# Patient Record
Sex: Female | Born: 1975 | Race: Black or African American | Hispanic: No | Marital: Single | Smoking: Never smoker
Health system: Southern US, Community
[De-identification: ages and names within clinical notes are randomized; demographics above are authoritative.]

## PROBLEM LIST (undated history)

## (undated) DIAGNOSIS — B009 Herpesviral infection, unspecified: Secondary | ICD-10-CM

## (undated) DIAGNOSIS — E039 Hypothyroidism, unspecified: Secondary | ICD-10-CM

## (undated) DIAGNOSIS — R202 Paresthesia of skin: Secondary | ICD-10-CM

## (undated) DIAGNOSIS — R001 Bradycardia, unspecified: Secondary | ICD-10-CM

## (undated) DIAGNOSIS — R519 Headache, unspecified: Secondary | ICD-10-CM

## (undated) DIAGNOSIS — J45909 Unspecified asthma, uncomplicated: Secondary | ICD-10-CM

## (undated) DIAGNOSIS — D649 Anemia, unspecified: Secondary | ICD-10-CM

## (undated) DIAGNOSIS — E611 Iron deficiency: Secondary | ICD-10-CM

## (undated) DIAGNOSIS — G43909 Migraine, unspecified, not intractable, without status migrainosus: Secondary | ICD-10-CM

## (undated) DIAGNOSIS — I4891 Unspecified atrial fibrillation: Secondary | ICD-10-CM

## (undated) DIAGNOSIS — R87629 Unspecified abnormal cytological findings in specimens from vagina: Secondary | ICD-10-CM

## (undated) DIAGNOSIS — R51 Headache: Secondary | ICD-10-CM

## (undated) DIAGNOSIS — R000 Tachycardia, unspecified: Secondary | ICD-10-CM

## (undated) DIAGNOSIS — E559 Vitamin D deficiency, unspecified: Secondary | ICD-10-CM

## (undated) HISTORY — DX: Iron deficiency: E61.1

## (undated) HISTORY — DX: Migraine, unspecified, not intractable, without status migrainosus: G43.909

## (undated) HISTORY — DX: Headache: R51

## (undated) HISTORY — DX: Headache, unspecified: R51.9

## (undated) HISTORY — DX: Anemia, unspecified: D64.9

## (undated) HISTORY — PX: OTHER SURGICAL HISTORY: SHX169

## (undated) HISTORY — DX: Hypothyroidism, unspecified: E03.9

## (undated) HISTORY — DX: Vitamin D deficiency, unspecified: E55.9

## (undated) HISTORY — DX: Paresthesia of skin: R20.2

## (undated) HISTORY — DX: Unspecified atrial fibrillation: I48.91

## (undated) HISTORY — DX: Bradycardia, unspecified: R00.1

---

## 2013-05-22 ENCOUNTER — Other Ambulatory Visit: Payer: Self-pay | Admitting: Family Medicine

## 2013-05-22 DIAGNOSIS — N6323 Unspecified lump in the left breast, lower outer quadrant: Secondary | ICD-10-CM

## 2013-05-25 ENCOUNTER — Encounter: Payer: Self-pay | Admitting: Gynecology

## 2013-05-29 ENCOUNTER — Ambulatory Visit
Admission: RE | Admit: 2013-05-29 | Discharge: 2013-05-29 | Disposition: A | Discharge status: Home / Self Care | Payer: PRIVATE HEALTH INSURANCE | Source: Ambulatory Visit | Attending: Family Medicine | Admitting: Family Medicine

## 2013-05-29 DIAGNOSIS — N6323 Unspecified lump in the left breast, lower outer quadrant: Secondary | ICD-10-CM

## 2013-06-05 ENCOUNTER — Other Ambulatory Visit: Payer: Self-pay

## 2013-08-01 ENCOUNTER — Emergency Department (HOSPITAL_BASED_OUTPATIENT_CLINIC_OR_DEPARTMENT_OTHER)
Admission: EM | Admit: 2013-08-01 | Discharge: 2013-08-01 | Disposition: A | Discharge status: Home / Self Care | Payer: PRIVATE HEALTH INSURANCE | Attending: Emergency Medicine | Admitting: Emergency Medicine

## 2013-08-01 ENCOUNTER — Encounter (HOSPITAL_BASED_OUTPATIENT_CLINIC_OR_DEPARTMENT_OTHER): Payer: Self-pay

## 2013-08-01 ENCOUNTER — Emergency Department (HOSPITAL_BASED_OUTPATIENT_CLINIC_OR_DEPARTMENT_OTHER): Payer: PRIVATE HEALTH INSURANCE

## 2013-08-01 DIAGNOSIS — Z8639 Personal history of other endocrine, nutritional and metabolic disease: Secondary | ICD-10-CM | POA: Insufficient documentation

## 2013-08-01 DIAGNOSIS — R002 Palpitations: Secondary | ICD-10-CM

## 2013-08-01 DIAGNOSIS — J45909 Unspecified asthma, uncomplicated: Secondary | ICD-10-CM | POA: Insufficient documentation

## 2013-08-01 DIAGNOSIS — R42 Dizziness and giddiness: Secondary | ICD-10-CM | POA: Insufficient documentation

## 2013-08-01 DIAGNOSIS — Z862 Personal history of diseases of the blood and blood-forming organs and certain disorders involving the immune mechanism: Secondary | ICD-10-CM | POA: Insufficient documentation

## 2013-08-01 HISTORY — DX: Unspecified asthma, uncomplicated: J45.909

## 2013-08-01 LAB — CBC WITH DIFFERENTIAL/PLATELET
Basophils Absolute: 0 10*3/uL (ref 0.0–0.1)
Basophils Relative: 1 % (ref 0–1)
Eosinophils Absolute: 0.1 10*3/uL (ref 0.0–0.7)
Eosinophils Relative: 3 % (ref 0–5)
HCT: 37.7 % (ref 36.0–46.0)
Hemoglobin: 12.7 g/dL (ref 12.0–15.0)
Lymphocytes Relative: 19 % (ref 12–46)
Lymphs Abs: 0.7 10*3/uL (ref 0.7–4.0)
MCH: 30.6 pg (ref 26.0–34.0)
MCHC: 33.7 g/dL (ref 30.0–36.0)
MCV: 90.8 fL (ref 78.0–100.0)
Monocytes Absolute: 0.5 10*3/uL (ref 0.1–1.0)
Monocytes Relative: 14 % — ABNORMAL HIGH (ref 3–12)
Neutro Abs: 2.1 10*3/uL (ref 1.7–7.7)
Neutrophils Relative %: 63 % (ref 43–77)
Platelets: 271 10*3/uL (ref 150–400)
RBC: 4.15 MIL/uL (ref 3.87–5.11)
RDW: 12.7 % (ref 11.5–15.5)
WBC: 3.4 10*3/uL — ABNORMAL LOW (ref 4.0–10.5)

## 2013-08-01 LAB — BASIC METABOLIC PANEL
BUN: 9 mg/dL (ref 6–23)
CO2: 27 mEq/L (ref 19–32)
Calcium: 9.5 mg/dL (ref 8.4–10.5)
Chloride: 101 mEq/L (ref 96–112)
Creatinine, Ser: 0.8 mg/dL (ref 0.50–1.10)
GFR calc Af Amer: >90 mL/min (ref >90)
GFR calc non Af Amer: >90 mL/min (ref >90)
Glucose, Bld: 83 mg/dL (ref 70–99)
Potassium: 3.8 mEq/L (ref 3.5–5.1)
Sodium: 136 mEq/L (ref 135–145)

## 2013-08-01 LAB — URINALYSIS, ROUTINE W REFLEX MICROSCOPIC
Bilirubin Urine: NEGATIVE
Glucose, UA: NEGATIVE mg/dL
Hgb urine dipstick: NEGATIVE
Ketones, ur: NEGATIVE mg/dL
Leukocytes, UA: NEGATIVE
Nitrite: NEGATIVE
Protein, ur: NEGATIVE mg/dL
Specific Gravity, Urine: 1.006 (ref 1.005–1.030)
Urobilinogen, UA: 0.2 mg/dL (ref 0.0–1.0)
pH: 7 (ref 5.0–8.0)

## 2013-08-01 NOTE — ED Notes (Signed)
MD at bedside. 

## 2013-08-01 NOTE — ED Notes (Signed)
Karen Sofia, PA-C at bedside 

## 2013-08-01 NOTE — ED Provider Notes (Signed)
CSN: [id number]     Arrival date & time 08/01/13  1135 History   First MD Initiated Contact with Patient 08/01/13 1154     Chief Complaint  Patient presents with  . Irregular Heart Beat  . Palpitations   (Consider location/radiation/quality/duration/timing/severity/associated sxs/prior Treatment) HPI Comments: 37 year old female who presents with a complaint of palpitations. She states that she has felt lightheaded for a few days, chief expresses about 3 weeks of dizziness which she describes as feeling like she is in a fog like she is going to lose her balance. She denies vertigo or room spinning, denies syncope and denies visual changes. She has no numbness or weakness of her arms or legs and has no changes in her mental status. Today when she was at work she stood up and felt acute onset of palpitations, this lasted for several minutes and was very uncomfortable stating that she did feel like she had an irregular heartbeat in her neck as well. She was sent here for further evaluation by the nurse at her work. She does report about one hour of intermittent palpitations 2 days ago which resolved spontaneously. She has been worked up in the past by her family doctor with a stress test, a nuclear stress test, Holter monitor testing for 24 hours none of which reveal any significant abnormalities. The patient states that she takes occasional over-the-counter medications and no stimulants, no drugs, no alcohol. She does feel that she has been drinking less fluids recently and may be somewhat dehydrated.  Patient is a 37 y.o. female presenting with palpitations. The history is provided by the patient.  Palpitations   Past Medical History  Diagnosis Date  . Thyroid disease   . Asthma    History reviewed. No pertinent past surgical history. No family history on file. History  Substance Use Topics  . Smoking status: Never Smoker   . Smokeless tobacco: Not on file  . Alcohol Use: Yes     Comment:  occasional   OB History   Grav Para Term Preterm Abortions TAB SAB Ect Mult Living                 Review of Systems  Cardiovascular: Positive for palpitations.  All other systems reviewed and are negative.    Allergies  Review of patient's allergies indicates no known allergies.  Home Medications   Current Outpatient Rx  Name  Route  Sig  Dispense  Refill  . Ascorbic Acid (VITAMIN C PO)   Oral   Take by mouth.         . Multiple Vitamins-Minerals (MULTIVITAMIN PO)   Oral   Take by mouth.          BP 116/63  Pulse 58  Temp(Src) 97.9 F (36.6 C) (Oral)  Resp 18  SpO2 100%  LMP 07/15/2013 Physical Exam  Nursing note and vitals reviewed. Constitutional: She appears well-developed and well-nourished. No distress.  HENT:  Head: Normocephalic and atraumatic.  Mouth/Throat: Oropharynx is clear and moist. No oropharyngeal exudate.  Eyes: Conjunctivae and EOM are normal. Pupils are equal, round, and reactive to light. Right eye exhibits no discharge. Left eye exhibits no discharge. No scleral icterus.  Neck: Normal range of motion. Neck supple. No JVD present. No thyromegaly present.  Cardiovascular: Normal rate, regular rhythm, normal heart sounds and intact distal pulses.  Exam reveals no gallop and no friction rub.   No murmur heard. Pulmonary/Chest: Effort normal and breath sounds normal. No respiratory distress. She has  no wheezes. She has no rales.  Abdominal: Soft. Bowel sounds are normal. She exhibits no distension and no mass. There is no tenderness.  Musculoskeletal: Normal range of motion. She exhibits no edema and no tenderness.  Lymphadenopathy:    She has no cervical adenopathy.  Neurological: She is alert. Coordination normal.  Normal speech, normal strength, normal movements, normal coordination, no nystagmus, normal cranial nerves III through XII  Skin: Skin is warm and dry. No rash noted. No erythema.  Psychiatric: She has a normal mood and affect.  Her behavior is normal.    ED Course  Procedures (including critical care time) Labs Review Labs Reviewed  CBC WITH DIFFERENTIAL - Abnormal; Notable for the following:    WBC 3.4 (*)    Monocytes Relative 14 (*)    All other components within normal limits  BASIC METABOLIC PANEL  URINALYSIS, ROUTINE W REFLEX MICROSCOPIC   Imaging Review Dg Chest 2 View  08/01/2013   *RADIOLOGY REPORT*  Clinical Data: Chest pain and shortness of breath.  History of asthma.  CHEST - 2 VIEW  Comparison: None.  Findings: The heart, mediastinal, and hilar contours are normal. The pulmonary vascularity is normal.  The trachea is midline. There is no pleural effusion or pneumothorax. There is an approximately 20 degrees convex left curvature of the lower thoracic and upper lumbar spine.  No acute osseous abnormality is identified.  IMPRESSION: 1.  No acute cardiopulmonary disease. 2.  20 degrees convex left scoliotic curvature of the lower thoracic/upper lumbar spine.   Original Report Authenticated By: Britta Mccreedy, M.D.    MDM   1. Palpitations    Overall the patient is very well-appearing from her EKG shows normal sinus rhythm without any acute findings, she has a history of no abnormalities with obstructive disease to her heart, the etiology of her palpitations is unclear at this time though I would consider elective abnormalities, dehydration which would also account for sense of lightheadedness when she stands.  ED ECG REPORT  I personally interpreted this EKG   Date: 08/01/2013   Rate: 57  Rhythm: sinus bradycardia  QRS Axis: normal  Intervals: normal  ST/T Wave abnormalities: normal  Conduction Disutrbances:none  Narrative Interpretation:   Old EKG Reviewed: none available  No more palpitations since arrival, lab work and chest x-ray reviewed without any acute findings to suggest a source. Patient stable at this time, will be discharged safely to follow up for Holter monitor  testing.     Vida Roller, MD 08/01/13 [phone]

## 2013-08-01 NOTE — ED Notes (Signed)
Pt reports feeling "light headed" for a few days, stood up at work and developed palpitations and an irregular heart beat.  Seen by nurse at work and sent to ED for evaluation.

## 2013-08-01 NOTE — ED Notes (Signed)
Family at bedside. 

## 2013-09-21 ENCOUNTER — Other Ambulatory Visit (HOSPITAL_COMMUNITY): Payer: Self-pay | Admitting: Cardiology

## 2013-09-21 DIAGNOSIS — R079 Chest pain, unspecified: Secondary | ICD-10-CM

## 2013-09-25 ENCOUNTER — Ambulatory Visit (HOSPITAL_COMMUNITY): Payer: PRIVATE HEALTH INSURANCE | Attending: Cardiology

## 2013-09-25 DIAGNOSIS — R072 Precordial pain: Secondary | ICD-10-CM | POA: Insufficient documentation

## 2013-09-25 DIAGNOSIS — I079 Rheumatic tricuspid valve disease, unspecified: Secondary | ICD-10-CM | POA: Insufficient documentation

## 2013-09-25 DIAGNOSIS — R0989 Other specified symptoms and signs involving the circulatory and respiratory systems: Secondary | ICD-10-CM | POA: Insufficient documentation

## 2013-09-25 DIAGNOSIS — I379 Nonrheumatic pulmonary valve disorder, unspecified: Secondary | ICD-10-CM | POA: Insufficient documentation

## 2013-09-25 DIAGNOSIS — R079 Chest pain, unspecified: Secondary | ICD-10-CM

## 2013-09-25 DIAGNOSIS — I059 Rheumatic mitral valve disease, unspecified: Secondary | ICD-10-CM | POA: Insufficient documentation

## 2013-09-25 DIAGNOSIS — R0609 Other forms of dyspnea: Secondary | ICD-10-CM | POA: Insufficient documentation

## 2013-09-25 DIAGNOSIS — R000 Tachycardia, unspecified: Secondary | ICD-10-CM | POA: Insufficient documentation

## 2013-09-25 NOTE — Progress Notes (Signed)
Echocardiogram performed.  

## 2013-09-26 ENCOUNTER — Encounter: Payer: Self-pay | Admitting: *Deleted

## 2013-09-26 ENCOUNTER — Encounter: Payer: Self-pay | Admitting: Cardiology

## 2013-09-26 DIAGNOSIS — E611 Iron deficiency: Secondary | ICD-10-CM | POA: Insufficient documentation

## 2013-09-26 DIAGNOSIS — E039 Hypothyroidism, unspecified: Secondary | ICD-10-CM | POA: Insufficient documentation

## 2013-09-26 DIAGNOSIS — D649 Anemia, unspecified: Secondary | ICD-10-CM | POA: Insufficient documentation

## 2013-09-27 ENCOUNTER — Encounter: Payer: Self-pay | Admitting: Cardiology

## 2013-09-28 ENCOUNTER — Ambulatory Visit (INDEPENDENT_AMBULATORY_CARE_PROVIDER_SITE_OTHER): Payer: PRIVATE HEALTH INSURANCE | Admitting: Cardiology

## 2013-09-28 ENCOUNTER — Encounter: Payer: Self-pay | Admitting: Cardiology

## 2013-09-28 VITALS — BP 108/64 | HR 60 | Ht 67.0 in | Wt 160.8 lb

## 2013-09-28 DIAGNOSIS — I4949 Other premature depolarization: Secondary | ICD-10-CM

## 2013-09-28 DIAGNOSIS — I493 Ventricular premature depolarization: Secondary | ICD-10-CM | POA: Insufficient documentation

## 2013-09-28 NOTE — Patient Instructions (Signed)
Your physician recommends that you continue on your current medications as directed. Please refer to the Current Medication list given to you today.  Your physician wants you to follow-up in: 6 months with Dr Turner You will receive a reminder letter in the mail two months in advance. If you don't receive a letter, please call our office to schedule the follow-up appointment.  

## 2013-09-28 NOTE — Progress Notes (Signed)
  [address] [place], [place]  [address] Phone: [phone] Fax:  [phone]  Date:  09/28/2013   ID:  Greenlee Ancheta, DOB 02/12/1976, MRN [id number]  PCP:  No primary provider on file.  Cardiologist:  Armanda Magic, MD     History of Present Illness: Janice Flores is a 37 y.o. female with a recent history of palpitations and SOB as well as chest tightness during the palpitations.  She underwent ETT which showed no ischemia at 12 mets with normal BP.  Her 24 hour Holter showed PVC's and she was started on Toprol which she has only been taking on a PRN basis and it has helped the 2 times she took.  The lifewatch monitor after starting Toprol has shown NSR with sinus arrhythmia.  She presents back today for followup.    Wt Readings from Last 3 Encounters:  09/28/13 160 lb 12.8 oz (72.938 kg)     Past Medical History  Diagnosis Date  . Hypothyroidism   . Asthma   . Iron deficiency   . Vitamin D deficiency   . Anemia     Current Outpatient Prescriptions  Medication Sig Dispense Refill  . albuterol (PROVENTIL HFA;VENTOLIN HFA) 108 (90 BASE) MCG/ACT inhaler Inhale 2 puffs into the lungs every 6 (six) hours as needed for wheezing.      Marland Kitchen levothyroxine (SYNTHROID, LEVOTHROID) 50 MCG tablet Take 50 mcg by mouth daily before breakfast.      . meclizine (ANTIVERT) 25 MG tablet Take 25 mg by mouth 3 (three) times daily as needed.      . metoprolol succinate (TOPROL-XL) 25 MG 24 hr tablet 1/2 tab po only as needed      . Multiple Vitamins-Minerals (MULTIVITAMIN PO) Take by mouth.      . Vitamin D, Ergocalciferol, (DRISDOL) 50000 UNITS CAPS capsule Take 50,000 Units by mouth. Twice a week       No current facility-administered medications for this visit.    Allergies:   No Known Allergies  Social History:  The patient  reports that she has never smoked. She does not have any smokeless tobacco history on file. She reports that she drinks alcohol. She reports that she does not  use illicit drugs.   Family History:  The patient's family history includes Asthma in her brother; Hypercholesterolemia in her mother; Hypertension in her father; Sarcoidosis in her mother.   ROS:  Please see the history of present illness.      All other systems reviewed and negative.    ASSESSMENT AND PLAN:  1. Palpitations with heart monitor showing NSR with sinus arrhythmia and 24 hour holter with PVC's  - continue Toprol on as as needed basis 2. 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 PVC's - continue Toprol PRN - 2D echo with normal LVF.  We discussed triggers of PVC's including fatigue, stress, caffeine and ETOH.  Followup with me in 6 months  Signed, Armanda Magic, MD 09/28/2013 4:38 PM

## 2013-10-05 DIAGNOSIS — R002 Palpitations: Secondary | ICD-10-CM

## 2013-11-07 ENCOUNTER — Other Ambulatory Visit: Payer: Self-pay | Admitting: Family Medicine

## 2013-11-07 DIAGNOSIS — N6320 Unspecified lump in the left breast, unspecified quadrant: Secondary | ICD-10-CM

## 2013-12-04 ENCOUNTER — Ambulatory Visit
Admission: RE | Admit: 2013-12-04 | Discharge: 2013-12-04 | Disposition: A | Discharge status: Home / Self Care | Payer: PRIVATE HEALTH INSURANCE | Source: Ambulatory Visit | Attending: Family Medicine | Admitting: Family Medicine

## 2013-12-04 DIAGNOSIS — N6320 Unspecified lump in the left breast, unspecified quadrant: Secondary | ICD-10-CM

## 2014-03-21 ENCOUNTER — Encounter: Payer: Self-pay | Admitting: Cardiology

## 2014-03-22 ENCOUNTER — Encounter: Payer: Self-pay | Admitting: Cardiology

## 2014-03-22 ENCOUNTER — Encounter: Payer: Self-pay | Admitting: Physician Assistant

## 2014-04-26 ENCOUNTER — Other Ambulatory Visit: Payer: Self-pay | Admitting: Family Medicine

## 2014-04-26 DIAGNOSIS — N6320 Unspecified lump in the left breast, unspecified quadrant: Secondary | ICD-10-CM

## 2014-04-26 DIAGNOSIS — D249 Benign neoplasm of unspecified breast: Secondary | ICD-10-CM

## 2014-05-30 ENCOUNTER — Ambulatory Visit
Admission: RE | Admit: 2014-05-30 | Discharge: 2014-05-30 | Disposition: A | Discharge status: Home / Self Care | Payer: PRIVATE HEALTH INSURANCE | Source: Ambulatory Visit | Attending: Family Medicine | Admitting: Family Medicine

## 2014-05-30 DIAGNOSIS — D249 Benign neoplasm of unspecified breast: Secondary | ICD-10-CM

## 2014-05-30 DIAGNOSIS — N6320 Unspecified lump in the left breast, unspecified quadrant: Secondary | ICD-10-CM

## 2014-09-06 LAB — OB RESULTS CONSOLE RPR: RPR: NONREACTIVE

## 2014-09-06 LAB — OB RESULTS CONSOLE GC/CHLAMYDIA
Chlamydia: NEGATIVE
Gonorrhea: NEGATIVE

## 2014-09-06 LAB — OB RESULTS CONSOLE ANTIBODY SCREEN: Antibody Screen: NEGATIVE

## 2014-09-06 LAB — OB RESULTS CONSOLE HEPATITIS B SURFACE ANTIGEN: Hepatitis B Surface Ag: NEGATIVE

## 2014-09-06 LAB — OB RESULTS CONSOLE ABO/RH: RH Type: POSITIVE

## 2014-09-06 LAB — OB RESULTS CONSOLE RUBELLA ANTIBODY, IGM: Rubella: IMMUNE

## 2014-09-06 LAB — OB RESULTS CONSOLE HIV ANTIBODY (ROUTINE TESTING): HIV: NONREACTIVE

## 2014-09-26 ENCOUNTER — Other Ambulatory Visit: Payer: Self-pay | Admitting: Obstetrics and Gynecology

## 2014-09-26 ENCOUNTER — Other Ambulatory Visit (HOSPITAL_COMMUNITY)
Admission: RE | Admit: 2014-09-26 | Discharge: 2014-09-26 | Disposition: A | Discharge status: Home / Self Care | Payer: Commercial Managed Care - PPO | Source: Ambulatory Visit | Attending: Obstetrics and Gynecology | Admitting: Obstetrics and Gynecology

## 2014-09-26 DIAGNOSIS — Z113 Encounter for screening for infections with a predominantly sexual mode of transmission: Secondary | ICD-10-CM | POA: Insufficient documentation

## 2014-09-26 DIAGNOSIS — Z1151 Encounter for screening for human papillomavirus (HPV): Secondary | ICD-10-CM | POA: Insufficient documentation

## 2014-09-26 DIAGNOSIS — Z01411 Encounter for gynecological examination (general) (routine) with abnormal findings: Secondary | ICD-10-CM | POA: Diagnosis present

## 2014-09-28 LAB — CYTOLOGY - PAP

## 2015-01-16 ENCOUNTER — Encounter: Payer: Self-pay | Admitting: Physician Assistant

## 2015-01-16 ENCOUNTER — Ambulatory Visit (INDEPENDENT_AMBULATORY_CARE_PROVIDER_SITE_OTHER): Payer: Commercial Managed Care - PPO | Admitting: Physician Assistant

## 2015-01-16 VITALS — BP 115/68 | HR 92 | Wt 191.6 lb

## 2015-01-16 DIAGNOSIS — I493 Ventricular premature depolarization: Secondary | ICD-10-CM

## 2015-01-16 DIAGNOSIS — R0602 Shortness of breath: Secondary | ICD-10-CM | POA: Diagnosis not present

## 2015-01-16 NOTE — Patient Instructions (Signed)
Your physician recommends that you continue on your current medications as directed. Please refer to the Current Medication list given to you today.  Your physician has requested that you have an echocardiogram. Echocardiography is a painless test that uses sound waves to create images of your heart. It provides your doctor with information about the size and shape of your heart and how well your heart's chambers and valves are working. This procedure takes approximately one hour. There are no restrictions for this procedure.  Your physician recommends that you schedule a follow-up appointment in: 4 to 6 weeks with Dr. Radford Pax.

## 2015-01-16 NOTE — Progress Notes (Signed)
Cardiology Office Note   Date:  01/16/2015   ID:  Janice Flores, Janice Flores 09/23/1976, MRN [id number]  PCP:  Lynne Logan, MD  Cardiologist:  Dr. Radford Pax    SOB     History of Present Illness: Janice Flores is a 39 y.o. female with a history of asthma, hypothyroidism, anemia and palpations who was added on to my office schedule today for evaluation of SOB.   She was last seen in the office by Dr. Radford Pax in 09/2013 for evaluation of palpitations and SOB as well as chest tightness during the palpitations. She underwent ETT which showed no ischemia at 12 mets with normal BP. Her 24 hour Holter showed PVC's and she was started on Toprol which she has only been taking on a PRN basis and was helpful. The lifewatch monitor after starting Toprol has shown NSR with sinus arrhythmia.   She had not needed to take Toprol since last summer. However, she did have one episode of palpations in late September but decided not to take it as she was pregnant. The patient is now [redacted] weeks pregnant and was sent over from her OB/GYN for evaluation of SOB. She notices it with very light exertion and when laying on her left side to sleep. She is also having palpitations over the past 2 weeks about 5-6 times. No Le edema, PND or orthopnea. No dizziness or lightheadedness or dizziness. No blood in her stool or urine. No recent fevers, chills, night sweats, or illnesses. Still with some morning sickness that has gotten a little better. She takes Diclegis ( combo of unisom and vit B6)  She does not drink any alcohol or caffeine.    Past Medical History  Diagnosis Date  . Hypothyroidism   . Asthma   . Iron deficiency   . Vitamin D deficiency   . Anemia     Past Surgical History  Procedure Laterality Date  . Removal of polyp from uterus       Current Outpatient Prescriptions  Medication Sig Dispense Refill  . albuterol (PROVENTIL HFA;VENTOLIN HFA) 108 (90 BASE) MCG/ACT inhaler Inhale 2 puffs into the lungs every  6 (six) hours as needed for wheezing.    Marland Kitchen levothyroxine (SYNTHROID, LEVOTHROID) 50 MCG tablet Take 50 mcg by mouth daily before breakfast.    . Multiple Vitamins-Minerals (MULTIVITAMIN PO) Take 1 tablet by mouth daily.      No current facility-administered medications for this visit.    Allergies:   Review of patient's allergies indicates no known allergies.    Social History:  The patient  reports that she has never smoked. She does not have any smokeless tobacco history on file. She reports that she drinks alcohol. She reports that she does not use illicit drugs.   Family History:  The patient's family history includes Asthma in her brother; Hypercholesterolemia in her mother; Hypertension in her father; Sarcoidosis in her mother.    ROS:  Please see the history of present illness.  All other systems are reviewed and negative.    PHYSICAL EXAM: VS:  BP 115/68 mmHg  Pulse 92  Wt 191 lb 9.6 oz (86.909 kg)  SpO2 99% , BMI Body mass index is 30 kg/(m^2). GEN: Well nourished, well developed, in no acute distress HEENT: normal Neck: no JVD, carotid bruits, or masses Cardiac: RRR; no murmurs, rubs, or gallops,no edema  Respiratory:  clear to auscultation bilaterally, normal work of breathing GI: soft, nontender, nondistended, + BS MS: no deformity or atrophy Skin:  warm and dry, no rash Neuro:  Strength and sensation are intact Psych: euthymic mood, full affect   EKG:  EKG is ordered today. The ekg ordered today demonstrates NSR HR 92. No PVCS   Recent Labs: No results found for requested labs within last 365 days.    Lipid Panel No results found for: CHOL, TRIG, HDL, CHOLHDL, VLDL, LDLCALC, LDLDIRECT    Wt Readings from Last 3 Encounters:  01/16/15 191 lb 9.6 oz (86.909 kg)  09/28/13 160 lb 12.8 oz (72.938 kg)      Other studies Reviewed: Additional studies/ records that were reviewed today include: 2D ECHO, 24 hours holter Review of the above records demonstrates:  normal LV function and PVCs.   ASSESSMENT AND PLAN:  Janice Flores is a 39 y.o. female with a history of asthma, hypothyroidism, anemia and palpations who was added on to my office schedule today for evaluation of SOB.  SOB- related to exertion and palpitations.  -- 2D echo with normal LVF in 2014. Will get repeat ECHO with return of SOB and current pregnancy to rule out new cardiomyopathy. No s/s CHF.  PVC's - was previously on Toprol PRN but this has been discontinued due to current pregnancy -- She has had return of palpations and SOB but chooses not to take metoprolol XL as she is pregnant and the symptoms are not that bothersome. -- We discussed triggers of PVC's including fatigue, stress, caffeine and ETOH.  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   Current medicines are reviewed at length with the patient today.  The patient does not have concerns regarding medicines.  The following changes have been made:  no change  Labs/ tests ordered today include: 2D ECHO   No orders of the defined types were placed in this encounter.     Disposition:   FU with Dr. Radford Pax in 4-6 weeks.   Renea Ee  01/16/2015 3:18 PM    [facility] [place], [place], [place]  [address] Phone: [phone]; Fax: [phone]

## 2015-01-21 ENCOUNTER — Ambulatory Visit (HOSPITAL_COMMUNITY): Payer: Commercial Managed Care - PPO | Attending: Cardiovascular Disease | Admitting: Cardiology

## 2015-01-21 DIAGNOSIS — R0602 Shortness of breath: Secondary | ICD-10-CM | POA: Diagnosis present

## 2015-01-21 NOTE — Progress Notes (Signed)
Echo performed. 

## 2015-02-21 NOTE — Progress Notes (Signed)
Cardiology Office Note   Date:  02/22/2015   ID:  Janice Flores, DOB [birth year], MRN [id number]  PCP:  Janice Logan, MD  Cardiologist:   Janice Margarita, MD   Chief Complaint  Patient presents with  . Palpitations      History of Present Illness: Janice Flores is a 39 y.o. female with a  history of palpitations and SOB as well as chest tightness during the palpitations. She underwent ETT which showed no ischemia at 12 mets with normal BP. Her 24 hour Holter showed PVC's and she was started on Toprol which she only  takes on a PRN basis. The lifewatch monitor after starting Toprol showed NSR with sinus arrhythmia. She presents back today for followup.  She saw my PA a few weeks ago due to SOB.  She is now [redacted] weeks pregnant.  The SOB was noticeable with light exertion and when laying on her left side to sleep.  She has also been having some palpitations.  She continues to have the palpitations.  2D echo was completely normal.  She anemic and is taking iron suppl.  She is not drinking any caffeine.  She says that the SOB is about the same with walking and gets up from sitting.  She denies any LE edema.     Past Medical History  Diagnosis Date  . Hypothyroidism   . Asthma   . Iron deficiency   . Vitamin D deficiency   . Anemia     Past Surgical History  Procedure Laterality Date  . Removal of polyp from uterus       Current Outpatient Prescriptions  Medication Sig Dispense Refill  . albuterol (PROVENTIL HFA;VENTOLIN HFA) 108 (90 BASE) MCG/ACT inhaler Inhale 2 puffs into the lungs every 6 (six) hours as needed for wheezing.    Marland Kitchen levothyroxine (SYNTHROID, LEVOTHROID) 75 MCG tablet Take 75 mcg by mouth every morning.  3  . Multiple Vitamins-Minerals (MULTIVITAMIN PO) Take 1 tablet by mouth daily.      No current facility-administered medications for this visit.    Allergies:   Review of patient's allergies indicates no known allergies.    Social History:  The patient   reports that she has never smoked. She does not have any smokeless tobacco history on file. She reports that she drinks alcohol. She reports that she does not use illicit drugs.   Family History:  The patient's family history includes Asthma in her brother; Hypercholesterolemia in her mother; Hypertension in her father; Sarcoidosis in her mother.    ROS:  Please see the history of present illness.   Otherwise, review of systems are positive for none.   All other systems are reviewed and negative.    PHYSICAL EXAM: VS:  BP 118/60 mmHg  Pulse 95  Ht 5\' 7"  (1.702 m)  Wt 196 lb (88.905 kg)  BMI 30.69 kg/m2 , BMI Body mass index is 30.69 kg/(m^2). GEN: Well nourished, well developed, in no acute distress HEENT: normal Neck: no JVD, carotid bruits, or masses Cardiac: RRR; no murmurs, rubs, or gallops,no edema  Respiratory:  clear to auscultation bilaterally, normal work of breathing GI: soft, nontender, nondistended, + BS MS: no deformity or atrophy Skin: warm and dry, no rash Neuro:  Strength and sensation are intact Psych: euthymic mood, full affect   EKG:  EKG is not ordered today.    Recent Labs: No results found for requested labs within last 365 days.    Lipid Panel No results  found for: CHOL, TRIG, HDL, CHOLHDL, VLDL, LDLCALC, LDLDIRECT    Wt Readings from Last 3 Encounters:  02/22/15 196 lb (88.905 kg)  01/16/15 191 lb 9.6 oz (86.909 kg)  09/28/13 160 lb 12.8 oz (72.938 kg)     ASSESSMENT AND PLAN:    1.       Palpitations - I will get a heart monitor to assess since these feel different from her typical PVC's - had been using Toprol PRN but not now since she is pregnant 2. SOB with normal 2D echo.  I suspect this is a combination of pregnancy and anemia.  No further cardiac workup at this time   3.       PVC's  - 2D echo with normal LVF.  Current medicines are reviewed at length with the patient today.  The patient does not have concerns regarding  medicines.  The following changes have been made:  no change  Labs/ tests ordered today include: None   Orders Placed This Encounter  Procedures  . Cardiac event monitor     Disposition:   FU with me in 3 months   Signed, Janice Margarita, MD  02/22/2015 3:31 PM    [facility] [place], [place], [place]  [address] Phone: [phone]; Fax: [phone]

## 2015-02-22 ENCOUNTER — Encounter: Payer: Self-pay | Admitting: Cardiology

## 2015-02-22 ENCOUNTER — Ambulatory Visit (INDEPENDENT_AMBULATORY_CARE_PROVIDER_SITE_OTHER): Payer: Commercial Managed Care - PPO | Admitting: Cardiology

## 2015-02-22 VITALS — BP 118/60 | HR 95 | Ht 67.0 in | Wt 196.0 lb

## 2015-02-22 DIAGNOSIS — R0602 Shortness of breath: Secondary | ICD-10-CM

## 2015-02-22 DIAGNOSIS — R002 Palpitations: Secondary | ICD-10-CM | POA: Diagnosis not present

## 2015-02-22 DIAGNOSIS — I493 Ventricular premature depolarization: Secondary | ICD-10-CM

## 2015-02-22 NOTE — Patient Instructions (Addendum)
Your physician has recommended that you wear an event monitor. Event monitors are medical devices that record the heart's electrical activity. Doctors most often [place] these monitors to diagnose arrhythmias. Arrhythmias are problems with the speed or rhythm of the heartbeat. The monitor is a small, portable device. You can wear one while you do your normal daily activities. This is usually used to diagnose what is causing palpitations/syncope (passing out).  Your physician recommends that you schedule a follow-up appointment in: 3 months with Dr. Radford Pax.

## 2015-02-26 ENCOUNTER — Encounter: Payer: Self-pay | Admitting: Radiology

## 2015-02-26 ENCOUNTER — Encounter (INDEPENDENT_AMBULATORY_CARE_PROVIDER_SITE_OTHER): Payer: Commercial Managed Care - PPO

## 2015-02-26 DIAGNOSIS — R002 Palpitations: Secondary | ICD-10-CM

## 2015-02-26 DIAGNOSIS — I493 Ventricular premature depolarization: Secondary | ICD-10-CM

## 2015-02-26 NOTE — Progress Notes (Signed)
Patient ID: Janice Flores, female   DOB: 28-Mar-1976, 39 y.o.   MRN: [id number] Lifewatch 30 day monitor applied. EOS 03-28-15

## 2015-03-19 LAB — OB RESULTS CONSOLE GBS: GBS: POSITIVE

## 2015-04-02 ENCOUNTER — Telehealth: Payer: Self-pay | Admitting: Cardiology

## 2015-04-02 NOTE — Telephone Encounter (Signed)
Please let patient know that heart monitor showed NSR and sinus tachycardia up to 108 bpm with PVC's which are benign.

## 2015-04-04 NOTE — Telephone Encounter (Signed)
Instructed patient to HOLD medication if she is planning to breastfeed. Patient grateful for callback and agrees with treatment plan.

## 2015-04-04 NOTE — Telephone Encounter (Signed)
Would hold off if she is planning to breast feed

## 2015-04-04 NOTE — Telephone Encounter (Signed)
Informed patient of results and verbal understanding expressed.   Patient st she still has seldom palpitations. She has not taken her metoprolol since last summer. She wants to know if it is OK to resume PRN metoprolol for symptoms once the baby arrives.   To Dr. Radford Pax.

## 2015-04-17 ENCOUNTER — Telehealth (HOSPITAL_COMMUNITY): Payer: Self-pay | Admitting: *Deleted

## 2015-04-17 ENCOUNTER — Encounter (HOSPITAL_COMMUNITY): Payer: Self-pay | Admitting: *Deleted

## 2015-04-17 NOTE — Telephone Encounter (Signed)
Preadmission screen  

## 2015-04-21 ENCOUNTER — Encounter (HOSPITAL_COMMUNITY): Payer: Self-pay | Admitting: Obstetrics and Gynecology

## 2015-04-21 ENCOUNTER — Inpatient Hospital Stay (HOSPITAL_COMMUNITY)
Admission: AD | Admit: 2015-04-21 | Discharge: 2015-04-22 | DRG: 765 | Disposition: A | Discharge status: Home / Self Care | Payer: Commercial Managed Care - PPO | Source: Ambulatory Visit | Attending: Obstetrics and Gynecology | Admitting: Obstetrics and Gynecology

## 2015-04-21 ENCOUNTER — Inpatient Hospital Stay (HOSPITAL_COMMUNITY): Payer: Commercial Managed Care - PPO | Admitting: Anesthesiology

## 2015-04-21 ENCOUNTER — Inpatient Hospital Stay (HOSPITAL_COMMUNITY): Admission: RE | Admit: 2015-04-21 | Payer: Commercial Managed Care - PPO | Source: Ambulatory Visit

## 2015-04-21 ENCOUNTER — Encounter (HOSPITAL_COMMUNITY)
Admission: AD | Disposition: A | Discharge status: Home / Self Care | Payer: Self-pay | Source: Ambulatory Visit | Attending: Obstetrics and Gynecology

## 2015-04-21 DIAGNOSIS — R0602 Shortness of breath: Secondary | ICD-10-CM

## 2015-04-21 DIAGNOSIS — D509 Iron deficiency anemia, unspecified: Secondary | ICD-10-CM | POA: Diagnosis present

## 2015-04-21 DIAGNOSIS — E039 Hypothyroidism, unspecified: Secondary | ICD-10-CM | POA: Diagnosis present

## 2015-04-21 DIAGNOSIS — O99284 Endocrine, nutritional and metabolic diseases complicating childbirth: Secondary | ICD-10-CM | POA: Diagnosis present

## 2015-04-21 DIAGNOSIS — O9942 Diseases of the circulatory system complicating childbirth: Secondary | ICD-10-CM | POA: Diagnosis present

## 2015-04-21 DIAGNOSIS — O09513 Supervision of elderly primigravida, third trimester: Secondary | ICD-10-CM | POA: Diagnosis not present

## 2015-04-21 DIAGNOSIS — O9832 Other infections with a predominantly sexual mode of transmission complicating childbirth: Secondary | ICD-10-CM | POA: Diagnosis present

## 2015-04-21 DIAGNOSIS — O9902 Anemia complicating childbirth: Secondary | ICD-10-CM | POA: Diagnosis present

## 2015-04-21 DIAGNOSIS — O9989 Other specified diseases and conditions complicating pregnancy, childbirth and the puerperium: Secondary | ICD-10-CM | POA: Diagnosis present

## 2015-04-21 DIAGNOSIS — B951 Streptococcus, group B, as the cause of diseases classified elsewhere: Secondary | ICD-10-CM | POA: Insufficient documentation

## 2015-04-21 DIAGNOSIS — O09519 Supervision of elderly primigravida, unspecified trimester: Secondary | ICD-10-CM | POA: Insufficient documentation

## 2015-04-21 DIAGNOSIS — Z3A40 40 weeks gestation of pregnancy: Secondary | ICD-10-CM | POA: Diagnosis present

## 2015-04-21 DIAGNOSIS — O99824 Streptococcus B carrier state complicating childbirth: Secondary | ICD-10-CM | POA: Diagnosis present

## 2015-04-21 DIAGNOSIS — R002 Palpitations: Secondary | ICD-10-CM

## 2015-04-21 DIAGNOSIS — E559 Vitamin D deficiency, unspecified: Secondary | ICD-10-CM | POA: Diagnosis present

## 2015-04-21 DIAGNOSIS — A6000 Herpesviral infection of urogenital system, unspecified: Secondary | ICD-10-CM | POA: Insufficient documentation

## 2015-04-21 DIAGNOSIS — I493 Ventricular premature depolarization: Secondary | ICD-10-CM | POA: Diagnosis present

## 2015-04-21 DIAGNOSIS — R000 Tachycardia, unspecified: Secondary | ICD-10-CM | POA: Diagnosis present

## 2015-04-21 DIAGNOSIS — O480 Post-term pregnancy: Secondary | ICD-10-CM | POA: Diagnosis present

## 2015-04-21 DIAGNOSIS — Z98891 History of uterine scar from previous surgery: Secondary | ICD-10-CM

## 2015-04-21 DIAGNOSIS — E611 Iron deficiency: Secondary | ICD-10-CM

## 2015-04-21 HISTORY — DX: Herpesviral infection, unspecified: B00.9

## 2015-04-21 HISTORY — DX: Tachycardia, unspecified: R00.0

## 2015-04-21 HISTORY — DX: Unspecified abnormal cytological findings in specimens from vagina: R87.629

## 2015-04-21 LAB — CBC
HCT: 34.3 % — ABNORMAL LOW (ref 36.0–46.0)
HCT: 30.3 % — ABNORMAL LOW (ref 36.0–46.0)
Hemoglobin: 12.3 g/dL (ref 12.0–15.0)
Hemoglobin: 10.5 g/dL — ABNORMAL LOW (ref 12.0–15.0)
MCH: 32.5 pg (ref 26.0–34.0)
MCH: 31.5 pg (ref 26.0–34.0)
MCHC: 35.9 g/dL (ref 30.0–36.0)
MCHC: 34.7 g/dL (ref 30.0–36.0)
MCV: 90.5 fL (ref 78.0–100.0)
MCV: 91 fL (ref 78.0–100.0)
Platelets: 310 10*3/uL (ref 150–400)
Platelets: 251 10*3/uL (ref 150–400)
RBC: 3.79 MIL/uL — ABNORMAL LOW (ref 3.87–5.11)
RBC: 3.33 MIL/uL — ABNORMAL LOW (ref 3.87–5.11)
RDW: 15.8 % — ABNORMAL HIGH (ref 11.5–15.5)
RDW: 15.8 % — ABNORMAL HIGH (ref 11.5–15.5)
WBC: 8.6 10*3/uL (ref 4.0–10.5)
WBC: 12.4 10*3/uL — ABNORMAL HIGH (ref 4.0–10.5)

## 2015-04-21 LAB — COMPREHENSIVE METABOLIC PANEL
ALT: 13 U/L — ABNORMAL LOW (ref 14–54)
AST: 21 U/L (ref 15–41)
Albumin: 2.7 g/dL — ABNORMAL LOW (ref 3.5–5.0)
Alkaline Phosphatase: 209 U/L — ABNORMAL HIGH (ref 38–126)
Anion gap: 10 (ref 5–15)
BUN: 6 mg/dL (ref 6–20)
CO2: 23 mmol/L (ref 22–32)
Calcium: 8.2 mg/dL — ABNORMAL LOW (ref 8.9–10.3)
Chloride: 102 mmol/L (ref 101–111)
Creatinine, Ser: 0.76 mg/dL (ref 0.44–1.00)
GFR calc Af Amer: >60 mL/min (ref >60)
GFR calc non Af Amer: >60 mL/min (ref >60)
Glucose, Bld: 90 mg/dL (ref 65–99)
Potassium: 3.5 mmol/L (ref 3.5–5.1)
Sodium: 135 mmol/L (ref 135–145)
Total Bilirubin: 0.5 mg/dL (ref 0.3–1.2)
Total Protein: 6 g/dL — ABNORMAL LOW (ref 6.5–8.1)

## 2015-04-21 LAB — TYPE AND SCREEN
ABO/RH(D): B POS
Antibody Screen: NEGATIVE

## 2015-04-21 LAB — LACTATE DEHYDROGENASE: LDH: 119 U/L (ref 98–192)

## 2015-04-21 LAB — URIC ACID: Uric Acid, Serum: 4.6 mg/dL (ref 2.3–6.6)

## 2015-04-21 SURGERY — Surgical Case
Anesthesia: Epidural | Site: Abdomen

## 2015-04-21 MED ORDER — LACTATED RINGERS IV SOLN
500.0000 mL | INTRAVENOUS | Status: DC | PRN
  Administered 2015-04-21: 1000 mL via INTRAVENOUS
  Administered 2015-04-21: 500 mL via INTRAVENOUS

## 2015-04-21 MED ORDER — KETOROLAC TROMETHAMINE 30 MG/ML IJ SOLN: 30.0000 mg | Freq: Four times a day (QID) | INTRAMUSCULAR | Status: DC | PRN

## 2015-04-21 MED ORDER — LIDOCAINE HCL (PF) 1 % IJ SOLN
INTRAMUSCULAR | Status: DC | PRN
  Administered 2015-04-21: 3 mL
  Administered 2015-04-21 (×2): 5 mL

## 2015-04-21 MED ORDER — LACTATED RINGERS IV SOLN
INTRAVENOUS | Status: DC
  Administered 2015-04-21: 20:00:00 via INTRAUTERINE

## 2015-04-21 MED ORDER — FENTANYL 2.5 MCG/ML BUPIVACAINE 1/10 % EPIDURAL INFUSION (WH - ANES)
INTRAMUSCULAR | Status: AC
  Filled 2015-04-21: qty 125

## 2015-04-21 MED ORDER — FENTANYL CITRATE (PF) 100 MCG/2ML IJ SOLN: 25.0000 ug | INTRAMUSCULAR | Status: DC | PRN

## 2015-04-21 MED ORDER — KETOROLAC TROMETHAMINE 30 MG/ML IJ SOLN
30.0000 mg | Freq: Four times a day (QID) | INTRAMUSCULAR | Status: DC | PRN
  Administered 2015-04-21: 30 mg via INTRAVENOUS

## 2015-04-21 MED ORDER — OXYTOCIN 40 UNITS IN LACTATED RINGERS INFUSION - SIMPLE MED
1.0000 m[IU]/min | INTRAVENOUS | Status: DC
  Administered 2015-04-21: 2 m[IU]/min via INTRAVENOUS
  Filled 2015-04-21: qty 1000

## 2015-04-21 MED ORDER — TERBUTALINE SULFATE 1 MG/ML IJ SOLN
0.2500 mg | Freq: Once | INTRAMUSCULAR | Status: AC | PRN
Start: 2015-04-21 — End: 2015-04-21
  Administered 2015-04-21: 0.25 mg via SUBCUTANEOUS
  Filled 2015-04-21: qty 1

## 2015-04-21 MED ORDER — SODIUM BICARBONATE 8.4 % IV SOLN
INTRAVENOUS | Status: DC | PRN
  Administered 2015-04-21: 5 mL via EPIDURAL

## 2015-04-21 MED ORDER — PENICILLIN G POTASSIUM 5000000 UNITS IJ SOLR
2.5000 10*6.[IU] | INTRAVENOUS | Status: DC
  Administered 2015-04-21: 2.5 10*6.[IU] via INTRAVENOUS
  Filled 2015-04-21 (×6): qty 2.5

## 2015-04-21 MED ORDER — FLEET ENEMA 7-19 GM/118ML RE ENEM: 1.0000 | ENEMA | RECTAL | Status: DC | PRN

## 2015-04-21 MED ORDER — LACTATED RINGERS IV SOLN
INTRAVENOUS | Status: DC
  Administered 2015-04-21: 15:00:00 via INTRAVENOUS

## 2015-04-21 MED ORDER — FENTANYL 2.5 MCG/ML BUPIVACAINE 1/10 % EPIDURAL INFUSION (WH - ANES)
14.0000 mL/h | INTRAMUSCULAR | Status: DC | PRN
  Administered 2015-04-21 (×2): 14 mL/h via EPIDURAL
  Filled 2015-04-21: qty 125

## 2015-04-21 MED ORDER — ACETAMINOPHEN 325 MG PO TABS: 650.0000 mg | ORAL_TABLET | ORAL | Status: DC | PRN

## 2015-04-21 MED ORDER — ONDANSETRON HCL 4 MG/2ML IJ SOLN: 4.0000 mg | Freq: Four times a day (QID) | INTRAMUSCULAR | Status: DC | PRN

## 2015-04-21 MED ORDER — OXYCODONE-ACETAMINOPHEN 5-325 MG PO TABS: 2.0000 | ORAL_TABLET | ORAL | Status: DC | PRN

## 2015-04-21 MED ORDER — OXYTOCIN 10 UNIT/ML IJ SOLN
40.0000 [IU] | INTRAVENOUS | Status: DC | PRN
  Administered 2015-04-21: 40 [IU] via INTRAVENOUS

## 2015-04-21 MED ORDER — PENICILLIN G POTASSIUM 5000000 UNITS IJ SOLR
5.0000 10*6.[IU] | Freq: Once | INTRAVENOUS | Status: AC
  Administered 2015-04-21: 5 10*6.[IU] via INTRAVENOUS
  Filled 2015-04-21: qty 5

## 2015-04-21 MED ORDER — ONDANSETRON HCL 4 MG/2ML IJ SOLN
INTRAMUSCULAR | Status: DC | PRN
  Administered 2015-04-21: 4 mg via INTRAVENOUS

## 2015-04-21 MED ORDER — LACTATED RINGERS IV SOLN
INTRAVENOUS | Status: DC | PRN
  Administered 2015-04-21 (×2): via INTRAVENOUS

## 2015-04-21 MED ORDER — OXYCODONE-ACETAMINOPHEN 5-325 MG PO TABS: 1.0000 | ORAL_TABLET | ORAL | Status: DC | PRN

## 2015-04-21 MED ORDER — DIPHENHYDRAMINE HCL 50 MG/ML IJ SOLN: 12.5000 mg | INTRAMUSCULAR | Status: DC | PRN

## 2015-04-21 MED ORDER — CEFAZOLIN SODIUM-DEXTROSE 2-3 GM-% IV SOLR
INTRAVENOUS | Status: DC | PRN
  Administered 2015-04-21: 2 g via INTRAVENOUS

## 2015-04-21 MED ORDER — LACTATED RINGERS IV SOLN: INTRAVENOUS | Status: DC

## 2015-04-21 MED ORDER — FENTANYL CITRATE (PF) 100 MCG/2ML IJ SOLN
INTRAMUSCULAR | Status: AC
  Filled 2015-04-21: qty 2

## 2015-04-21 MED ORDER — PHENYLEPHRINE 40 MCG/ML (10ML) SYRINGE FOR IV PUSH (FOR BLOOD PRESSURE SUPPORT)
PREFILLED_SYRINGE | INTRAVENOUS | Status: AC
  Filled 2015-04-21: qty 20

## 2015-04-21 MED ORDER — BUTORPHANOL TARTRATE 1 MG/ML IJ SOLN: 1.0000 mg | INTRAMUSCULAR | Status: DC | PRN

## 2015-04-21 MED ORDER — MEPERIDINE HCL 25 MG/ML IJ SOLN
INTRAMUSCULAR | Status: AC
  Filled 2015-04-21: qty 1

## 2015-04-21 MED ORDER — CITRIC ACID-SODIUM CITRATE 334-500 MG/5ML PO SOLN
30.0000 mL | ORAL | Status: DC | PRN
  Administered 2015-04-21: 30 mL via ORAL
  Filled 2015-04-21: qty 15

## 2015-04-21 MED ORDER — MEPERIDINE HCL 25 MG/ML IJ SOLN: 6.2500 mg | INTRAMUSCULAR | Status: DC | PRN

## 2015-04-21 MED ORDER — ZOLPIDEM TARTRATE 5 MG PO TABS: 5.0000 mg | ORAL_TABLET | Freq: Every evening | ORAL | Status: DC | PRN

## 2015-04-21 MED ORDER — LIDOCAINE HCL (PF) 1 % IJ SOLN: 30.0000 mL | INTRAMUSCULAR | Status: DC | PRN

## 2015-04-21 MED ORDER — OXYTOCIN 40 UNITS IN LACTATED RINGERS INFUSION - SIMPLE MED: 62.5000 mL/h | INTRAVENOUS | Status: DC

## 2015-04-21 MED ORDER — KETOROLAC TROMETHAMINE 30 MG/ML IJ SOLN
INTRAMUSCULAR | Status: AC
  Filled 2015-04-21: qty 1

## 2015-04-21 MED ORDER — MORPHINE SULFATE (PF) 0.5 MG/ML IJ SOLN
INTRAMUSCULAR | Status: DC | PRN
  Administered 2015-04-21: .5 mg via INTRAVENOUS
  Administered 2015-04-21: 4 mg via EPIDURAL
  Administered 2015-04-21: .5 mg via INTRAVENOUS

## 2015-04-21 MED ORDER — MEPERIDINE HCL 25 MG/ML IJ SOLN
INTRAMUSCULAR | Status: DC | PRN
  Administered 2015-04-21 (×2): 12.5 mg via INTRAVENOUS

## 2015-04-21 MED ORDER — LABETALOL HCL 5 MG/ML IV SOLN
INTRAVENOUS | Status: DC | PRN
  Administered 2015-04-21: 10 mg via INTRAVENOUS

## 2015-04-21 MED ORDER — OXYTOCIN BOLUS FROM INFUSION: 500.0000 mL | INTRAVENOUS | Status: DC

## 2015-04-21 MED ORDER — PHENYLEPHRINE 40 MCG/ML (10ML) SYRINGE FOR IV PUSH (FOR BLOOD PRESSURE SUPPORT): 80.0000 ug | PREFILLED_SYRINGE | INTRAVENOUS | Status: DC | PRN

## 2015-04-21 MED ORDER — EPHEDRINE 5 MG/ML INJ: 10.0000 mg | INTRAVENOUS | Status: DC | PRN

## 2015-04-21 MED ORDER — MORPHINE SULFATE 0.5 MG/ML IJ SOLN
INTRAMUSCULAR | Status: AC
  Filled 2015-04-21: qty 10

## 2015-04-21 MED ORDER — LABETALOL HCL 5 MG/ML IV SOLN
INTRAVENOUS | Status: AC
  Filled 2015-04-21: qty 4

## 2015-04-21 SURGICAL SUPPLY — 31 items
BENZOIN TINCTURE PRP APPL 2/3 (GAUZE/BANDAGES/DRESSINGS) ×2 IMPLANT
CLAMP CORD UMBIL (MISCELLANEOUS) IMPLANT
CLOTH BEACON ORANGE TIMEOUT ST (SAFETY) ×2 IMPLANT
CONTAINER PREFILL 10% NBF 15ML (MISCELLANEOUS) IMPLANT
DRAPE SHEET LG 3/4 BI-LAMINATE (DRAPES) IMPLANT
DRSG OPSITE POSTOP 4X10 (GAUZE/BANDAGES/DRESSINGS) ×2 IMPLANT
DRSG TELFA 3X8 NADH (GAUZE/BANDAGES/DRESSINGS) ×2 IMPLANT
DURAPREP 26ML APPLICATOR (WOUND CARE) ×2 IMPLANT
ELECT REM PT RETURN 9FT ADLT (ELECTROSURGICAL) ×2
ELECTRODE REM PT RTRN 9FT ADLT (ELECTROSURGICAL) ×1 IMPLANT
EXTRACTOR VACUUM M CUP 4 TUBE (SUCTIONS) IMPLANT
GLOVE BIO SURGEON STRL SZ7.5 (GLOVE) ×6 IMPLANT
GLOVE BIOGEL PI IND STRL 7.5 (GLOVE) ×3 IMPLANT
GLOVE BIOGEL PI INDICATOR 7.5 (GLOVE) ×3
GOWN STRL REUS W/TWL LRG LVL3 (GOWN DISPOSABLE) ×4 IMPLANT
KIT ABG SYR 3ML LUER SLIP (SYRINGE) IMPLANT
NEEDLE HYPO 25X5/8 SAFETYGLIDE (NEEDLE) IMPLANT
NS IRRIG 1000ML POUR BTL (IV SOLUTION) ×2 IMPLANT
PACK C SECTION WH (CUSTOM PROCEDURE TRAY) ×2 IMPLANT
PAD OB MATERNITY 4.3X12.25 (PERSONAL CARE ITEMS) ×2 IMPLANT
RTRCTR C-SECT PINK 25CM LRG (MISCELLANEOUS) ×2 IMPLANT
STRIP CLOSURE SKIN 1/2X4 (GAUZE/BANDAGES/DRESSINGS) ×2 IMPLANT
SUT CHROMIC 2 0 CT 1 (SUTURE) ×2 IMPLANT
SUT MNCRL AB 3-0 PS2 27 (SUTURE) ×2 IMPLANT
SUT PLAIN 0 NONE (SUTURE) ×2 IMPLANT
SUT PLAIN 2 0 XLH (SUTURE) ×2 IMPLANT
SUT VIC AB 0 CT1 36 (SUTURE) ×2 IMPLANT
SUT VIC AB 0 CTX 36 (SUTURE) ×3
SUT VIC AB 0 CTX36XBRD ANBCTRL (SUTURE) ×3 IMPLANT
TOWEL OR 17X24 6PK STRL BLUE (TOWEL DISPOSABLE) ×2 IMPLANT
TRAY FOLEY CATH SILVER 14FR (SET/KITS/TRAYS/PACK) ×2 IMPLANT

## 2015-04-21 NOTE — Anesthesia Preprocedure Evaluation (Signed)
Anesthesia Evaluation  Patient identified by MRN, date of birth, ID band Patient awake    Reviewed: Allergy & Precautions, H&P , NPO status , Patient's Chart, lab work & pertinent test results  History of Anesthesia Complications Negative for: history of anesthetic complications  Airway Mallampati: II  TM Distance: >3 FB Neck ROM: full    Dental no notable dental hx. (+) Teeth Intact   Pulmonary neg pulmonary ROS, asthma ,  breath sounds clear to auscultation  Pulmonary exam normal       Cardiovascular negative cardio ROS Normal cardiovascular exam+ dysrhythmias Rhythm:regular Rate:Normal     Neuro/Psych negative neurological ROS  negative psych ROS   GI/Hepatic negative GI ROS, Neg liver ROS,   Endo/Other  negative endocrine ROS  Renal/GU negative Renal ROS  negative genitourinary   Musculoskeletal negative musculoskeletal ROS (+)   Abdominal   Peds negative pediatric ROS (+)  Hematology negative hematology ROS (+)   Anesthesia Other Findings   Reproductive/Obstetrics negative OB ROS (+) Pregnancy                             Anesthesia Physical Anesthesia Plan  ASA: II  Anesthesia Plan: Epidural   Post-op Pain Management:    Induction:   Airway Management Planned:   Additional Equipment:   Intra-op Plan:   Post-operative Plan:   Informed Consent: I have reviewed the patients History and Physical, chart, labs and discussed the procedure including the risks, benefits and alternatives for the proposed anesthesia with the patient or authorized representative who has indicated his/her understanding and acceptance.     Plan Discussed with:   Anesthesia Plan Comments:         Anesthesia Quick Evaluation

## 2015-04-21 NOTE — Progress Notes (Addendum)
  Subjective: Assuming care of this 39 yo G1P0 @ 40.4 wks, pt of Dr. Landry Mellow, admitted in latent labor - was a scheduled induction for tonight. Denies bleeding. +FM. FOB and pt's parents at bedside. Anesthesia in room to re-dose epidural.  Denies h/a, visual disturbances, RUQ pain, N/V, weakness, CP or SOB.  Objective: BP 153/74 mmHg  Pulse 89  Temp(Src) 97.9 F (36.6 C) (Oral)  Resp 20  Ht 5\' 8"  (1.727 m)  Wt 93.895 kg (207 lb)  BMI 31.48 kg/m2  SpO2 100%  LMP 07/17/2014      Today's Vitals   04/21/15 1933 04/21/15 1935 04/21/15 1937 04/21/15 1938  BP:  161/83 142/56   Pulse: 89 83 87 87  Temp:      TempSrc:      Resp:  20 18   Height:      Weight:      SpO2: 100%   100%  PainSc:       BP range 110-161/60s-80s FHT: BL 140 w/ moderate variability, occ accel, earlys, non-repetitive variables and occ late UC:   irregular, every 3-4 minutes SVE:   Dilation: 5 Effacement (%): 80 Station: -2 Exam by:: Donnel Saxon CNM Pitocin off since 18:31 PM - max 3 mU/min  Assessment:  IUP at 40.4 wks Full term pregnancy  GBS positive Cat 2 FHRT - improving w/ intrauterine resuscitative measures Elevated BPs, likely pain related AMA Genital HSV Hypothyroidism H/O tachycardia  Plan: Amnioinfusion Monitor tracing and BPs closely - consider preeclampsia labs Restart Pit when pt comfortable and Cat 1 FHRT Consult prn  Farrel Gordon CNM 04/21/2015, 7:36 PM

## 2015-04-21 NOTE — Anesthesia Postprocedure Evaluation (Signed)
  Anesthesia Post-op Note  Patient: Janice Flores  Procedure(s) Performed: Procedure(s) (LRB): CESAREAN SECTION (N/A)  Patient Location: PACU  Anesthesia Type: Epidural  Level of Consciousness: awake and alert   Airway and Oxygen Therapy: Patient Spontanous Breathing  Post-op Pain: mild  Post-op Assessment: Post-op Vital signs reviewed, Patient's Cardiovascular Status Stable, Respiratory Function Stable, Patent Airway and No signs of Nausea or vomiting  Last Vitals:  Filed Vitals:   04/21/15 2330  BP:   Pulse: 81  Temp:   Resp: 16    Post-op Vital Signs: stable   Complications: No apparent anesthesia complications

## 2015-04-21 NOTE — MAU Note (Signed)
Pt presents to MAU with complaints of contractions that started yesterday but have gotten more regular this morning. Denies any vaginal bleeding of LOF

## 2015-04-21 NOTE — Progress Notes (Signed)
Prolonged decel with long contraction--resolved after position change, IV bolus, O2, pit off. Good scalp stim response. Pitocin on 1 mu/min at time of decel. MVUs 60-100 at that time.  Cervix unchanged--5, 80%, vtx, -2, cervix posterior.  Will continue to observe at present. Re-evaluate for restart of pitocin after rest period.Donnel Saxon, CNM 04/21/15 7p

## 2015-04-21 NOTE — Anesthesia Procedure Notes (Signed)
Epidural Patient location during procedure: OB  Staffing Anesthesiologist: Montez Hageman Performed by: anesthesiologist   Preanesthetic Checklist Completed: patient identified, site marked, surgical consent, pre-op evaluation, timeout performed, IV checked, risks and benefits discussed and monitors and equipment checked  Epidural Patient position: sitting Prep: DuraPrep Patient monitoring: heart rate, continuous pulse ox and blood pressure Approach: midline Location: L3-L4 Injection technique: LOR saline  Needle:  Needle type: Tuohy  Needle gauge: 17 G Needle length: 9 cm and 9 Needle insertion depth: 6 cm Catheter type: closed end flexible Catheter size: 20 Guage Catheter at skin depth: 11 cm Test dose: negative  Assessment Events: blood not aspirated, injection not painful, no injection resistance, negative IV test and no paresthesia  Additional Notes   Patient tolerated the insertion well without complications.

## 2015-04-21 NOTE — Progress Notes (Addendum)
Subjective: Comfortable with epidural.  Objective: BP 119/51 mmHg  Pulse 79  Temp(Src) 98.2 F (36.8 C) (Oral)  Resp 18  Ht 5\' 8"  (1.727 m)  Wt 93.895 kg (207 lb)  BMI 31.48 kg/m2  SpO2 100%  LMP 07/17/2014      FHT: Category 1 UC:   irregular, every 4-7 minutes SVE:   Dilation: 5.5 Effacement (%): 80 Station: -2 Exam by:: Donnel Saxon CNM  Prolonged decel associated with coupling UC, recovered with position change, moderate variability FSE applied. MVUs 60-64  Assessment:  Advanced cervical dilation Early labor pattern of UCs GBS positive  Plan: Will start pitocin augmentation. Close observation of FHR status.  Donnel Saxon CNM, [place] 04/21/2015, 5:55 PM

## 2015-04-21 NOTE — Progress Notes (Addendum)
Called to room to evaluate tracing. Prolonged late decel lasting greater than 2 minutes despite intrauterine resuscitative measures noted with slow recovery. Cvx essentially unchanged. Dr. Mancel Bale contacted/ tracing reviewed. Recommended c-section to pt with R/B/A associated with primary c-section reviewed, to include but not limited to bleeding, infection and damage to surrounding organs. Pt verbalized understanding and wishes to proceed. Terbutaline sub q x 1 now.   Dr. Mancel Bale en-route.   Farrel Gordon, CNM 04/21/15, 8:24 PM  I reviewed recs with the patient secondary to recurrent late decels and discussed risks benefits alternatives of a c-section including but not limited to bleeding infection and injury, questions answered and consent signed and witnessed.

## 2015-04-21 NOTE — Op Note (Addendum)
Cesarean Section Procedure Note  Indications: 40 4/7 wks with fetal intolerance of labor  Pre-operative Diagnosis: 1.40 4/7wks 2.Fetal intolorence to labor   Post-operative Diagnosis: 1.40 4/7wks 2.Fetal intolorence to labor  Procedure: CESAREAN SECTION  Surgeon: Everett Graff, MD    Assistants: Farrel Gordon, CNM  Anesthesia: Regional  Anesthesiologist: Montez Hageman, MD   Procedure Details  The patient was taken to the operating room secondary to fetal intolerance of labor after the risks, benefits, complications, treatment options, and expected outcomes were discussed with the patient.  The patient concurred with the proposed plan, giving informed consent which was signed and witnessed. The patient was taken to Operating Room C-Section Suite, identified as Janice Flores and the procedure verified as C-Section Delivery. A Time Out was held and the above information confirmed.  After induction of anesthesia by obtaining a spinal, the patient was prepped and draped in the usual sterile manner. A Pfannenstiel skin incision was made and carried down through the subcutaneous tissue to the underlying layer of fascia.  The fascia was incised bilaterally and extended transversely bilaterally with the Mayo scissors. Kocher clamps were placed on the inferior aspect of the fascial incision and the underlying rectus muscle was separated from the fascia. The same was done on the superior aspect of the fascial incision.  The peritoneum was identified, entered bluntly and extended manually.  An Alexis self-retaining retractor was placed.  The utero-vesical peritoneal reflection was incised transversely and the bladder flap was bluntly freed from the lower uterine segment. A low transverse uterine incision was made with the scalpel and extended bilaterally with the bandage scissors.  The infant was delivered in vertex presentation without difficulty in left occiput posterior position.  After the  umbilical cord was clamped and cut, the infant was handed to the awaiting pediatricians.  Cord blood was obtained for evaluation.  The placenta was removed intact and appeared to be within normal limits. The uterus was cleared of all clots and debris. The uterine incision was closed with running interlocking sutures of 0 Vicryl and a second imbricating layer was performed as well.  Bilateral tubes and ovaries appeared to be within normal limits.  Good hemostasis was noted.  Copious irrigation was performed until clear.  The peritoneum was repaired with 2-0 chromic via a running suture.  The fascia was reapproximated with a running suture of 0 Vicryl. The subcutaneous tissue was reapproximated with 3 interrupted sutures of 2-0 plain.  The skin was reapproximated with a subcuticular suture of 3-0 monocryl.  Steristrips were applied.  Instrument, sponge, and needle counts were correct prior to abdominal closure and at the conclusion of the case.  The patient was awaiting transfer to the recovery room in good condition.  Findings: Live female infant with Apgars 8 at one minute and 9 at five minutes.  Normal appearing bilateral ovaries and fallopian tubes were noted.  Elevated intraop BPs, will check GHTN labs in recovery room.  Estimated Blood Loss:  534ml         Drains: foley to gravity 200cc clear urine         Total IV Fluids: 1983ml         Specimens to Pathology: Placenta         Complications:  None; patient tolerated the procedure well.         Disposition: PACU - hemodynamically stable.         Condition: stable  Attending Attestation: I performed the procedure.

## 2015-04-21 NOTE — Transfer of Care (Signed)
Immediate Anesthesia Transfer of Care Note  Patient: Janice Flores  Procedure(s) Performed: Procedure(s): CESAREAN SECTION (N/A)  Patient Location: PACU  Anesthesia Type:Epidural  Level of Consciousness: awake, alert  and oriented  Airway & Oxygen Therapy: Patient Spontanous Breathing  Post-op Assessment: Report given to RN and Post -op Vital signs reviewed and stable  Post vital signs: Reviewed and stable  Last Vitals:  Filed Vitals:   04/21/15 2221  BP: 142/64  Pulse: 91  Temp: 36.9 C  Resp: 20    Complications: No apparent anesthesia complications

## 2015-04-21 NOTE — H&P (Signed)
Janice Flores is a 39 y.o. female, G1P0 at 89 4/7 weeks, patient of Dr. Landry Mellow, presenting for admission in latent labor, scheduled for induction tonight for post-dates.  Has been contracting since last night, stronger today.  Denies leaking or bleeding, reports +FM.  Denies HSV lesions or prodrome.   Patient Active Problem List   Diagnosis Date Noted  . 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 Tachycardia 04/21/2015  . Advanced maternal age, 1st pregnancy 04/21/2015  . Positive GBS test 04/21/2015  . Normal labor 04/21/2015  . SOB (shortness of breath) 02/22/2015  . Heart palpitations 02/22/2015  . PVC's (premature ventricular contractions) 09/28/2013  . Hypothyroidism   . Iron deficiency   . Anemia   Hx episodes of tachycardia in past, with past use of Toprol in past.  Se[facility] by [facility] Fransico Him at [facility] 2014 and 02/21/2015    History of present pregnancy: Patient entered care at 11 weeks.   EDC of 04/17/15 was established by [place] at 9 weeks.   Anatomy scan:  19 weeks, with normal findings.   Additional [place] evaluations:  None.   Significant prenatal events:  On Valtrex for suppressive tx for hx HSV.  Seen by cardiologist 02/2015 for episodes of tachycardia--Holter monitor showed NSR and sinus tachy up to 108 bpm with benign PVCs. Last evaluation:  Last week  OB History    Gravida Para Term Preterm AB TAB SAB Ectopic Multiple Living   1              Past Medical History  Diagnosis Date  . Hypothyroidism   . Iron deficiency   . Vitamin D deficiency   . Anemia   . Tachycardia     intermittent tachycardia   Past Surgical History  Procedure Laterality Date  . Removal of polyp from uterus     Family History: family history includes Asthma in her brother; Hypercholesterolemia in her mother; Hypertension in her father; Sarcoidosis in her mother; Sickle cell trait in her brother.   Social History:  reports that she has never smoked. She has never used smokeless tobacco. She reports that she  drinks alcohol. She reports that she does not use illicit drugs.  Patient is African American, FOB Barbaraann Rondo) is involved and supportive.     Prenatal Transfer Tool  Maternal Diabetes: No Genetic Screening: Normal Panorama and AFP Maternal Ultrasounds/Referrals: Normal Fetal Ultrasounds or other Referrals:  None Maternal Substance Abuse:  No Significant Maternal Medications:  Meds include: Syntroid Significant Maternal Lab Results: Lab values include: Group B Strep positive  TDAP NA Flu NA  ROS:  Contractions, +FM  No Known Allergies   Dilation: 1 Effacement (%): 100 Station: -3 Exam by:: A. Gagliardo, RN Blood pressure 153/70, pulse 76, temperature 98.1 F (36.7 C), resp. rate 18, height 5\' 8"  (1.727 m), weight 93.895 kg (207 lb), last menstrual period 07/17/2014.  Chest clear Heart RRR without murmur Abd gravid, NT, FH 39 Pelvic: Cervix 1 cm, 100%, vtx, -2, copious d/c with exam with apparent vernix mixed in.  No HSV lesions or prodrome. Ext:  WNL  FHR: Category 1 UCs:  q 3-4 min  Prenatal labs: ABO, Rh: B/Positive/-- (10/01 0000) Antibody: Negative (10/01 0000) Rubella:   Immune RPR: Nonreactive (10/01 0000)  HBsAg: Negative (10/01 0000)  HIV: Non-reactive (10/01 0000)  GBS: Positive (04/12 0000) Sickle cell/Hgb electrophoresis:  WNL Pap:  NA GC:  Negative 08/08/14 Chlamydia:  Negative 08/08/14 Genetic screenings:  Normal Panorama and AFP Glucola:  WNL  Other:      Assessment/Plan: IUP at 40 4/7 weeks Latent labor--scheduled for induction tonight GBS positive Hx HSV--no current lesions or prodrome ? SROM with exam. Desires epidural.   Plan: Admit to [facility] per consult with Dr. Charlesetta Garibaldi Routine [facility] orders Pain med/epidural prn PCN G for GBS prophylaxis  Pitocin if no change in cervix.   [place], [place], [place] 04/21/2015, 2:05 PM

## 2015-04-22 ENCOUNTER — Encounter (HOSPITAL_COMMUNITY): Payer: Self-pay

## 2015-04-22 LAB — CBC
HCT: 31 % — ABNORMAL LOW (ref 36.0–46.0)
Hemoglobin: 10.8 g/dL — ABNORMAL LOW (ref 12.0–15.0)
MCH: 31.9 pg (ref 26.0–34.0)
MCHC: 34.8 g/dL (ref 30.0–36.0)
MCV: 91.4 fL (ref 78.0–100.0)
Platelets: 273 10*3/uL (ref 150–400)
RBC: 3.39 MIL/uL — ABNORMAL LOW (ref 3.87–5.11)
RDW: 16 % — ABNORMAL HIGH (ref 11.5–15.5)
WBC: 10.4 10*3/uL (ref 4.0–10.5)

## 2015-04-22 LAB — ABO/RH: ABO/RH(D): B POS

## 2015-04-22 LAB — RPR: RPR Ser Ql: NONREACTIVE

## 2015-04-22 LAB — HIV ANTIBODY (ROUTINE TESTING W REFLEX): HIV Screen 4th Generation wRfx: NONREACTIVE

## 2015-04-22 LAB — PROTEIN / CREATININE RATIO, URINE
Creatinine, Urine: 23 mg/dL
Total Protein, Urine: <6 mg/dL

## 2015-04-22 MED ORDER — OXYCODONE-ACETAMINOPHEN 5-325 MG PO TABS
1.0000 | ORAL_TABLET | ORAL | Status: DC | PRN
Start: 2015-04-22 — End: 2015-04-24
  Administered 2015-04-23 – 2015-04-24 (×2): 1 via ORAL
  Filled 2015-04-22 (×2): qty 1

## 2015-04-22 MED ORDER — LANOLIN HYDROUS EX OINT: 1.0000 "application " | TOPICAL_OINTMENT | CUTANEOUS | Status: DC | PRN

## 2015-04-22 MED ORDER — SODIUM CHLORIDE 0.9 % IJ SOLN: 3.0000 mL | INTRAMUSCULAR | Status: DC | PRN

## 2015-04-22 MED ORDER — SCOPOLAMINE 1 MG/3DAYS TD PT72
1.0000 | MEDICATED_PATCH | Freq: Once | TRANSDERMAL | Status: DC
  Filled 2015-04-22: qty 1

## 2015-04-22 MED ORDER — DIPHENHYDRAMINE HCL 25 MG PO CAPS
25.0000 mg | ORAL_CAPSULE | ORAL | Status: DC | PRN
  Administered 2015-04-22 (×2): 25 mg via ORAL

## 2015-04-22 MED ORDER — OXYCODONE-ACETAMINOPHEN 5-325 MG PO TABS
2.0000 | ORAL_TABLET | ORAL | Status: DC | PRN
Start: 2015-04-22 — End: 2015-04-24

## 2015-04-22 MED ORDER — NALBUPHINE HCL 10 MG/ML IJ SOLN: 5.0000 mg | Freq: Once | INTRAMUSCULAR | Status: AC | PRN

## 2015-04-22 MED ORDER — ACETAMINOPHEN 325 MG PO TABS
650.0000 mg | ORAL_TABLET | ORAL | Status: DC | PRN
  Administered 2015-04-23: 650 mg via ORAL
  Filled 2015-04-22: qty 2

## 2015-04-22 MED ORDER — DIBUCAINE 1 % RE OINT: 1.0000 "application " | TOPICAL_OINTMENT | RECTAL | Status: DC | PRN

## 2015-04-22 MED ORDER — SIMETHICONE 80 MG PO CHEW
80.0000 mg | CHEWABLE_TABLET | Freq: Three times a day (TID) | ORAL | Status: DC
  Administered 2015-04-22 – 2015-04-24 (×5): 80 mg via ORAL
  Filled 2015-04-22 (×6): qty 1

## 2015-04-22 MED ORDER — LACTATED RINGERS IV SOLN
INTRAVENOUS | Status: DC
  Administered 2015-04-22: 07:00:00 via INTRAVENOUS

## 2015-04-22 MED ORDER — DEXTROSE 5 % IV SOLN
1.0000 ug/kg/h | INTRAVENOUS | Status: DC | PRN
  Filled 2015-04-22: qty 2

## 2015-04-22 MED ORDER — LEVOTHYROXINE SODIUM 75 MCG PO TABS
75.0000 ug | ORAL_TABLET | Freq: Every day | ORAL | Status: DC
  Administered 2015-04-22 – 2015-04-23 (×2): 75 ug via ORAL
  Filled 2015-04-22 (×4): qty 1

## 2015-04-22 MED ORDER — NALBUPHINE HCL 10 MG/ML IJ SOLN: 5.0000 mg | INTRAMUSCULAR | Status: DC | PRN

## 2015-04-22 MED ORDER — NALOXONE HCL 0.4 MG/ML IJ SOLN: 0.4000 mg | INTRAMUSCULAR | Status: DC | PRN

## 2015-04-22 MED ORDER — WITCH HAZEL-GLYCERIN EX PADS: 1.0000 "application " | MEDICATED_PAD | CUTANEOUS | Status: DC | PRN

## 2015-04-22 MED ORDER — ALBUTEROL SULFATE (2.5 MG/3ML) 0.083% IN NEBU: 2.5000 mg | INHALATION_SOLUTION | Freq: Four times a day (QID) | RESPIRATORY_TRACT | Status: DC | PRN

## 2015-04-22 MED ORDER — ONDANSETRON HCL 4 MG/2ML IJ SOLN: 4.0000 mg | Freq: Three times a day (TID) | INTRAMUSCULAR | Status: DC | PRN

## 2015-04-22 MED ORDER — SIMETHICONE 80 MG PO CHEW: 80.0000 mg | CHEWABLE_TABLET | ORAL | Status: DC | PRN

## 2015-04-22 MED ORDER — MENTHOL 3 MG MT LOZG: 1.0000 | LOZENGE | OROMUCOSAL | Status: DC | PRN

## 2015-04-22 MED ORDER — DIPHENHYDRAMINE HCL 50 MG/ML IJ SOLN: 12.5000 mg | INTRAMUSCULAR | Status: DC | PRN

## 2015-04-22 MED ORDER — OXYTOCIN 40 UNITS IN LACTATED RINGERS INFUSION - SIMPLE MED: 62.5000 mL/h | INTRAVENOUS | Status: AC

## 2015-04-22 MED ORDER — DIPHENHYDRAMINE HCL 25 MG PO CAPS
25.0000 mg | ORAL_CAPSULE | Freq: Four times a day (QID) | ORAL | Status: DC | PRN
  Filled 2015-04-22 (×2): qty 1

## 2015-04-22 MED ORDER — TETANUS-DIPHTH-ACELL PERTUSSIS 5-2.5-18.5 LF-MCG/0.5 IM SUSP: 0.5000 mL | Freq: Once | INTRAMUSCULAR | Status: DC

## 2015-04-22 MED ORDER — IBUPROFEN 600 MG PO TABS
600.0000 mg | ORAL_TABLET | Freq: Four times a day (QID) | ORAL | Status: DC
  Administered 2015-04-22 – 2015-04-24 (×10): 600 mg via ORAL
  Filled 2015-04-22 (×10): qty 1

## 2015-04-22 MED ORDER — ZOLPIDEM TARTRATE 5 MG PO TABS: 5.0000 mg | ORAL_TABLET | Freq: Every evening | ORAL | Status: DC | PRN

## 2015-04-22 MED ORDER — SENNOSIDES-DOCUSATE SODIUM 8.6-50 MG PO TABS
2.0000 | ORAL_TABLET | ORAL | Status: DC
  Administered 2015-04-22 – 2015-04-23 (×2): 2 via ORAL
  Filled 2015-04-22 (×2): qty 2

## 2015-04-22 MED ORDER — SIMETHICONE 80 MG PO CHEW
80.0000 mg | CHEWABLE_TABLET | ORAL | Status: DC
  Administered 2015-04-22 – 2015-04-23 (×3): 80 mg via ORAL
  Filled 2015-04-22 (×3): qty 1

## 2015-04-22 MED ORDER — PRENATAL MULTIVITAMIN CH
1.0000 | ORAL_TABLET | Freq: Every day | ORAL | Status: DC
  Administered 2015-04-22 – 2015-04-24 (×3): 1 via ORAL
  Filled 2015-04-22 (×3): qty 1

## 2015-04-22 NOTE — Anesthesia Postprocedure Evaluation (Signed)
  Anesthesia Post-op Note  Patient: Janice Flores  Procedure(s) Performed: Procedure(s): CESAREAN SECTION (N/A)  Patient Location: Mother/Baby  Anesthesia Type:Epidural  Level of Consciousness: awake, alert , oriented and patient cooperative  Airway and Oxygen Therapy: Patient Spontanous Breathing  Post-op Pain: none  Post-op Assessment: Post-op Vital signs reviewed, Patient's Cardiovascular Status Stable, Respiratory Function Stable, Patent Airway, No headache, No backache, No residual numbness and No residual motor weakness  Post-op Vital Signs: Reviewed and stable  Last Vitals:  Filed Vitals:   04/22/15 0300  BP: 124/61  Pulse: 68  Temp: 37 C  Resp: 19    Complications: No apparent anesthesia complications

## 2015-04-22 NOTE — Lactation Note (Signed)
This note was copied from the chart of [place]. Lactation Consultation Note New mom, baby sleepy at 81 hrs old. Mom had c-section, generalized edema. Breast heavy, hasn't been leaking. Hand expression taught w/no colostrum expressed. Explained about edema and fluid retention in breast. Reverse pressure to areolas. Has everted nipple. Denies any leaking, has had changes in breast. Mom has personal Medela DEBP, asked if someone could show her how to work it later in the afternoon. She didn't want to do it now d/t being tired.  Mom encouraged to feed baby 8-12 times/24 hours and with feeding cues. Mom encouraged to waken baby for feeds.  Educated about newborn behavior. Mom encouraged to do skin-to-skin.Referred to Baby and Me Book in Breastfeeding section Pg. 22-23 for position options and Proper latch demonstration. [place] brochure given w/resources, support groups and [place] services. Patient Name: Janice Flores [id number] Date: 04/22/2015 Reason for consult: Initial assessment   Maternal Data Has patient been taught Hand Expression?: Yes Does the patient have breastfeeding experience prior to this delivery?: No  Feeding    LATCH Score/Interventions       Type of Nipple: Everted at rest and after stimulation  Comfort (Breast/Nipple): Soft / non-tender     Intervention(s): Breastfeeding basics reviewed;Support Pillows;Position options;Skin to skin     Lactation Tools Discussed/Used     Consult Status Consult Status: Follow-up Date: 04/22/15 (in pm) Follow-up type: In-patient    Norissa Bartee, [place] 04/22/2015, 4:11 AM

## 2015-04-22 NOTE — Addendum Note (Signed)
Addendum  created 04/22/15 0729 by Raenette Rover, CRNA   Modules edited: Notes Section   Notes Section:  File: [id number]

## 2015-04-23 ENCOUNTER — Encounter (HOSPITAL_COMMUNITY): Payer: Self-pay | Admitting: Obstetrics and Gynecology

## 2015-04-23 NOTE — Progress Notes (Signed)
Patient had Honeycomb dressing OVER pressure dressing.  After shower, replaced with honeycomb dressing, 30 hours post-section.  Incision clean, dry, intact; no drainage.

## 2015-04-23 NOTE — Progress Notes (Signed)
Subjective: Postpartum Day 2: Cesarean Delivery Patient reports tolerating PO and no problems voiding.    Objective: Vital signs in last 24 hours: Temp:  [98.3 F (36.8 C)-98.8 F (37.1 C)] 98.8 F (37.1 C) (05/17 0536) Pulse Rate:  [75-78] 76 (05/17 0536) Resp:  [18] 18 (05/17 0536) BP: (118-127)/(52-58) 126/54 mmHg (05/17 0536) SpO2:  [99 %] 99 % (05/17 0536)  Physical Exam:  General: alert and cooperative Lochia: appropriate Uterine Fundus: firm Incision: healing well DVT Evaluation: No evidence of DVT seen on physical exam.   Recent Labs  04/21/15 2305 04/22/15 0550  HGB 10.5* 10.8*  HCT 30.3* 31.0*    Assessment/Plan: Status post Cesarean section. Doing well postoperatively.  Continue current care.  Lenny Fiumara J. 04/23/2015, 4:43 PM

## 2015-04-24 MED ORDER — OXYCODONE-ACETAMINOPHEN 5-325 MG PO TABS: 1.0000 | ORAL_TABLET | ORAL | Status: DC | PRN

## 2015-04-24 MED ORDER — IBUPROFEN 600 MG PO TABS: 600.0000 mg | ORAL_TABLET | Freq: Four times a day (QID) | ORAL | Status: DC | PRN

## 2015-04-24 NOTE — Lactation Note (Addendum)
This note was copied from the chart of [place]. Lactation Consultation Note  Assisted mother to side lying posiiton to L side.  Sucks and some swallows observed. Baby breastfed for 15 min and fell asleep.  Attempted latching on R breast but baby satisfied. Mother's breasts engorged.  Education provided and ice packs. Encouraged mother to massage breast during feeding to empty breast. Assisted mother with hand pumping after feeding to soften breast.   Reviewed S&S of mastitis and instruction to call OB/Gyn. Discussed monitoring voids/stools.  Mom encouraged to feed baby 8-12 times/24 hours and with feeding cues.    Patient Name: Janice Flores [id number] Date: 04/24/2015 Reason for consult: Follow-up assessment   Maternal Data    Feeding Feeding Type: Breast Fed Length of feed: 15 min  LATCH Score/Interventions Latch: Grasps breast easily, tongue down, lips flanged, rhythmical sucking. Intervention(s): Assist with latch;Breast massage  Audible Swallowing: A few with stimulation Intervention(s): Hand expression  Type of Nipple: Everted at rest and after stimulation  Comfort (Breast/Nipple): Soft / non-tender     Hold (Positioning): Assistance needed to correctly position infant at breast and maintain latch.  LATCH Score: 8  Lactation Tools Discussed/Used     Consult Status Consult Status: Complete    Carlye Grippe 04/24/2015, 10:20 AM

## 2015-04-24 NOTE — Discharge Summary (Signed)
Obstetric Discharge Summary Reason for Admission: onset of labor Prenatal Procedures: none Intrapartum Procedures: cesarean: low cervical, transverse Postpartum Procedures: none Complications-Operative and Postpartum: none HEMOGLOBIN  Date Value Ref Range Status  04/22/2015 10.8* 12.0 - 15.0 g/dL Final   HCT  Date Value Ref Range Status  04/22/2015 31.0* 36.0 - 46.0 % Final    Physical Exam:  General: alert and cooperative Lochia: appropriate Uterine Fundus: firm Incision: healing well DVT Evaluation: No evidence of DVT seen on physical exam.  Discharge Diagnoses: Term Pregnancy-delivered  Discharge Information: Date: 04/24/2015 Activity: pelvic rest Diet: routine Medications: PNV, Ibuprofen and Percocet Condition: stable Instructions: refer to practice specific booklet Discharge to: home Follow-up Information    Follow up with Janice Brow., MD. Schedule an appointment as soon as possible for a visit in 2 weeks.   Specialty:  Obstetrics and Gynecology   Why:  incision check    Contact information:   [address] [place] [address] [phone]       Newborn Data: Live born female  Birth Weight: 7 lb 7.9 oz (3400 g) APGAR: 8, 9  Home with mother.  Janice Jr J. 04/24/2015, 8:30 AM

## 2015-05-21 ENCOUNTER — Ambulatory Visit (HOSPITAL_COMMUNITY)
Admission: RE | Admit: 2015-05-21 | Discharge: 2015-05-21 | Disposition: A | Discharge status: Home / Self Care | Payer: Commercial Managed Care - PPO | Source: Ambulatory Visit | Attending: Obstetrics and Gynecology | Admitting: Obstetrics and Gynecology

## 2015-05-21 NOTE — Lactation Note (Signed)
Lactation Consult  Mother's reason for visit:  Pain on left nipple Visit Type:  OP Appointment Notes:  Janice Flores reports soreness when feeding Janice Flores on the left breast.  Feedings are taking 30-45 minutes.  Typically he is fed in a cradle or cross cradle hold.  We positioned him in a football hold and and after a few attempts he latched deeply and suckled rhythmically.  Mom reported no pain with this latch and the breast softened. Transfer was 42 ml in less that 15 minutes. He was positioned on the left breast and latched but was too sleepy to eat.  Mom reported that he had a "snack" one hour before this feeding.  Plan  Use positioning tips and attend support group. Consult:  Initial Lactation Consultant:  Janice Flores  ________________________________________________________________________  Janice Flores Name: Janice Flores Date of Birth: 04/21/2017 Pediatrician: Janice Flores Gender: female Gestational Age: [redacted]w[redacted]d (At Birth) Birth Weight: 7 lb 7.9 oz (3400 g) Weight at Discharge: Weight: 6 lb 14.6 oz (3135 g)Date of Discharge: 04/24/2015 [facility] Weights   04/21/15 2132 04/22/15 2335 04/24/15 0020  Weight: 7 lb 7.9 oz (3400 g) 7 lb 3.2 oz (3265 g) 6 lb 14.6 oz (3135 g)   Last weight taken from location outside of [facility]: 8# 4 oz 2 weeks ago Location:Pediatrician's office Weight today: 9 # 3.8 oz     ________________________________________________________________________  Mother's Name: Janice Flores Type of delivery:  Cesarean Breastfeeding Experience:  P1 Maternal Medical Conditions:  Hypothyroid Maternal Medications:  PNV, levothyroxin  ________________________________________________________________________  Breastfeeding History (Post Discharge)  Frequency of breastfeeding:  9 - 10 times in 24 hours Duration of feeding:  30 - 60 Usually 30-45 min    Infant Intake and Output Assessment  Voids:  6+ in 24 hrs.  Color:  Clear  yellow Stools:  6+ in 24 hrs.  Color:  Brown and Yellow  ________________________________________________________________________  Maternal Breast Assessment  Breast:  Full Nipple:  Erect Pain level:  0 Pain interventions:  NA  _______________________________________________________________________

## 2015-05-28 ENCOUNTER — Ambulatory Visit: Payer: Commercial Managed Care - PPO | Admitting: Cardiology

## 2015-07-26 ENCOUNTER — Ambulatory Visit (HOSPITAL_COMMUNITY)
Admission: RE | Admit: 2015-07-26 | Discharge: 2015-07-26 | Disposition: A | Discharge status: Home / Self Care | Payer: Commercial Managed Care - PPO | Source: Ambulatory Visit | Attending: Obstetrics and Gynecology | Admitting: Obstetrics and Gynecology

## 2015-07-26 NOTE — Lactation Note (Signed)
Lactation Consult  Mother's reason for visit:  Concerned about milk supply- feels like it has dropped over the last week Visit Type:  Feeding assessment  Consult:  Follow-Up Lactation Consultant:  Linus Mako D  ________________________________________________________________________ Mom has had some dizzy spells- saw a RN at work who asking her about eating and drinking enough fluids. Mom states she is not eating enough and definitely was not drinking enough. Reviewed importance of adequate intake to milk supply. Mom not sure if pumping is working well- suction checked and pump has good suction. States sometimes nipples are burning after pumping- like it is too tight. #27 flange given and mom tried it and reports that feels much better. Asking about how often to pump- encouraged to pump on baby's schedule if possible- may need to get an extra pumping in at work. Praise given for her efforts. No further questions at present. To try these suggestions and see if supply increases. To call prn   ________________________________________________________________________  Mother's Name: Janice Flores Type of delivery:    Voids:  QS in 24 hrs.  Color:  Clear yellow Stools:  QS in 24 hrs.  Color:  Yellow  ________________________________________________________________________  _______________________________________________________________________   Pre-feed weight:  6164 g  (13 lb. 9.4 oz.) Post-feed weight:  6285 g (13. lb. 13.5oz.) Amount transferred:  121 ml Amount supplemented:  0 ml  Pre feed weight:  6285 g  (13 lb. 13.5oz.) Post-feed weight:  6354 g (14 lb. 0.1 oz.) Amount transferred:  69 ml Amount supplemented:  0 ml    Total amount transferred:  190 ml Total supplement given:  0 ml

## 2015-08-12 NOTE — Progress Notes (Signed)
Cardiology Office Note   Date:  08/13/2015   ID:  ROSANN GORUM, DOB 24-May-1976, MRN [id number]  PCP:  Lynne Logan, MD    Chief Complaint  Patient presents with  . PCV's      History of Present Illness: Tabbetha Kutscher is a 39 y.o. female with a history of palpitations and PVC's.  She takesToprol on a PRN basis.  She presents back today for followup. Since her delivery she has not had any further palpitations but has been complaining of dizziness that she describes as   Past Medical History  Diagnosis Date  . Hypothyroidism   . Iron deficiency   . Vitamin D deficiency   . Anemia   . Tachycardia     intermittent tachycardia  . Vaginal Pap smear, abnormal     colposcopy   . Asthma   . HSV infection     Past Surgical History  Procedure Laterality Date  . Removal of polyp from uterus    . Cesarean section N/A 04/21/2015    Procedure: CESAREAN SECTION;  Surgeon: Everett Graff, MD;  Location: [facility];  Service: Obstetrics;  Laterality: N/A;     Current Outpatient Prescriptions  Medication Sig Dispense Refill  . albuterol (PROVENTIL HFA;VENTOLIN HFA) 108 (90 BASE) MCG/ACT inhaler Inhale 2 puffs into the lungs every 6 (six) hours as needed for wheezing.    Marland Kitchen ibuprofen (ADVIL,MOTRIN) 600 MG tablet Take 1 tablet (600 mg total) by mouth every 6 (six) hours as needed. 30 tablet 1  . levothyroxine (SYNTHROID, LEVOTHROID) 75 MCG tablet Take 75 mcg by mouth daily before breakfast.    . Multiple Vitamins-Minerals (MULTIVITAMIN PO) Take 1 tablet by mouth daily.      No current facility-administered medications for this visit.    Allergies:   Review of patient's allergies indicates no known allergies.    Social History:  The patient  reports that she has never smoked. She has never used smokeless tobacco. She reports that she drinks alcohol. She reports that she does not use illicit drugs.   Family History:  The patient's family history includes Asthma in  her brother; Hypercholesterolemia in her mother; Hypertension in her father; Sarcoidosis in her mother; Sickle cell trait in her brother.    ROS:  Please see the history of present illness.   Otherwise, review of systems are positive for none.   All other systems are reviewed and negative.    PHYSICAL EXAM: VS:  BP 100/58 mmHg  Pulse 56  Ht 5\' 8"  (1.727 m)  Wt 177 lb (80.287 kg)  BMI 26.92 kg/m2  SpO2 96%  Breastfeeding? Yes , BMI Body mass index is 26.92 kg/(m^2). GEN: Well nourished, well developed, in no acute distress HEENT: normal Neck: no JVD, carotid bruits, or masses Cardiac: RRR; no murmurs, rubs, or gallops,no edema  Respiratory:  clear to auscultation bilaterally, normal work of breathing GI: soft, nontender, nondistended, + BS MS: no deformity or atrophy Skin: warm and dry, no rash Neuro:  Strength and sensation are intact Psych: euthymic mood, full affect   EKG:  EKG was not ordered today   Recent Labs: 04/21/2015: ALT 13*; BUN 6; Creatinine, Ser 0.76; Potassium 3.5; Sodium 135 04/22/2015: Hemoglobin 10.8*; Platelets 273    Lipid Panel No results found for: CHOL, TRIG, HDL, CHOLHDL, VLDL, LDLCALC, LDLDIRECT    Wt Readings from Last 3 Encounters:  08/13/15 177 lb (  80.287 kg)  04/21/15 207 lb (93.895 kg)  02/22/15 196 lb (88.905 kg)     ASSESSMENT AND PLAN:   1.Palpitations - PVC'scontrolled on PRN Toprol   2.  SOB with normal 2D echo.Resolved after delivery  3.PVC's - 2D echo with normal LVF.   4.  Dizziness that sounds like vertigo.  She is not orthostatic on exam.  She is going to discuss this with her PCP today.   Current medicines are reviewed at length with the patient today.  The patient does not have concerns regarding medicines.  The following changes have been made:  no change  Labs/ tests ordered today: See above Assessment and Plan No orders of the defined types were placed in this encounter.     Disposition:   FU with me  in 1 year  Signed, Sueanne Margarita, MD  08/13/2015 9:17 AM    [facility] [address], [place], [place]  [address] Phone: [phone]; Fax: [phone]

## 2015-08-13 ENCOUNTER — Ambulatory Visit (INDEPENDENT_AMBULATORY_CARE_PROVIDER_SITE_OTHER): Payer: Commercial Managed Care - PPO | Admitting: Cardiology

## 2015-08-13 ENCOUNTER — Encounter: Payer: Self-pay | Admitting: Cardiology

## 2015-08-13 VITALS — BP 100/58 | HR 56 | Ht 68.0 in | Wt 177.0 lb

## 2015-08-13 DIAGNOSIS — R002 Palpitations: Secondary | ICD-10-CM

## 2015-08-13 DIAGNOSIS — I493 Ventricular premature depolarization: Secondary | ICD-10-CM | POA: Diagnosis not present

## 2015-08-13 DIAGNOSIS — R0602 Shortness of breath: Secondary | ICD-10-CM | POA: Diagnosis not present

## 2015-08-13 NOTE — Patient Instructions (Signed)

## 2015-10-21 ENCOUNTER — Other Ambulatory Visit: Payer: Self-pay | Admitting: Obstetrics and Gynecology

## 2015-10-21 ENCOUNTER — Other Ambulatory Visit (HOSPITAL_COMMUNITY)
Admission: RE | Admit: 2015-10-21 | Discharge: 2015-10-21 | Disposition: A | Discharge status: Home / Self Care | Payer: Commercial Managed Care - PPO | Source: Ambulatory Visit | Attending: Obstetrics and Gynecology | Admitting: Obstetrics and Gynecology

## 2015-10-21 DIAGNOSIS — Z1151 Encounter for screening for human papillomavirus (HPV): Secondary | ICD-10-CM | POA: Insufficient documentation

## 2015-10-21 DIAGNOSIS — Z01411 Encounter for gynecological examination (general) (routine) with abnormal findings: Secondary | ICD-10-CM | POA: Insufficient documentation

## 2015-10-22 LAB — CYTOLOGY - PAP

## 2015-11-06 ENCOUNTER — Encounter: Payer: Self-pay | Admitting: Neurology

## 2015-11-06 ENCOUNTER — Ambulatory Visit (INDEPENDENT_AMBULATORY_CARE_PROVIDER_SITE_OTHER): Payer: Commercial Managed Care - PPO | Admitting: Neurology

## 2015-11-06 VITALS — BP 121/78 | HR 60 | Ht 68.0 in | Wt 172.0 lb

## 2015-11-06 DIAGNOSIS — R51 Headache: Secondary | ICD-10-CM | POA: Diagnosis not present

## 2015-11-06 DIAGNOSIS — R519 Headache, unspecified: Secondary | ICD-10-CM

## 2015-11-06 DIAGNOSIS — G43909 Migraine, unspecified, not intractable, without status migrainosus: Secondary | ICD-10-CM | POA: Insufficient documentation

## 2015-11-06 DIAGNOSIS — G43009 Migraine without aura, not intractable, without status migrainosus: Secondary | ICD-10-CM | POA: Diagnosis not present

## 2015-11-06 MED ORDER — SUMATRIPTAN SUCCINATE 50 MG PO TABS: 50.0000 mg | ORAL_TABLET | ORAL | Status: DC | PRN

## 2015-11-06 NOTE — Progress Notes (Signed)
PATIENT: Janice Flores DOB: [birth year]  Chief Complaint  Patient presents with  . Headache    Earlier this month, she had daily headaches for two weeks.  The frequency has decreased but she is still having intermittent episodes of sharp, sudden pain in her head that only last for a short period of time.  She is not using any medications for her pain.     HISTORICAL  Janice Flores is a 39 year old right-handed female alone at today's clinical visit, seen in refer by  her primary care physician Dr.Vyvyan Nancy Flores for evaluation of headaches November 06 2015   I have reviewed and summarize her most recent office note, she had a history of hypothyroidism, intermittent asthma, iron deficiency anemia, vitamin D deficiency, idiopathic scoliosis of lumbar region, history of herpes simplex type II, PVCs  Laboratory November 2016, hemoglobin 11 point 8, normal TSH 3.35, negative RPR, normal CBC, ferritin 20 5.1, vitamin D was 34.5  She had a history of headaches since 2013, she reported sudden onset transient sharp headache lasting for few seconds with sudden positional change, for a while, it has improved, but since October 2016, she began to have frequent occurrence, multiple episodes in a day, usually happened with sudden positional change, turning her head, bending over, getting up from bed  In addition she also had a history of migraine headaches, above-mentioned headache is different from her typical migraine, her typical migraine left frontal behind eye severe pounding headache with associated light noise sensitivity, nauseous, lasting for 1 day,   She is currently still nursing her 80-month-old son, tried Tylenol for migraine with limited help,   REVIEW OF SYSTEMS: Full 14 system review of systems performed and notable only for headaches  ALLERGIES: Allergies  Allergen Reactions  . Other Swelling    cherries    HOME MEDICATIONS: Current Outpatient Prescriptions  Medication Sig  Dispense Refill  . levothyroxine (SYNTHROID, LEVOTHROID) 75 MCG tablet Take 75 mcg by mouth daily before breakfast.     No current facility-administered medications for this visit.    PAST MEDICAL HISTORY: Past Medical History  Diagnosis Date  . Hypothyroidism   . Iron deficiency   . Vitamin D deficiency   . Anemia   . Tachycardia     intermittent tachycardia  . Vaginal Pap smear, abnormal     colposcopy   . Asthma   . HSV infection   . Headache     PAST SURGICAL HISTORY: Past Surgical History  Procedure Laterality Date  . Removal of polyp from uterus    . Cesarean section N/A 04/21/2015    Procedure: CESAREAN SECTION;  Surgeon: Janice Graff, MD;  Location: [facility];  Service: Obstetrics;  Laterality: N/A;    FAMILY HISTORY: Family History  Problem Relation Age of Onset  . Hypertension Father   . Hypercholesterolemia Mother   . Sarcoidosis Mother   . Asthma Brother   . Sickle cell trait Brother     SOCIAL HISTORY:  Social History   Social History  . Marital Status: Single    Spouse Name: N/A  . Number of Children: 1  . Years of Education: BS   Occupational History  . Application programmer    Social History Main Topics  . Smoking status: Never Smoker   . Smokeless tobacco: Never Used  . Alcohol Use: Yes     Comment: occasional  . Drug Use: No  . Sexual Activity: Yes   Other Topics Concern  . Not  on file   Social History Narrative   Lives at home with her mother and son.   Right-handed.   Occasional soda.     PHYSICAL EXAM   Filed Vitals:   11/06/15 1628  BP: 121/78  Pulse: 60  Height: 5\' 8"  (1.727 m)  Weight: 172 lb (78.019 kg)    Not recorded      Body mass index is 26.16 kg/(m^2).  PHYSICAL EXAMNIATION:  Gen: NAD, conversant, well nourised, obese, well groomed                     Cardiovascular: Regular rate rhythm, no peripheral edema, warm, nontender. Eyes: Conjunctivae clear without exudates or hemorrhage Neck: Supple, no  carotid bruise. Pulmonary: Clear to auscultation bilaterally   NEUROLOGICAL EXAM:  MENTAL STATUS: Speech:    Speech is normal; fluent and spontaneous with normal comprehension.  Cognition:     Orientation to time, place and person     Normal recent and remote memory     Normal Attention span and concentration     Normal Language, naming, repeating,spontaneous speech     Fund of knowledge   CRANIAL NERVES: CN II: Visual fields are full to confrontation. Fundoscopic exam is normal with sharp discs and no vascular changes. Pupils are round equal and briskly reactive to light. CN III, IV, VI: extraocular movement are normal. No ptosis. CN V: Facial sensation is intact to pinprick in all 3 divisions bilaterally. Corneal responses are intact.  CN VII: Face is symmetric with normal eye closure and smile. CN VIII: Hearing is normal to rubbing fingers CN IX, X: Palate elevates symmetrically. Phonation is normal. CN XI: Head turning and shoulder shrug are intact CN XII: Tongue is midline with normal movements and no atrophy.  MOTOR: There is no pronator drift of out-stretched arms. Muscle bulk and tone are normal. Muscle strength is normal.  REFLEXES: Reflexes are 2+ and symmetric at the biceps, triceps, knees, and ankles. Plantar responses are flexor.  SENSORY: Intact to light touch, pinprick, position sense, and vibration sense are intact in fingers and toes.  COORDINATION: Rapid alternating movements and fine finger movements are intact. There is no dysmetria on finger-to-nose and heel-knee-shin.    GAIT/STANCE: Posture is normal. Gait is steady with normal steps, base, arm swing, and turning. Heel and toe walking are normal. Tandem gait is normal.  Romberg is absent.   DIAGNOSTIC DATA (LABS, IMAGING, TESTING) - I reviewed patient records, labs, notes, testing and imaging myself where available.   ASSESSMENT AND PLAN  Janice Flores is a 39 y.o. female   Chronic  migraine Ice prick headaches related to positional change  MRI of the brain to rule out structural lesion, such as chloride cyst blockade of the third ventricle  Imitrex as needed, I have looked up Micromedex, it is compatible with breast-feeding   Janice Flores, M.D. Ph.D.  [facility] [address] [place], [place] [address] Ph: [phone] Fax: [phone]  CC: Donald Prose, MD

## 2015-11-20 ENCOUNTER — Ambulatory Visit (INDEPENDENT_AMBULATORY_CARE_PROVIDER_SITE_OTHER): Payer: Commercial Managed Care - PPO

## 2015-11-20 DIAGNOSIS — G43009 Migraine without aura, not intractable, without status migrainosus: Secondary | ICD-10-CM | POA: Diagnosis not present

## 2015-11-20 DIAGNOSIS — R51 Headache: Secondary | ICD-10-CM

## 2015-11-20 DIAGNOSIS — R519 Headache, unspecified: Secondary | ICD-10-CM

## 2015-11-22 ENCOUNTER — Telehealth: Payer: Self-pay | Admitting: Neurology

## 2015-11-22 NOTE — Telephone Encounter (Signed)
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

## 2015-11-26 NOTE — Telephone Encounter (Signed)
Left message for a return call

## 2015-11-26 NOTE — Telephone Encounter (Signed)
Spoke to patient - aware of results. 

## 2015-12-12 ENCOUNTER — Encounter: Payer: Self-pay | Admitting: Neurology

## 2015-12-12 ENCOUNTER — Ambulatory Visit (INDEPENDENT_AMBULATORY_CARE_PROVIDER_SITE_OTHER): Payer: Commercial Managed Care - PPO | Admitting: Neurology

## 2015-12-12 VITALS — BP 108/60 | HR 62 | Ht 68.0 in | Wt 190.0 lb

## 2015-12-12 DIAGNOSIS — G43009 Migraine without aura, not intractable, without status migrainosus: Secondary | ICD-10-CM | POA: Diagnosis not present

## 2015-12-12 NOTE — Patient Instructions (Signed)
Magnesium oxide 400 mg twice a day Riboflavin  100 mg twice a day 

## 2015-12-12 NOTE — Progress Notes (Signed)
Chief Complaint  Patient presents with  . Migraine    Says she is doing better.  She has only had three headaches since her visit in Novmeber 2016.  Her pain responds well to sumatriptan.  She would like to review her MRI today.      PATIENT: Janice Flores DOB: September 16, 1976  Chief Complaint  Patient presents with  . Migraine    Says she is doing better.  She has only had three headaches since her visit in Novmeber 2016.  Her pain responds well to sumatriptan.  She would like to review her MRI today.     HISTORICAL  Janice Flores is a 40 year old right-handed female alone at today's clinical visit, seen in refer by  her primary care physician Dr.Vyvyan Nancy Flores for evaluation of headaches November 06 2015   I have reviewed and summarize her most recent office note, she had a history of hypothyroidism, intermittent asthma, iron deficiency anemia, vitamin D deficiency, idiopathic scoliosis of lumbar region, history of herpes simplex type II, PVCs  Laboratory November 2016, hemoglobin 11 point 8, normal TSH 3.35, negative RPR, normal CBC, ferritin 20 5.1, vitamin D was 34.5  She had a history of headaches since 2013, she reported sudden onset transient sharp headache lasting for few seconds with sudden positional change, for a while, it has improved, but since October 2016, she began to have frequent occurrence, multiple episodes in a day, usually happened with sudden positional change, turning her head, bending over, getting up from bed  In addition she also had a history of migraine headaches, above-mentioned headache is different from her typical migraine, her typical migraine left frontal behind eye severe pounding headache with associated light noise sensitivity, nauseous, lasting for 1 day,   She is currently still nursing her 45-month-old son, tried Tylenol for migraine with limited help,  UPDATE Dec 12 2015: She only had 2 or 3 severe migraine headaches since last visit, Imitrex as  needed was helpful, no significant side effect noticed.   REVIEW OF SYSTEMS: Full 14 system review of systems performed and notable only for headaches  ALLERGIES: Allergies  Allergen Reactions  . Other Swelling    cherries    HOME MEDICATIONS: Current Outpatient Prescriptions  Medication Sig Dispense Refill  . levothyroxine (SYNTHROID, LEVOTHROID) 75 MCG tablet Take 75 mcg by mouth daily before breakfast.    . Multiple Vitamins-Minerals (MULTIVITAMIN ADULT PO) Take by mouth daily.    . SUMAtriptan (IMITREX) 50 MG tablet Take 1 tablet (50 mg total) by mouth every 2 (two) hours as needed for migraine. May repeat in 2 hours if headache persists or recurs. 10 tablet 6   No current facility-administered medications for this visit.    PAST MEDICAL HISTORY: Past Medical History  Diagnosis Date  . Hypothyroidism   . Iron deficiency   . Vitamin D deficiency   . Anemia   . Tachycardia     intermittent tachycardia  . Vaginal Pap smear, abnormal     colposcopy   . Asthma   . HSV infection   . Headache     PAST SURGICAL HISTORY: Past Surgical History  Procedure Laterality Date  . Removal of polyp from uterus    . Cesarean section N/A 04/21/2015    Procedure: CESAREAN SECTION;  Surgeon: Everett Graff, MD;  Location: [facility];  Service: Obstetrics;  Laterality: N/A;    FAMILY HISTORY: Family History  Problem Relation Age of Onset  . Hypertension Father   . Hypercholesterolemia  Mother   . Sarcoidosis Mother   . Asthma Brother   . Sickle cell trait Brother     SOCIAL HISTORY:  Social History   Social History  . Marital Status: Single    Spouse Name: N/A  . Number of Children: 1  . Years of Education: BS   Occupational History  . Application programmer    Social History Main Topics  . Smoking status: Never Smoker   . Smokeless tobacco: Never Used  . Alcohol Use: Yes     Comment: occasional  . Drug Use: No  . Sexual Activity: Yes   Other Topics Concern  . Not  on file   Social History Narrative   Lives at home with her mother and son.   Right-handed.   Occasional soda.     PHYSICAL EXAM   Filed Vitals:   12/12/15 0839  BP: 108/60  Pulse: 62  Height: 5\' 8"  (1.727 m)  Weight: 190 lb (86.183 kg)    Not recorded      Body mass index is 28.9 kg/(m^2).  PHYSICAL EXAMNIATION:  Gen: NAD, conversant, well nourised, obese, well groomed                     Cardiovascular: Regular rate rhythm, no peripheral edema, warm, nontender. Eyes: Conjunctivae clear without exudates or hemorrhage Neck: Supple, no carotid bruise. Pulmonary: Clear to auscultation bilaterally   NEUROLOGICAL EXAM:  MENTAL STATUS: Speech:    Speech is normal; fluent and spontaneous with normal comprehension.  Cognition:     Orientation to time, place and person     Normal recent and remote memory     Normal Attention span and concentration     Normal Language, naming, repeating,spontaneous speech     Fund of knowledge   CRANIAL NERVES: CN II: Visual fields are full to confrontation. Fundoscopic exam is normal with sharp discs and no vascular changes. Pupils are round equal and briskly reactive to light. CN III, IV, VI: extraocular movement are normal. No ptosis. CN V: Facial sensation is intact to pinprick in all 3 divisions bilaterally. Corneal responses are intact.  CN VII: Face is symmetric with normal eye closure and smile. CN VIII: Hearing is normal to rubbing fingers CN IX, X: Palate elevates symmetrically. Phonation is normal. CN XI: Head turning and shoulder shrug are intact CN XII: Tongue is midline with normal movements and no atrophy.  MOTOR: There is no pronator drift of out-stretched arms. Muscle bulk and tone are normal. Muscle strength is normal.  REFLEXES: Reflexes are 2+ and symmetric at the biceps, triceps, knees, and ankles. Plantar responses are flexor.  SENSORY: Intact to light touch, pinprick, position sense, and vibration sense are  intact in fingers and toes.  COORDINATION: Rapid alternating movements and fine finger movements are intact. There is no dysmetria on finger-to-nose and heel-knee-shin.    GAIT/STANCE: Posture is normal. Gait is steady with normal steps, base, arm swing, and turning. Heel and toe walking are normal. Tandem gait is normal.  Romberg is absent.   DIAGNOSTIC DATA (LABS, IMAGING, TESTING) - I reviewed patient records, labs, notes, testing and imaging myself where available.   ASSESSMENT AND PLAN  Lakynn Brietzke Clingenpeel is a 40 y.o. female   Chronic migraine  MR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, consistent with Arnold-Chiari malformation type I, but no evidence of compression  I have suggested magnesium oxide, riboflavin as preventive medication  Continue Imitrex as needed for abortive treatment  Only return to clinic for new issues   Marcial Pacas, M.D. Ph.D.  [facility] [address], [place], [place] [address] Ph: [phone] Fax: [phone]  CC: Donald Prose, MD

## 2015-12-25 ENCOUNTER — Other Ambulatory Visit: Payer: Self-pay | Admitting: Family Medicine

## 2015-12-25 DIAGNOSIS — N6320 Unspecified lump in the left breast, unspecified quadrant: Secondary | ICD-10-CM

## 2016-02-05 ENCOUNTER — Ambulatory Visit
Admission: RE | Admit: 2016-02-05 | Discharge: 2016-02-05 | Disposition: A | Discharge status: Home / Self Care | Payer: Commercial Managed Care - PPO | Source: Ambulatory Visit | Attending: Family Medicine | Admitting: Family Medicine

## 2016-02-05 DIAGNOSIS — N6320 Unspecified lump in the left breast, unspecified quadrant: Secondary | ICD-10-CM

## 2016-08-13 ENCOUNTER — Encounter: Payer: Self-pay | Admitting: Cardiology

## 2016-08-13 ENCOUNTER — Encounter (INDEPENDENT_AMBULATORY_CARE_PROVIDER_SITE_OTHER): Payer: Self-pay

## 2016-08-13 ENCOUNTER — Ambulatory Visit (INDEPENDENT_AMBULATORY_CARE_PROVIDER_SITE_OTHER): Payer: Commercial Managed Care - PPO | Admitting: Cardiology

## 2016-08-13 VITALS — BP 126/74 | HR 57 | Ht 68.0 in | Wt 184.0 lb

## 2016-08-13 DIAGNOSIS — I493 Ventricular premature depolarization: Secondary | ICD-10-CM

## 2016-08-13 DIAGNOSIS — R002 Palpitations: Secondary | ICD-10-CM | POA: Diagnosis not present

## 2016-08-13 DIAGNOSIS — R0602 Shortness of breath: Secondary | ICD-10-CM | POA: Diagnosis not present

## 2016-08-13 DIAGNOSIS — R001 Bradycardia, unspecified: Secondary | ICD-10-CM

## 2016-08-13 HISTORY — DX: Bradycardia, unspecified: R00.1

## 2016-08-13 NOTE — Patient Instructions (Signed)
Medication Instructions:  Your physician recommends that you continue on your current medications as directed. Please refer to the Current Medication list given to you today.   Labwork: None  Testing/Procedures: Your physician has requested that you have an echocardiogram. Echocardiography is a painless test that uses sound waves to create images of your heart. It provides your doctor with information about the size and shape of your heart and how well your heart's chambers and valves are working. This procedure takes approximately one hour. There are no restrictions for this procedure.   Your physician has recommended that you wear an event monitor. Event monitors are medical devices that record the heart's electrical activity. Doctors most often [place] these monitors to diagnose arrhythmias. Arrhythmias are problems with the speed or rhythm of the heartbeat. The monitor is a small, portable device. You can wear one while you do your normal daily activities. This is usually used to diagnose what is causing palpitations/syncope (passing out).  Follow-Up: Your physician wants you to follow-up in: 1 year with Dr. Radford Pax. You will receive a reminder letter in the mail two months in advance. If you don't receive a letter, please call our office to schedule the follow-up appointment.   Any Other Special Instructions Will Be Listed Below (If Applicable).     If you need a refill on your cardiac medications before your next appointment, please call your pharmacy.

## 2016-08-13 NOTE — Progress Notes (Signed)
Cardiology Office Note    Date:  08/13/2016   ID:  [name], DOB 10-31-1976, MRN [id number]  PCP:  Lynne Logan, MD  Cardiologist:  Fransico Him, MD   Chief Complaint  Patient presents with  . Palpitations  . Shortness of Breath    History of Present Illness:  Janice Flores is a 40 y.o. female with a history of palpitations and PVC's.  She takesToprol on a PRN basis.  She presents back today for followup. She is doing well.  She denies any chest pain, LE edema or syncope. She has noticed increasing frequency of palpitations and now feel like a fluttering.  She says they take her breath away which are new symptoms.  She also has had some lightheaded spells but not necessarily at the time of the palpitations.  She has not had any near syncope.  She has also had some SOB for the past few months that can occur just sitting or laying down.  Sometimes it occurs with the palpitations.    Past Medical History:  Diagnosis Date  . Anemia   . Asthma   . Bradycardia 08/13/2016  . Headache   . HSV infection   . Hypothyroidism   . Iron deficiency   . Tachycardia    intermittent tachycardia  . Vaginal Pap smear, abnormal    colposcopy   . Vitamin D deficiency     Past Surgical History:  Procedure Laterality Date  . CESAREAN SECTION N/A 04/21/2015   Procedure: CESAREAN SECTION;  Surgeon: Everett Graff, MD;  Location: [facility];  Service: Obstetrics;  Laterality: N/A;  . removal of polyp from uterus      Current Medications: Outpatient Medications Prior to Visit  Medication Sig Dispense Refill  . levothyroxine (SYNTHROID, LEVOTHROID) 75 MCG tablet Take 75 mcg by mouth daily before breakfast.    . Multiple Vitamins-Minerals (MULTIVITAMIN ADULT PO) Take by mouth daily.    . SUMAtriptan (IMITREX) 50 MG tablet Take 1 tablet (50 mg total) by mouth every 2 (two) hours as needed for migraine. May repeat in 2 hours if headache persists or recurs. 10 tablet 6   No  facility-administered medications prior to visit.      Allergies:   Other   Social History   Social History  . Marital status: Single    Spouse name: N/A  . Number of children: 1  . Years of education: BS   Occupational History  . Application programmer    Social History Main Topics  . Smoking status: Never Smoker  . Smokeless tobacco: Never Used  . Alcohol use Yes     Comment: occasional  . Drug use: No  . Sexual activity: Yes   Other Topics Concern  . None   Social History Narrative   Lives at home with her mother and son.   Right-handed.   Occasional soda.     Family History:  The patient's family history includes Asthma in her brother; Hypercholesterolemia in her mother; Hypertension in her father; Sarcoidosis in her mother; Sickle cell trait in her brother.   ROS:   Please see the history of present illness.    ROS All other systems reviewed and are negative.   PHYSICAL EXAM:   VS:  BP 126/74   Pulse (!) 57   Ht 5\' 8"  (1.727 m)   Wt 184 lb (83.5 kg)   BMI 27.98 kg/m    GEN: Well nourished, well developed, in no acute distress  HEENT:  normal  Neck: no JVD, carotid bruits, or masses Cardiac: RRR; no murmurs, rubs, or gallops,no edema.  Intact distal pulses bilaterally.  Respiratory:  clear to auscultation bilaterally, normal work of breathing GI: soft, nontender, nondistended, + BS MS: no deformity or atrophy  Skin: warm and dry, no rash Neuro:  Alert and Oriented x 3, Strength and sensation are intact Psych: euthymic mood, full affect  Wt Readings from Last 3 Encounters:  08/13/16 184 lb (83.5 kg)  12/12/15 190 lb (86.2 kg)  11/18/15 172 lb (78 kg)      Studies/Labs Reviewed:   EKG:  EKG is ordered today.  The ekg ordered today demonstrates sinus bradycardia at 56bpm with no ST changes.    Recent Labs: No results found for requested labs within last 8760 hours.   Lipid Panel No results found for: CHOL, TRIG, HDL, CHOLHDL, VLDL, LDLCALC,  LDLDIRECT  Additional studies/ records that were reviewed today include:  none    ASSESSMENT:    1. PVC's (premature ventricular contractions)   2. SOB (shortness of breath)   3. Heart palpitations   4. Bradycardia      PLAN:  In order of problems listed above:  1. PVCs - she has had increased palpitations so not sure if those are due to PVC's 2. SOB ? Etiology - may be related to arrhythmias.  Will check 2D echo to assess LVF. 3. Heart palpitations ? PVC's but need to rule out other arrhythmias.  Will get an event monitor to assess for PAF. 4. Bradycardia - she has not been taking the BB.  She has had some mild episodes of dizziness ? Related - will try to correlate with heart monitor.      Medication Adjustments/Labs and Tests Ordered: Current medicines are reviewed at length with the patient today.  Concerns regarding medicines are outlined above.  Medication changes, Labs and Tests ordered today are listed in the Patient Instructions below.  There are no Patient Instructions on file for this visit.   Signed, Fransico Him, MD  08/13/2016 9:09 AM    [facility] [address], [place], [place]  [address] Phone: [phone]; Fax: [phone]

## 2016-08-14 NOTE — Addendum Note (Signed)
Addended by: Patterson Hammersmith A on: 08/14/2016 03:21 PM   Modules accepted: Orders

## 2016-08-14 NOTE — Addendum Note (Signed)
Addended by: Patterson Hammersmith A on: 08/14/2016 01:40 PM   Modules accepted: Orders

## 2016-08-17 ENCOUNTER — Encounter: Payer: Self-pay | Admitting: Cardiology

## 2016-08-18 ENCOUNTER — Encounter (INDEPENDENT_AMBULATORY_CARE_PROVIDER_SITE_OTHER): Payer: Self-pay

## 2016-08-18 ENCOUNTER — Ambulatory Visit (INDEPENDENT_AMBULATORY_CARE_PROVIDER_SITE_OTHER): Payer: Commercial Managed Care - PPO

## 2016-08-18 DIAGNOSIS — R001 Bradycardia, unspecified: Secondary | ICD-10-CM | POA: Diagnosis not present

## 2016-08-18 DIAGNOSIS — R002 Palpitations: Secondary | ICD-10-CM

## 2016-08-18 DIAGNOSIS — I493 Ventricular premature depolarization: Secondary | ICD-10-CM

## 2016-08-20 ENCOUNTER — Other Ambulatory Visit: Payer: Self-pay | Admitting: Obstetrics and Gynecology

## 2016-08-20 DIAGNOSIS — N6320 Unspecified lump in the left breast, unspecified quadrant: Secondary | ICD-10-CM

## 2016-08-26 ENCOUNTER — Other Ambulatory Visit: Payer: Self-pay

## 2016-08-26 ENCOUNTER — Ambulatory Visit (HOSPITAL_COMMUNITY): Payer: Commercial Managed Care - PPO | Attending: Cardiology

## 2016-08-26 DIAGNOSIS — I371 Nonrheumatic pulmonary valve insufficiency: Secondary | ICD-10-CM | POA: Diagnosis not present

## 2016-08-26 DIAGNOSIS — I493 Ventricular premature depolarization: Secondary | ICD-10-CM

## 2016-08-26 DIAGNOSIS — I071 Rheumatic tricuspid insufficiency: Secondary | ICD-10-CM | POA: Diagnosis not present

## 2016-08-26 DIAGNOSIS — R0602 Shortness of breath: Secondary | ICD-10-CM | POA: Insufficient documentation

## 2016-08-26 DIAGNOSIS — I340 Nonrheumatic mitral (valve) insufficiency: Secondary | ICD-10-CM | POA: Insufficient documentation

## 2016-09-11 ENCOUNTER — Ambulatory Visit: Payer: Commercial Managed Care - PPO

## 2016-10-08 ENCOUNTER — Ambulatory Visit
Admission: RE | Admit: 2016-10-08 | Discharge: 2016-10-08 | Disposition: A | Discharge status: Home / Self Care | Payer: Commercial Managed Care - PPO | Source: Ambulatory Visit | Attending: Obstetrics and Gynecology | Admitting: Obstetrics and Gynecology

## 2016-10-08 DIAGNOSIS — N6320 Unspecified lump in the left breast, unspecified quadrant: Secondary | ICD-10-CM

## 2016-10-22 ENCOUNTER — Other Ambulatory Visit: Payer: Self-pay | Admitting: Obstetrics and Gynecology

## 2016-10-22 ENCOUNTER — Other Ambulatory Visit (HOSPITAL_COMMUNITY)
Admission: RE | Admit: 2016-10-22 | Discharge: 2016-10-22 | Disposition: A | Discharge status: Home / Self Care | Payer: Commercial Managed Care - PPO | Source: Ambulatory Visit | Attending: Obstetrics and Gynecology | Admitting: Obstetrics and Gynecology

## 2016-10-22 DIAGNOSIS — Z01411 Encounter for gynecological examination (general) (routine) with abnormal findings: Secondary | ICD-10-CM | POA: Diagnosis present

## 2016-10-22 DIAGNOSIS — Z1151 Encounter for screening for human papillomavirus (HPV): Secondary | ICD-10-CM | POA: Insufficient documentation

## 2016-10-28 LAB — CYTOLOGY - PAP: HPV: NOT DETECTED

## 2016-11-06 ENCOUNTER — Emergency Department (HOSPITAL_COMMUNITY): Payer: Commercial Managed Care - PPO

## 2016-11-06 ENCOUNTER — Emergency Department (HOSPITAL_COMMUNITY)
Admission: EM | Admit: 2016-11-06 | Discharge: 2016-11-06 | Disposition: A | Discharge status: Home / Self Care | Payer: Commercial Managed Care - PPO | Attending: Emergency Medicine | Admitting: Emergency Medicine

## 2016-11-06 ENCOUNTER — Encounter (HOSPITAL_COMMUNITY): Payer: Self-pay | Admitting: *Deleted

## 2016-11-06 DIAGNOSIS — R0789 Other chest pain: Secondary | ICD-10-CM | POA: Diagnosis not present

## 2016-11-06 DIAGNOSIS — J45909 Unspecified asthma, uncomplicated: Secondary | ICD-10-CM | POA: Insufficient documentation

## 2016-11-06 DIAGNOSIS — E039 Hypothyroidism, unspecified: Secondary | ICD-10-CM | POA: Diagnosis not present

## 2016-11-06 DIAGNOSIS — R079 Chest pain, unspecified: Secondary | ICD-10-CM

## 2016-11-06 DIAGNOSIS — R072 Precordial pain: Secondary | ICD-10-CM | POA: Diagnosis present

## 2016-11-06 LAB — I-STAT BETA HCG BLOOD, ED (MC, WL, AP ONLY): I-stat hCG, quantitative: <5 m[IU]/mL (ref <5)

## 2016-11-06 LAB — BASIC METABOLIC PANEL
Anion gap: 8 (ref 5–15)
BUN: 7 mg/dL (ref 6–20)
CO2: 25 mmol/L (ref 22–32)
Calcium: 8.9 mg/dL (ref 8.9–10.3)
Chloride: 104 mmol/L (ref 101–111)
Creatinine, Ser: 0.71 mg/dL (ref 0.44–1.00)
GFR calc Af Amer: >60 mL/min (ref >60)
GFR calc non Af Amer: >60 mL/min (ref >60)
Glucose, Bld: 82 mg/dL (ref 65–99)
Potassium: 3.6 mmol/L (ref 3.5–5.1)
Sodium: 137 mmol/L (ref 135–145)

## 2016-11-06 LAB — CBC
HCT: 35.6 % — ABNORMAL LOW (ref 36.0–46.0)
Hemoglobin: 11.7 g/dL — ABNORMAL LOW (ref 12.0–15.0)
MCH: 29.2 pg (ref 26.0–34.0)
MCHC: 32.9 g/dL (ref 30.0–36.0)
MCV: 88.8 fL (ref 78.0–100.0)
Platelets: 289 10*3/uL (ref 150–400)
RBC: 4.01 MIL/uL (ref 3.87–5.11)
RDW: 14.1 % (ref 11.5–15.5)
WBC: 3.8 10*3/uL — ABNORMAL LOW (ref 4.0–10.5)

## 2016-11-06 LAB — TROPONIN I: Troponin I: <0.03 ng/mL (ref <0.03)

## 2016-11-06 LAB — I-STAT TROPONIN, ED: Troponin i, poc: 0 ng/mL (ref 0.00–0.08)

## 2016-11-06 NOTE — ED Triage Notes (Signed)
Pt reports episodes of mid chest pain and dizziness since yesterday. Went to [facility] and sent here for further eval. Is pain free at this time. On recent antibiotics for cold symptoms.

## 2016-11-06 NOTE — ED Notes (Signed)
E-sig not available, pt verbalized understanding of DC instructions

## 2016-11-06 NOTE — ED Provider Notes (Signed)
[facility] DEPT Provider Note   CSN: [id number] Arrival date & time: 11/06/16  1435     History   Chief Complaint Chief Complaint  Patient presents with  . Chest Pain    HPI Janice Flores is a 40 y.o. female.   Chest Pain   This is a new problem. The current episode started 3 to 5 hours ago. The problem occurs constantly. The problem has been resolved. The pain is present in the substernal region. The pain is moderate. The quality of the pain is described as brief and pressure-like. The pain does not radiate. Pertinent negatives include no abdominal pain, no cough, no fever, no nausea and no shortness of breath.    Past Medical History:  Diagnosis Date  . Anemia   . Asthma   . Bradycardia 08/13/2016  . Headache   . HSV infection   . Hypothyroidism   . Iron deficiency   . Tachycardia    intermittent tachycardia  . Vaginal Pap smear, abnormal    colposcopy   . Vitamin D deficiency     Patient Active Problem List   Diagnosis Date Noted  . Bradycardia 08/13/2016  . Migraine 11/06/2015  . Episodic headache 11/06/2015  . 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 Advanced maternal age, 1st pregnancy 04/21/2015  . Positive GBS test 04/21/2015  . Normal labor 04/21/2015  . SOB (shortness of breath) 02/22/2015  . Heart palpitations 02/22/2015  . PVC's (premature ventricular contractions) 09/28/2013  . Hypothyroidism   . Iron deficiency   . Anemia     Past Surgical History:  Procedure Laterality Date  . CESAREAN SECTION N/A 04/21/2015   Procedure: CESAREAN SECTION;  Surgeon: Everett Graff, MD;  Location: [facility];  Service: Obstetrics;  Laterality: N/A;  . removal of polyp from uterus      OB History    Gravida Para Term Preterm AB Living   1 1 1      0   SAB TAB Ectopic Multiple Live Births         0         Home Medications    Prior to Admission medications   Medication Sig Start Date End Date Taking? Authorizing Provider  levothyroxine (SYNTHROID, LEVOTHROID) 75  MCG tablet Take 75 mcg by mouth daily before breakfast.    Historical Provider, MD  Multiple Vitamins-Minerals (MULTIVITAMIN ADULT PO) Take by mouth daily.    Historical Provider, MD  SUMAtriptan (IMITREX) 50 MG tablet Take 1 tablet (50 mg total) by mouth every 2 (two) hours as needed for migraine. May repeat in 2 hours if headache persists or recurs. 11/06/15   Marcial Pacas, MD    Family History Family History  Problem Relation Age of Onset  . Hypertension Father   . Hypercholesterolemia Mother   . Sarcoidosis Mother   . Asthma Brother   . Sickle cell trait Brother     Social History Social History  Substance Use Topics  . Smoking status: Never Smoker  . Smokeless tobacco: Never Used  . Alcohol use Yes     Comment: occasional     Allergies   Other   Review of Systems Review of Systems  Constitutional: Negative for fatigue and fever.  HENT: Negative for congestion.   Eyes: Negative for redness.  Respiratory: Negative for cough and shortness of breath.   Cardiovascular: Positive for chest pain.  Gastrointestinal: Negative for abdominal pain and nausea.  Endocrine: Negative for polydipsia and polyuria.  Genitourinary: Negative for dysuria.  All other systems reviewed and are negative.    Physical Exam Updated Vital Signs BP 114/68   Pulse (!) 55   Temp 98.2 F (36.8 C) (Oral)   Resp 18   LMP 11/06/2016   SpO2 100%   Physical Exam  Constitutional: She is oriented to person, place, and time. She appears well-developed and well-nourished.  HENT:  Head: Normocephalic and atraumatic.  Eyes: Conjunctivae and EOM are normal.  Neck: Normal range of motion.  Cardiovascular: Normal rate and regular rhythm.   Pulmonary/Chest: Effort normal and breath sounds normal. No stridor. No respiratory distress. She has no wheezes.  Abdominal: Soft. There is no tenderness.  Neurological: She is alert and oriented to person, place, and time. No cranial nerve deficit. Coordination  normal.  Skin: Skin is warm and dry. No pallor.  Nursing note and vitals reviewed.    ED Treatments / Results  Labs (all labs ordered are listed, but only abnormal results are displayed) Labs Reviewed  CBC - Abnormal; Notable for the following:       Result Value   WBC 3.8 (*)    Hemoglobin 11.7 (*)    HCT 35.6 (*)    All other components within normal limits  BASIC METABOLIC PANEL  TROPONIN I  I-STAT TROPOININ, ED  I-STAT BETA HCG BLOOD, ED ([facility], [facility], [facility] ONLY)    EKG  EKG Interpretation  Date/Time:  Friday November 06 2016 14:47:31 EST Ventricular Rate:  60 PR Interval:  156 QRS Duration: 98 QT Interval:  412 QTC Calculation: 412 R Axis:   135 Text Interpretation:  Normal sinus rhythm Right axis deviation Septal infarct , age undetermined Abnormal ECG No significant change since last tracing august, 2014 Confirmed by [facility] MD, Daquana Paddock [id number]) on 11/06/2016 6:04:25 PM       Radiology Dg Chest 2 View  Result Date: 11/06/2016 CLINICAL DATA:  Chest pain EXAM: CHEST  2 VIEW COMPARISON:  August 01, 2013 FINDINGS: There is no edema or consolidation. The heart size and pulmonary vascularity are normal. No adenopathy. There is thoracolumbar levoscoliosis. No pneumothorax. IMPRESSION: No edema or consolidation. Electronically Signed   By: Lowella Grip III M.D.   On: 11/06/2016 15:41    Procedures Procedures (including critical care time)  Medications Ordered in ED Medications - No data to display   Initial Impression / Assessment and Plan / ED Course  I have reviewed the triage vital signs and the nursing notes.  Pertinent labs & imaging results that were available during my care of the patient were reviewed by me and considered in my medical decision making (see chart for details).  Clinical Course     40 year old female with atypical chest pain. Delta troponins negative low concern for ACS. She's pertinent negatives so I doubt pulmonary embolus. No history of  reflux. Chest x-rays Ok so I doubt pneumonia, dissection or pneumothorax.  Chest pain-free the whole time she is in the emergency department no arrhythmias on monitor plan for discharge and PCP follow-up.  Final Clinical Impressions(s) / ED Diagnoses   Final diagnoses:  Nonspecific chest pain    New Prescriptions Discharge Medication List as of 11/06/2016  9:28 PM       Merrily Pew, MD 11/06/16 2338

## 2017-04-05 ENCOUNTER — Ambulatory Visit (INDEPENDENT_AMBULATORY_CARE_PROVIDER_SITE_OTHER): Payer: Commercial Managed Care - PPO | Admitting: Physician Assistant

## 2017-04-05 ENCOUNTER — Encounter (INDEPENDENT_AMBULATORY_CARE_PROVIDER_SITE_OTHER): Payer: Self-pay

## 2017-04-05 ENCOUNTER — Encounter: Payer: Self-pay | Admitting: Physician Assistant

## 2017-04-05 VITALS — BP 114/62 | HR 67 | Ht 67.0 in | Wt 179.8 lb

## 2017-04-05 DIAGNOSIS — I10 Essential (primary) hypertension: Secondary | ICD-10-CM | POA: Diagnosis not present

## 2017-04-05 DIAGNOSIS — E039 Hypothyroidism, unspecified: Secondary | ICD-10-CM | POA: Diagnosis not present

## 2017-04-05 DIAGNOSIS — R002 Palpitations: Secondary | ICD-10-CM

## 2017-04-05 LAB — COMPREHENSIVE METABOLIC PANEL
ALT: 10 IU/L (ref 0–32)
AST: 13 IU/L (ref 0–40)
Albumin/Globulin Ratio: 1.5 (ref 1.2–2.2)
Albumin: 4.6 g/dL (ref 3.5–5.5)
Alkaline Phosphatase: 39 IU/L (ref 39–117)
BUN/Creatinine Ratio: 12 (ref 9–23)
BUN: 9 mg/dL (ref 6–24)
Bilirubin Total: 1.3 mg/dL — ABNORMAL HIGH (ref 0.0–1.2)
CO2: 21 mmol/L (ref 18–29)
Calcium: 9.4 mg/dL (ref 8.7–10.2)
Chloride: 99 mmol/L (ref 96–106)
Creatinine, Ser: 0.76 mg/dL (ref 0.57–1.00)
GFR calc Af Amer: 111 mL/min/{1.73_m2} (ref >59)
GFR calc non Af Amer: 96 mL/min/{1.73_m2} (ref >59)
Globulin, Total: 3 g/dL (ref 1.5–4.5)
Glucose: 77 mg/dL (ref 65–99)
Potassium: 4 mmol/L (ref 3.5–5.2)
Sodium: 137 mmol/L (ref 134–144)
Total Protein: 7.6 g/dL (ref 6.0–8.5)

## 2017-04-05 LAB — CBC
Hematocrit: 36 % (ref 34.0–46.6)
Hemoglobin: 11.9 g/dL (ref 11.1–15.9)
MCH: 29.5 pg (ref 26.6–33.0)
MCHC: 33.1 g/dL (ref 31.5–35.7)
MCV: 89 fL (ref 79–97)
NRBC: 1 % — ABNORMAL HIGH (ref 0–0)
Platelets: 328 10*3/uL (ref 150–379)
RBC: 4.04 x10E6/uL (ref 3.77–5.28)
RDW: 12.9 % (ref 12.3–15.4)
WBC: 2.9 10*3/uL — ABNORMAL LOW (ref 3.4–10.8)

## 2017-04-05 LAB — TSH: TSH: 1.34 u[IU]/mL (ref 0.450–4.500)

## 2017-04-05 MED ORDER — METOPROLOL SUCCINATE ER 25 MG PO TB24: 25.0000 mg | ORAL_TABLET | Freq: Every day | ORAL | 3 refills | Status: DC

## 2017-04-05 NOTE — Progress Notes (Signed)
.   Cardiology Office Note    Date:  04/05/2017   ID:  Janice Flores, DOB 01-04-1976, MRN [id number]  PCP:  Lynne Logan, MD  Cardiologist: Dr. Radford Pax  Chief Complaint  Patient presents with  . Palpitations    History of Present Illness:  Janice Flores is a 41 y.o. female with history of hypertension, palpitations and PVCs, bradycardia last seen by Dr. Radford Pax 08/13/16. 2-D echo was ordered and showed normal LVEF 55-60% with trivial MR. Event monitor showed normal sinus rhythm rate 60-100 bpm with occasional PVCs, some bigeminal PVCs and PACs which were benign.Was in the ED 11/06/16 with atypical chest pain, troponins negative and mildly anemic.   Patient comes in today because she had 2 weeks of increased palpitations which have now resolved. She says that was worse in the mornings she just felt a skipping. She had no associated chest pain, dizziness or presyncope. She denied any increase in caffeine, use of over-the-counter medications, increased stress, or lack of sleep. She denies exertional symptoms. She used to take Toprol when necessary but hasn't used in over 5 years.   Past Medical History:  Diagnosis Date  . Anemia   . Asthma   . Bradycardia 08/13/2016  . Headache   . HSV infection   . Hypothyroidism   . Iron deficiency   . Tachycardia    intermittent tachycardia  . Vaginal Pap smear, abnormal    colposcopy   . Vitamin D deficiency     Past Surgical History:  Procedure Laterality Date  . CESAREAN SECTION N/A 04/21/2015   Procedure: CESAREAN SECTION;  Surgeon: Everett Graff, MD;  Location: [facility];  Service: Obstetrics;  Laterality: N/A;  . removal of polyp from uterus      Current Medications: Outpatient Medications Prior to Visit  Medication Sig Dispense Refill  . levothyroxine (SYNTHROID, LEVOTHROID) 75 MCG tablet Take 75 mcg by mouth daily before breakfast.    . Multiple Vitamins-Minerals (MULTIVITAMIN ADULT PO) Take 1 tablet by mouth daily.     . SUMAtriptan  (IMITREX) 50 MG tablet Take 1 tablet (50 mg total) by mouth every 2 (two) hours as needed for migraine. May repeat in 2 hours if headache persists or recurs. 10 tablet 6   No facility-administered medications prior to visit.      Allergies:   Other   Social History   Social History  . Marital status: Single    Spouse name: N/A  . Number of children: 1  . Years of education: BS   Occupational History  . Application programmer    Social History Main Topics  . Smoking status: Never Smoker  . Smokeless tobacco: Never Used  . Alcohol use Yes     Comment: occasional  . Drug use: No  . Sexual activity: Yes   Other Topics Concern  . None   Social History Narrative   Lives at home with her mother and son.   Right-handed.   Occasional soda.     Family History:  The patient's  family history includes Asthma in her brother; Hypercholesterolemia in her mother; Hypertension in her father; Sarcoidosis in her mother; Sickle cell trait in her brother.   ROS:   Please see the history of present illness.    Review of Systems  Constitution: Negative.  HENT: Negative.   Eyes: Negative.   Cardiovascular: Positive for palpitations.  Respiratory: Negative.   Hematologic/Lymphatic: Negative.   Musculoskeletal: Negative.  Negative for joint pain.  Gastrointestinal: Negative.  Genitourinary: Negative.   Neurological: Negative.    All other systems reviewed and are negative.   PHYSICAL EXAM:   VS:  BP 114/62 (BP Location: Right Arm)   Pulse 67   Ht 5\' 7"  (1.702 m)   Wt 179 lb 12.8 oz (81.6 kg)   BMI 28.16 kg/m   Physical Exam  GEN: Well nourished, well developed, in no acute distress  Neck: no JVD, carotid bruits, or masses Cardiac:RRR; no murmurs, rubs, or gallops  Respiratory:  clear to auscultation bilaterally, normal work of breathing GI: soft, nontender, nondistended, + BS Ext: without cyanosis, clubbing, or edema, Good distal pulses bilaterally Neuro:  Alert and Oriented  x 3 Psych: euthymic mood, full affect  Wt Readings from Last 3 Encounters:  04/05/17 179 lb 12.8 oz (81.6 kg)  08/13/16 184 lb (83.5 kg)  12/12/15 190 lb (86.2 kg)      Studies/Labs Reviewed:   EKG:  EKG is  Not ordered today.    Recent Labs: 11/06/2016: BUN 7; Creatinine, Ser 0.71; Hemoglobin 11.7; Platelets 289; Potassium 3.6; Sodium 137   Lipid Panel No results found for: CHOL, TRIG, HDL, CHOLHDL, VLDL, LDLCALC, LDLDIRECT  Additional studies/ records that were reviewed today include:  Event monitor 08/2016 Study Highlights   Normal Sinus Rhythm with heart rate ranging from 60 to 100bpm.  Occasional PVCs and bigeminal PVCs  Occasional PACs     2-D echo 08/13/16 Study Conclusions   - Left ventricle: The cavity size was normal. Wall thickness was   normal. Systolic function was normal. The estimated ejection   fraction was in the range of 55% to 60%. Wall motion was normal;   there were no regional wall motion abnormalities. Left   ventricular diastolic function parameters were normal. - Aortic valve: There was no stenosis. - Mitral valve: There was trivial regurgitation. - Right ventricle: The cavity size was normal. Systolic function   was normal. - Tricuspid valve: Peak RV-RA gradient (S): 15 mm Hg. - Pulmonary arteries: PA peak pressure: 18 mm Hg (S). - Inferior vena cava: The vessel was normal in size. The   respirophasic diameter changes were in the normal range (>= 50%),   consistent with normal central venous pressure.   Impressions:   - PVCs noted frequently. Normal LV size with EF 55-60%. Normal   diastolic function. Normal RV size and systolic function. No   significant valvular abnormalities.       ASSESSMENT:    1. Palpitations   2. Essential hypertension   3. Hypothyroidism, unspecified type      PLAN:  In order of problems listed above:  Palpitations with documented PVCs, bigeminy and PACs some prior monitor 08/2016. They have now  resolved. Will check labs including CMET, CBC, and TSH. Metoprolol XL 25 mg when necessary. Follow-up with Dr. Radford Pax in September.  Essential hypertension patient's blood pressure is controlled with diet  Hypothyroidism we'll check TSH with increase in palpitations.    Medication Adjustments/Labs and Tests Ordered: Current medicines are reviewed at length with the patient today.  Concerns regarding medicines are outlined above.  Medication changes, Labs and Tests ordered today are listed in the Patient Instructions below. Patient Instructions  Medication Instructions:  Your physician has recommended you make the following change in your medication: Metoprolol XL 25 mg daily.   Labwork: Your physician recommends that you return for lab work today for CBC, CMET, TSH  Testing/Procedures: None Ordered   Follow-Up: Your physician recommends that you schedule a  follow-up appointment with Dr. Radford Pax when recall is ready.  Any Other Special Instructions Will Be Listed Below (If Applicable).     If you need a refill on your cardiac medications before your next appointment, please call your pharmacy.      Sumner Boast, PA-C  04/05/2017 10:26 AM    [facility] [address], [place], [place]  [address] Phone: [phone]; Fax: [phone]

## 2017-04-05 NOTE — Patient Instructions (Signed)
Medication Instructions:  Your physician has recommended you make the following change in your medication: Metoprolol XL 25 mg daily.   Labwork: Your physician recommends that you return for lab work today for CBC, CMET, TSH  Testing/Procedures: None Ordered   Follow-Up: Your physician recommends that you schedule a follow-up appointment with Dr. Radford Pax when recall is ready.  Any Other Special Instructions Will Be Listed Below (If Applicable).     If you need a refill on your cardiac medications before your next appointment, please call your pharmacy.

## 2017-04-06 ENCOUNTER — Telehealth: Payer: Self-pay | Admitting: *Deleted

## 2017-04-06 DIAGNOSIS — D72819 Decreased white blood cell count, unspecified: Secondary | ICD-10-CM

## 2017-04-06 NOTE — Telephone Encounter (Signed)
-----   Message from Imogene Burn, PA-C sent at 04/06/2017  7:53 AM EDT ----- Anemia better and TSH normal. WBC is low and has never been this low before. Recheck next week and if still low may need referral for further evaluation

## 2017-04-12 ENCOUNTER — Other Ambulatory Visit: Payer: Commercial Managed Care - PPO | Admitting: *Deleted

## 2017-04-12 DIAGNOSIS — D72819 Decreased white blood cell count, unspecified: Secondary | ICD-10-CM

## 2017-04-13 LAB — CBC
Hematocrit: 34.2 % (ref 34.0–46.6)
Hemoglobin: 11.9 g/dL (ref 11.1–15.9)
MCH: 29.8 pg (ref 26.6–33.0)
MCHC: 34.8 g/dL (ref 31.5–35.7)
MCV: 86 fL (ref 79–97)
Platelets: 327 10*3/uL (ref 150–379)
RBC: 3.99 x10E6/uL (ref 3.77–5.28)
RDW: 13 % (ref 12.3–15.4)
WBC: 3 10*3/uL — ABNORMAL LOW (ref 3.4–10.8)

## 2017-04-14 ENCOUNTER — Telehealth: Payer: Self-pay | Admitting: *Deleted

## 2017-04-14 ENCOUNTER — Telehealth: Payer: Self-pay | Admitting: Oncology

## 2017-04-14 DIAGNOSIS — D729 Disorder of white blood cells, unspecified: Secondary | ICD-10-CM

## 2017-04-14 NOTE — Telephone Encounter (Signed)
-----   Message from Imogene Burn, PA-C sent at 04/14/2017  9:48 AM EDT ----- WBC still low at 3.0. Refer to hematologist for further evaluation

## 2017-04-14 NOTE — Telephone Encounter (Signed)
Received a call from Neoma Laming from the referring office to schedule the pt a hematology appt. Appt has been scheduled for the pt to see Dr. Alen Blew on 6/6 at [facility] to have the pt arrive 30 minutes early. Voiced understanding. She will mail the pt a letter.

## 2017-04-14 NOTE — Telephone Encounter (Signed)
Pt aware of her lab results and that we will put in a referral in for Hematology.

## 2017-05-12 ENCOUNTER — Ambulatory Visit (HOSPITAL_BASED_OUTPATIENT_CLINIC_OR_DEPARTMENT_OTHER): Payer: Commercial Managed Care - PPO | Admitting: Oncology

## 2017-05-12 VITALS — BP 130/81 | HR 69 | Temp 98.8°F | Resp 20 | Ht 67.0 in | Wt 183.2 lb

## 2017-05-12 DIAGNOSIS — D72819 Decreased white blood cell count, unspecified: Secondary | ICD-10-CM | POA: Diagnosis not present

## 2017-05-12 DIAGNOSIS — D709 Neutropenia, unspecified: Secondary | ICD-10-CM

## 2017-05-12 NOTE — Progress Notes (Signed)
Reason for Referral: Leukocytopenia.   HPI: 41 year old woman currently of [place] but she is originally from [place]. She is a reasonably healthy woman with history of hypothyroidism and occasional seasonal allergy. She has been diagnosed with palpitations related to PVCs after presenting with chest pain. She was evaluated by Dr. Radford Pax and a CBC obtained on 04/05/2018 showed a white cell count of 2.9. Her hemoglobin was 11.9 with normal platelet count. A repeat CBC on 04/12/2017 showed a white cell count 3.0. Normal hemoglobin and platelet count. She is not aware of a previous problem related to her white cell count. During her pregnancy in 2016, her white cell count was within normal range and slightly elevated at 4.4. However, in 2014 her white cell count was 3.4 with the lower limit of normal was 4.0. She had a normal differential.  Clinically she is asymptomatic from this finding. She denied any constitutional symptoms of weight loss. She denied any lymphadenopathy or excessive fatigue or tiredness. She continues to work full time and attends to activities of daily living. She does not report any headaches, blurry vision, syncope or seizures. She does not report any fevers or chills or sweats. She does not report any cough, wheezing or hemoptysis. She does not report any nausea, vomiting or abdominal pain. She is not report any frequency urgency or hesitancy. She does not report any skeletal complaints. Remaining review of systems unremarkable.   Past Medical History:  Diagnosis Date  . Anemia   . Asthma   . Bradycardia 08/13/2016  . Headache   . HSV infection   . Hypothyroidism   . Iron deficiency   . Tachycardia    intermittent tachycardia  . Vaginal Pap smear, abnormal    colposcopy   . Vitamin D deficiency   :  Past Surgical History:  Procedure Laterality Date  . CESAREAN SECTION N/A 04/21/2015   Procedure: CESAREAN SECTION;  Surgeon: Everett Graff, MD;  Location: [facility];   Service: Obstetrics;  Laterality: N/A;  . removal of polyp from uterus    :   Current Outpatient Prescriptions:  .  levothyroxine (SYNTHROID, LEVOTHROID) 75 MCG tablet, Take 75 mcg by mouth daily before breakfast., Disp: , Rfl:  .  metoprolol succinate (TOPROL XL) 25 MG 24 hr tablet, Take 1 tablet (25 mg total) by mouth daily., Disp: 90 tablet, Rfl: 3 .  montelukast (SINGULAIR) 10 MG tablet, Take 10 mg by mouth at bedtime., Disp: , Rfl: 1 .  Multiple Vitamins-Minerals (MULTIVITAMIN ADULT PO), Take 1 tablet by mouth daily. , Disp: , Rfl:  .  SUMAtriptan (IMITREX) 50 MG tablet, Take 1 tablet (50 mg total) by mouth every 2 (two) hours as needed for migraine. May repeat in 2 hours if headache persists or recurs., Disp: 10 tablet, Rfl: 6:  Allergies  Allergen Reactions  . Other Swelling    cherries  :  Family History  Problem Relation Age of Onset  . Hypertension Father   . Hypercholesterolemia Mother   . Sarcoidosis Mother   . Asthma Brother   . Sickle cell trait Brother   :  Social History   Social History  . Marital status: Single    Spouse name: N/A  . Number of children: 1  . Years of education: BS   Occupational History  . Application programmer    Social History Main Topics  . Smoking status: Never Smoker  . Smokeless tobacco: Never Used  . Alcohol use Yes     Comment: occasional  .  Drug use: No  . Sexual activity: Yes   Other Topics Concern  . Not on file   Social History Narrative   Lives at home with her mother and son.   Right-handed.   Occasional soda.  :  Pertinent items are noted in HPI.  Exam: Blood pressure 130/81, pulse 69, temperature 98.8 F (37.1 C), temperature source Oral, resp. rate 20, height 5\' 7"  (1.702 m), weight 183 lb 3.2 oz (83.1 kg), SpO2 100 %, currently breastfeeding.  ECOG 0  General appearance: Well-appearing woman without distress. Throat: No oral ulcers or thrush. Neck: no adenopathy Back: negative Resp: clear to  auscultation bilaterally Chest wall: no tenderness Cardio: regular rate and rhythm, S1, S2 normal, no murmur, click, rub or gallop GI: soft, non-tender; bowel sounds normal; no masses,  no organomegaly Extremities: extremities normal, atraumatic, no cyanosis or edema Pulses: 2+ and symmetric Skin: Skin color, texture, turgor normal. No rashes or lesions  CBC    Component Value Date/Time   WBC 3.0 (L) 04/12/2017 0935   WBC 3.8 (L) 11/06/2016 1458   RBC 3.99 04/12/2017 0935   RBC 4.01 11/06/2016 1458   HGB 11.9 04/12/2017 0935   HCT 34.2 04/12/2017 0935   PLT 327 04/12/2017 0935   MCV 86 04/12/2017 0935   MCH 29.8 04/12/2017 0935   MCH 29.2 11/06/2016 1458   MCHC 34.8 04/12/2017 0935   MCHC 32.9 11/06/2016 1458   RDW 13.0 04/12/2017 0935   LYMPHSABS 0.7 08/01/2013 1305   MONOABS 0.5 08/01/2013 1305   EOSABS 0.1 08/01/2013 1305   BASOSABS 0.0 08/01/2013 1305     Assessment and Plan:   41 year old woman with the following issues:  1. Leukocytopenia with a normal differential. She has normal hemoglobin and platelet count. Her most recent CBC in May 2018 showed a white cell count 3.0 with the lower limit of normal 3.4. She had similar finding dating back to 2014.  The differential diagnosis was discussed today with the patient. This represents a normal variation rather than a true abnormality. Her white cell count fluctuated close to this range for at least last 4 years. I do not think that there is a hematological disorder such as leukemia, lymphoma, MDS or other bone marrow disease.  From management standpoint, no further investigation or hematological workup is needed.  2. Follow-up: Will be as needed. I am happy to evaluate her in the future if she develops other cytopenias or her white cell count drifts further down than her baseline which is slightly below the normal range.

## 2017-05-25 DIAGNOSIS — M545 Low back pain: Secondary | ICD-10-CM | POA: Diagnosis not present

## 2017-07-15 DIAGNOSIS — K429 Umbilical hernia without obstruction or gangrene: Secondary | ICD-10-CM | POA: Diagnosis not present

## 2017-08-23 ENCOUNTER — Encounter (INDEPENDENT_AMBULATORY_CARE_PROVIDER_SITE_OTHER): Payer: Self-pay

## 2017-08-23 ENCOUNTER — Ambulatory Visit (INDEPENDENT_AMBULATORY_CARE_PROVIDER_SITE_OTHER): Payer: Commercial Managed Care - PPO | Admitting: Cardiology

## 2017-08-23 ENCOUNTER — Encounter: Payer: Self-pay | Admitting: Cardiology

## 2017-08-23 VITALS — BP 112/72 | HR 68 | Ht 67.0 in | Wt 191.8 lb

## 2017-08-23 DIAGNOSIS — R002 Palpitations: Secondary | ICD-10-CM | POA: Diagnosis not present

## 2017-08-23 DIAGNOSIS — I493 Ventricular premature depolarization: Secondary | ICD-10-CM

## 2017-08-23 DIAGNOSIS — I10 Essential (primary) hypertension: Secondary | ICD-10-CM | POA: Diagnosis not present

## 2017-08-23 NOTE — Patient Instructions (Signed)
Medication Instructions:  Your physician recommends that you continue on your current medications as directed. Please refer to the Current Medication list given to you today.   Labwork: None Ordered   Testing/Procedures: None Ordered   Follow-Up: Your physician wants you to follow-up in: 1 year with Dr. Radford Pax.  You will receive a reminder letter in the mail two months in advance. If you don't receive a letter, please call our office to schedule the follow-up appointment.   If you need a refill on your cardiac medications before your next appointment, please call your pharmacy.   Thank you for choosing [facility]! Christen Bame, RN [phone]

## 2017-08-23 NOTE — Progress Notes (Signed)
Cardiology Office Note:    Date:  08/23/2017   ID:  Janice Flores, DOB May 17, 1976, MRN [id number]  PCP:  Donald Prose, MD  Cardiologist:  Fransico Him, MD   Referring MD: Donald Prose, MD   Chief Complaint  Patient presents with  . Follow-up    PVCs, palpitations and bradycardia    History of Present Illness:    Janice Flores is a 41 y.o. female with a hx of palpitations, PVC's and bradycardia . The arrythmias have been fairly well suppressed on Toprol PRN. 2D echo 08/2016 showed normal LVF and trivial MR and last heart monitor showed occasional PVCs and PACs.  She is here today for followup and is doing well.  She denies any chest pain or pressure, SOB, DOE, PND, orthopnea, LE edema, dizziness, palpitations or syncope. She has not felt any significant palpitations and has not had to use her PRN BB.    Past Medical History:  Diagnosis Date  . Anemia   . Asthma   . Bradycardia 08/13/2016  . Headache   . HSV infection   . Hypothyroidism   . Iron deficiency   . Tachycardia    intermittent tachycardia  . Vaginal Pap smear, abnormal    colposcopy   . Vitamin D deficiency     Past Surgical History:  Procedure Laterality Date  . CESAREAN SECTION N/A 04/21/2015   Procedure: CESAREAN SECTION;  Surgeon: Everett Graff, MD;  Location: [facility];  Service: Obstetrics;  Laterality: N/A;  . removal of polyp from uterus      Current Medications: Current Meds  Medication Sig  . levothyroxine (SYNTHROID, LEVOTHROID) 75 MCG tablet Take 75 mcg by mouth daily before breakfast.  . metoprolol succinate (TOPROL XL) 25 MG 24 hr tablet Take 1 tablet (25 mg total) by mouth daily.  . montelukast (SINGULAIR) 10 MG tablet Take 10 mg by mouth at bedtime.  . Multiple Vitamins-Minerals (MULTIVITAMIN ADULT PO) Take 1 tablet by mouth daily.   . SUMAtriptan (IMITREX) 50 MG tablet Take 1 tablet (50 mg total) by mouth every 2 (two) hours as needed for migraine. May repeat in 2 hours if headache persists or  recurs.     Allergies:   Other   Social History   Social History  . Marital status: Single    Spouse name: N/A  . Number of children: 1  . Years of education: BS   Occupational History  . Application programmer    Social History Main Topics  . Smoking status: Never Smoker  . Smokeless tobacco: Never Used  . Alcohol use Yes     Comment: occasional  . Drug use: No  . Sexual activity: Yes   Other Topics Concern  . None   Social History Narrative   Lives at home with her mother and son.   Right-handed.   Occasional soda.     Family History: The patient's family history includes Asthma in her brother; Hypercholesterolemia in her mother; Hypertension in her father; Sarcoidosis in her mother; Sickle cell trait in her brother.  ROS:   Please see the history of present illness.     All other systems reviewed and are negative.  EKGs/Labs/Other Studies Reviewed:    The following studies were reviewed today: none  EKG:  EKG is  ordered today.  The ekg ordered today demonstrates NSR at 68bpm with septal infarct and no ST changes  Recent Labs: 04/05/2017: ALT 10; BUN 9; Creatinine, Ser 0.76; Potassium 4.0; Sodium 137; TSH  1.340 04/12/2017: Hemoglobin 11.9; Platelets 327   Recent Lipid Panel No results found for: CHOL, TRIG, HDL, CHOLHDL, VLDL, LDLCALC, LDLDIRECT  Physical Exam:    VS:  BP 112/72   Pulse 68   Ht 5\' 7"  (1.702 m)   Wt 191 lb 12.8 oz (87 kg)   BMI 30.04 kg/m     Wt Readings from Last 3 Encounters:  08/23/17 191 lb 12.8 oz (87 kg)  05/12/17 183 lb 3.2 oz (83.1 kg)  04/05/17 179 lb 12.8 oz (81.6 kg)     GEN:  Well nourished, well developed in no acute distress HEENT: Normal NECK: No JVD; No carotid bruits LYMPHATICS: No lymphadenopathy CARDIAC: RRR, no murmurs, rubs, gallops RESPIRATORY:  Clear to auscultation without rales, wheezing or rhonchi  ABDOMEN: Soft, non-tender, non-distended MUSCULOSKELETAL:  No edema; No deformity  SKIN: Warm and  dry NEUROLOGIC:  Alert and oriented x 3 PSYCHIATRIC:  Normal affect   ASSESSMENT:    1. PVC's (premature ventricular contractions)   2. Palpitations   3. Essential hypertension    PLAN:    In order of problems listed above:  1.  PVCs - these are fairly asymptomatic and she has not had to take any BB PRN for some time.  2.  Palpitations - event monitor showed PACs and PVCs. 3.  HTN - BP well controlled on current meds.  She will continue on Toprol XL 25mg  daily.    Medication Adjustments/Labs and Tests Ordered: Current medicines are reviewed at length with the patient today.  Concerns regarding medicines are outlined above.  No orders of the defined types were placed in this encounter.  No orders of the defined types were placed in this encounter.   Signed, Fransico Him, MD  08/23/2017 3:47 PM    [place]

## 2017-10-20 ENCOUNTER — Other Ambulatory Visit: Payer: Self-pay | Admitting: Obstetrics and Gynecology

## 2017-10-20 DIAGNOSIS — Z1231 Encounter for screening mammogram for malignant neoplasm of breast: Secondary | ICD-10-CM

## 2017-10-26 ENCOUNTER — Other Ambulatory Visit: Payer: Self-pay | Admitting: Obstetrics and Gynecology

## 2017-10-26 ENCOUNTER — Ambulatory Visit
Admission: RE | Admit: 2017-10-26 | Discharge: 2017-10-26 | Disposition: A | Discharge status: Home / Self Care | Payer: Commercial Managed Care - PPO | Source: Ambulatory Visit | Attending: Obstetrics and Gynecology | Admitting: Obstetrics and Gynecology

## 2017-10-26 ENCOUNTER — Other Ambulatory Visit (HOSPITAL_COMMUNITY)
Admission: RE | Admit: 2017-10-26 | Discharge: 2017-10-26 | Disposition: A | Discharge status: Home / Self Care | Payer: Commercial Managed Care - PPO | Source: Ambulatory Visit | Attending: Obstetrics and Gynecology | Admitting: Obstetrics and Gynecology

## 2017-10-26 DIAGNOSIS — Z1231 Encounter for screening mammogram for malignant neoplasm of breast: Secondary | ICD-10-CM

## 2017-10-26 DIAGNOSIS — Z124 Encounter for screening for malignant neoplasm of cervix: Secondary | ICD-10-CM | POA: Diagnosis present

## 2017-10-26 DIAGNOSIS — Z01419 Encounter for gynecological examination (general) (routine) without abnormal findings: Secondary | ICD-10-CM | POA: Diagnosis not present

## 2017-11-02 LAB — CYTOLOGY - PAP: HPV: NOT DETECTED

## 2017-11-19 DIAGNOSIS — E039 Hypothyroidism, unspecified: Secondary | ICD-10-CM | POA: Diagnosis not present

## 2017-11-25 DIAGNOSIS — R42 Dizziness and giddiness: Secondary | ICD-10-CM | POA: Diagnosis not present

## 2017-12-22 ENCOUNTER — Other Ambulatory Visit: Payer: Self-pay | Admitting: Obstetrics and Gynecology

## 2017-12-22 DIAGNOSIS — N870 Mild cervical dysplasia: Secondary | ICD-10-CM | POA: Diagnosis not present

## 2017-12-22 DIAGNOSIS — Z3202 Encounter for pregnancy test, result negative: Secondary | ICD-10-CM | POA: Diagnosis not present

## 2018-01-14 DIAGNOSIS — G43009 Migraine without aura, not intractable, without status migrainosus: Secondary | ICD-10-CM | POA: Diagnosis not present

## 2018-01-14 DIAGNOSIS — Z0000 Encounter for general adult medical examination without abnormal findings: Secondary | ICD-10-CM | POA: Diagnosis not present

## 2018-01-14 DIAGNOSIS — E039 Hypothyroidism, unspecified: Secondary | ICD-10-CM | POA: Diagnosis not present

## 2018-01-14 DIAGNOSIS — J4520 Mild intermittent asthma, uncomplicated: Secondary | ICD-10-CM | POA: Diagnosis not present

## 2018-01-14 DIAGNOSIS — E6609 Other obesity due to excess calories: Secondary | ICD-10-CM | POA: Diagnosis not present

## 2018-04-22 DIAGNOSIS — M25561 Pain in right knee: Secondary | ICD-10-CM | POA: Diagnosis not present

## 2018-06-24 DIAGNOSIS — M25561 Pain in right knee: Secondary | ICD-10-CM | POA: Diagnosis not present

## 2018-06-28 DIAGNOSIS — R202 Paresthesia of skin: Secondary | ICD-10-CM | POA: Diagnosis not present

## 2018-07-06 ENCOUNTER — Other Ambulatory Visit: Payer: Self-pay | Admitting: Obstetrics and Gynecology

## 2018-07-06 ENCOUNTER — Other Ambulatory Visit (HOSPITAL_COMMUNITY)
Admission: RE | Admit: 2018-07-06 | Discharge: 2018-07-06 | Disposition: A | Discharge status: Home / Self Care | Payer: Commercial Managed Care - PPO | Source: Ambulatory Visit | Attending: Obstetrics and Gynecology | Admitting: Obstetrics and Gynecology

## 2018-07-06 DIAGNOSIS — N870 Mild cervical dysplasia: Secondary | ICD-10-CM | POA: Diagnosis not present

## 2018-07-06 DIAGNOSIS — Z01411 Encounter for gynecological examination (general) (routine) with abnormal findings: Secondary | ICD-10-CM | POA: Insufficient documentation

## 2018-07-06 DIAGNOSIS — Z01419 Encounter for gynecological examination (general) (routine) without abnormal findings: Secondary | ICD-10-CM | POA: Diagnosis not present

## 2018-07-07 DIAGNOSIS — K429 Umbilical hernia without obstruction or gangrene: Secondary | ICD-10-CM | POA: Diagnosis not present

## 2018-07-12 LAB — CYTOLOGY - PAP: HPV: NOT DETECTED

## 2018-09-07 DIAGNOSIS — J0100 Acute maxillary sinusitis, unspecified: Secondary | ICD-10-CM | POA: Diagnosis not present

## 2018-09-12 ENCOUNTER — Ambulatory Visit: Payer: Commercial Managed Care - PPO | Admitting: Cardiology

## 2018-09-12 ENCOUNTER — Encounter: Payer: Self-pay | Admitting: Cardiology

## 2018-09-12 VITALS — BP 128/66 | HR 66 | Ht 67.0 in | Wt 193.4 lb

## 2018-09-12 DIAGNOSIS — I493 Ventricular premature depolarization: Secondary | ICD-10-CM

## 2018-09-12 DIAGNOSIS — I10 Essential (primary) hypertension: Secondary | ICD-10-CM | POA: Diagnosis not present

## 2018-09-12 NOTE — Patient Instructions (Signed)

## 2018-09-12 NOTE — Progress Notes (Signed)
Cardiology Office Note:    Date:  09/12/2018   ID:  Janice Flores, DOB Jan 06, 1976, MRN [id number]  PCP:  Donald Prose, MD  Cardiologist:  No primary care provider on file.    Referring MD: Donald Prose, MD   Chief Complaint  Patient presents with  . Follow-up    PVCs, palpitations.    History of Present Illness:    Janice Flores is a 42 y.o. female with a hx of palpitations, PVC's and bradycardia . The arrythmias have been fairly well suppressed on Toprol PRN. 2D echo 08/2016 showed normal LVF and trivial MR and last heart monitor showed occasional PVCs and PACs. She is here today for followup and is doing well.  She denies any chest pain or pressure, SOB, DOE, PND, orthopnea, LE edema, dizziness, palpitations or syncope. She is compliant with her meds and is tolerating meds with no SE.    Past Medical History:  Diagnosis Date  . Anemia   . Asthma   . Bradycardia 08/13/2016  . Headache   . HSV infection   . Hypothyroidism   . Iron deficiency   . Tachycardia    intermittent tachycardia  . Vaginal Pap smear, abnormal    colposcopy   . Vitamin D deficiency     Past Surgical History:  Procedure Laterality Date  . CESAREAN SECTION N/A 04/21/2015   Procedure: CESAREAN SECTION;  Surgeon: Everett Graff, MD;  Location: [facility];  Service: Obstetrics;  Laterality: N/A;  . removal of polyp from uterus      Current Medications: Current Meds  Medication Sig  . levothyroxine (SYNTHROID, LEVOTHROID) 75 MCG tablet Take 75 mcg by mouth daily before breakfast.  . metoprolol succinate (TOPROL-XL) 25 MG 24 hr tablet Take 25 mg by mouth daily as needed.  . montelukast (SINGULAIR) 10 MG tablet Take 10 mg by mouth at bedtime.  . Multiple Vitamins-Minerals (MULTIVITAMIN ADULT PO) Take 1 tablet by mouth daily.   . SUMAtriptan (IMITREX) 50 MG tablet Take 1 tablet (50 mg total) by mouth every 2 (two) hours as needed for migraine. May repeat in 2 hours if headache persists or recurs.      Allergies:   Other   Social History   Socioeconomic History  . Marital status: Single    Spouse name: Not on file  . Number of children: 1  . Years of education: BS  . Highest education level: Not on file  Occupational History  . Occupation: Garment/textile technologist  Social Needs  . Financial resource strain: Not on file  . Food insecurity:    Worry: Not on file    Inability: Not on file  . Transportation needs:    Medical: Not on file    Non-medical: Not on file  Tobacco Use  . Smoking status: Never Smoker  . Smokeless tobacco: Never Used  Substance and Sexual Activity  . Alcohol use: Yes    Comment: occasional  . Drug use: No  . Sexual activity: Yes  Lifestyle  . Physical activity:    Days per week: Not on file    Minutes per session: Not on file  . Stress: Not on file  Relationships  . Social connections:    Talks on phone: Not on file    Gets together: Not on file    Attends religious service: Not on file    Active member of club or organization: Not on file    Attends meetings of clubs or organizations: Not on file  Relationship status: Not on file  Other Topics Concern  . Not on file  Social History Narrative   Lives at home with her mother and son.   Right-handed.   Occasional soda.     Family History: The patient's family history includes Asthma in her brother; Hypercholesterolemia in her mother; Hypertension in her father; Sarcoidosis in her mother; Sickle cell trait in her brother. There is no history of Breast cancer.  ROS:   Please see the history of present illness.    ROS  All other systems reviewed and negative.   EKGs/Labs/Other Studies Reviewed:    The following studies were reviewed today: none  EKG:  EKG is  ordered today.  The ekg ordered today demonstrates normal sinus rhythm at 66 bpm with sinus arrhythmia and septal infarct.  Recent Labs: No results found for requested labs within last 8760 hours.   Recent Lipid Panel No  results found for: CHOL, TRIG, HDL, CHOLHDL, VLDL, LDLCALC, LDLDIRECT  Physical Exam:    VS:  BP 128/66   Pulse 66   Ht 5\' 7"  (1.702 m)   Wt 193 lb 6.4 oz (87.7 kg)   BMI 30.29 kg/m     Wt Readings from Last 3 Encounters:  09/12/18 193 lb 6.4 oz (87.7 kg)  08/23/17 191 lb 12.8 oz (87 kg)  05/12/17 183 lb 3.2 oz (83.1 kg)     GEN:  Well nourished, well developed in no acute distress HEENT: Normal NECK: No JVD; No carotid bruits LYMPHATICS: No lymphadenopathy CARDIAC: RRR, no murmurs, rubs, gallops RESPIRATORY:  Clear to auscultation without rales, wheezing or rhonchi  ABDOMEN: Soft, non-tender, non-distended MUSCULOSKELETAL:  No edema; No deformity  SKIN: Warm and dry NEUROLOGIC:  Alert and oriented x 3 PSYCHIATRIC:  Normal affect   ASSESSMENT:    1. PVC's (premature ventricular contractions)   2. Essential hypertension    PLAN:    In order of problems listed above:  1.  PVCs -PVCs are well suppressed with no significant palpitations.  She will continue take Toprol trend.  2.  HTN - BP is well controlled on exam today.  She will continue on Toprol XL 25 mg daily PRN   Medication Adjustments/Labs and Tests Ordered: Current medicines are reviewed at length with the patient today.  Concerns regarding medicines are outlined above.  Orders Placed This Encounter  Procedures  . EKG 12-Lead   No orders of the defined types were placed in this encounter.   Signed, Fransico Him, MD  09/12/2018 1:40 PM    [place]

## 2018-09-15 DIAGNOSIS — E039 Hypothyroidism, unspecified: Secondary | ICD-10-CM | POA: Diagnosis not present

## 2018-09-15 DIAGNOSIS — R202 Paresthesia of skin: Secondary | ICD-10-CM | POA: Diagnosis not present

## 2018-10-01 DIAGNOSIS — H53131 Sudden visual loss, right eye: Secondary | ICD-10-CM | POA: Diagnosis not present

## 2018-10-05 ENCOUNTER — Other Ambulatory Visit: Payer: Self-pay | Admitting: Obstetrics and Gynecology

## 2018-10-05 DIAGNOSIS — Z1231 Encounter for screening mammogram for malignant neoplasm of breast: Secondary | ICD-10-CM

## 2018-10-14 DIAGNOSIS — R202 Paresthesia of skin: Secondary | ICD-10-CM | POA: Diagnosis not present

## 2018-11-16 ENCOUNTER — Ambulatory Visit
Admission: RE | Admit: 2018-11-16 | Discharge: 2018-11-16 | Disposition: A | Discharge status: Home / Self Care | Payer: Commercial Managed Care - PPO | Source: Ambulatory Visit | Attending: Obstetrics and Gynecology | Admitting: Obstetrics and Gynecology

## 2018-11-16 DIAGNOSIS — Z1231 Encounter for screening mammogram for malignant neoplasm of breast: Secondary | ICD-10-CM | POA: Diagnosis not present

## 2018-11-18 ENCOUNTER — Other Ambulatory Visit: Payer: Self-pay | Admitting: Obstetrics and Gynecology

## 2018-11-18 DIAGNOSIS — R928 Other abnormal and inconclusive findings on diagnostic imaging of breast: Secondary | ICD-10-CM

## 2018-11-22 ENCOUNTER — Ambulatory Visit
Admission: RE | Admit: 2018-11-22 | Discharge: 2018-11-22 | Disposition: A | Discharge status: Home / Self Care | Payer: Commercial Managed Care - PPO | Source: Ambulatory Visit | Attending: Obstetrics and Gynecology | Admitting: Obstetrics and Gynecology

## 2018-11-22 DIAGNOSIS — R928 Other abnormal and inconclusive findings on diagnostic imaging of breast: Secondary | ICD-10-CM

## 2018-11-22 DIAGNOSIS — R922 Inconclusive mammogram: Secondary | ICD-10-CM | POA: Diagnosis not present

## 2018-11-22 DIAGNOSIS — N6011 Diffuse cystic mastopathy of right breast: Secondary | ICD-10-CM | POA: Diagnosis not present

## 2018-11-25 DIAGNOSIS — R0600 Dyspnea, unspecified: Secondary | ICD-10-CM | POA: Diagnosis not present

## 2018-12-15 DIAGNOSIS — H04123 Dry eye syndrome of bilateral lacrimal glands: Secondary | ICD-10-CM | POA: Diagnosis not present

## 2018-12-15 DIAGNOSIS — H40013 Open angle with borderline findings, low risk, bilateral: Secondary | ICD-10-CM | POA: Diagnosis not present

## 2018-12-24 IMAGING — MG DIGITAL SCREENING BILATERAL MAMMOGRAM WITH TOMO AND CAD
8 series · 8 of 24 positions shown · non-contrast
Comparison: Previous exam(s).

CLINICAL DATA: Screening.

EXAM:
DIGITAL SCREENING BILATERAL MAMMOGRAM WITH TOMO AND CAD

[R MLO synth-2D]
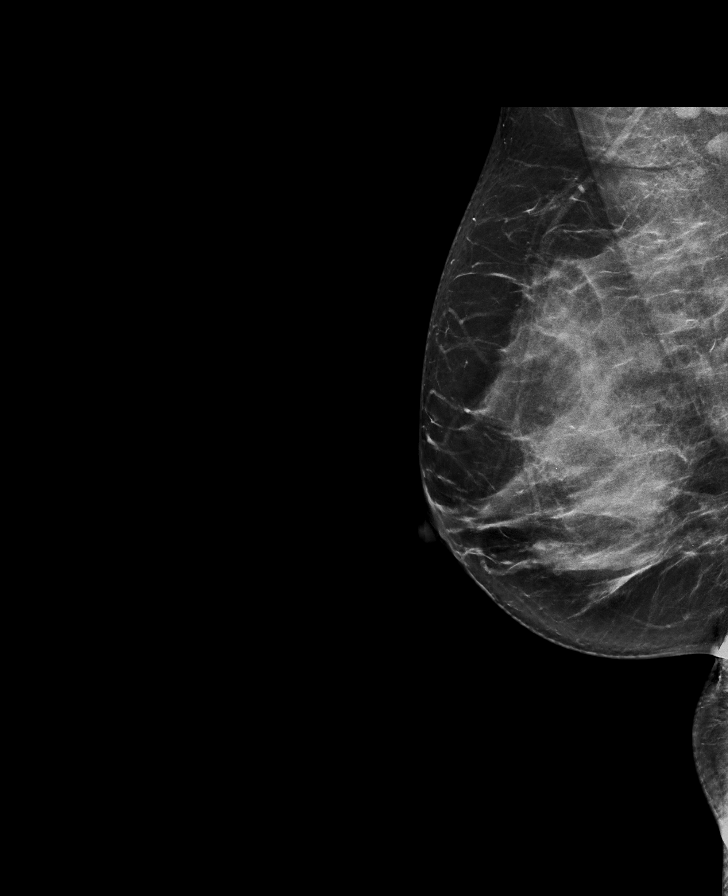

[L MLO synth-2D]
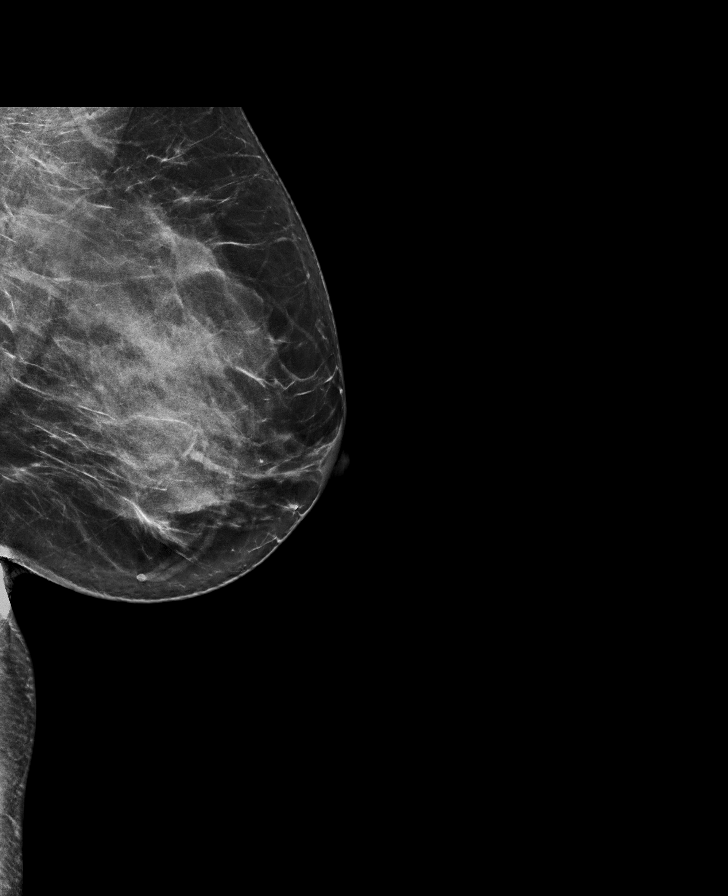

[R CC synth-2D]
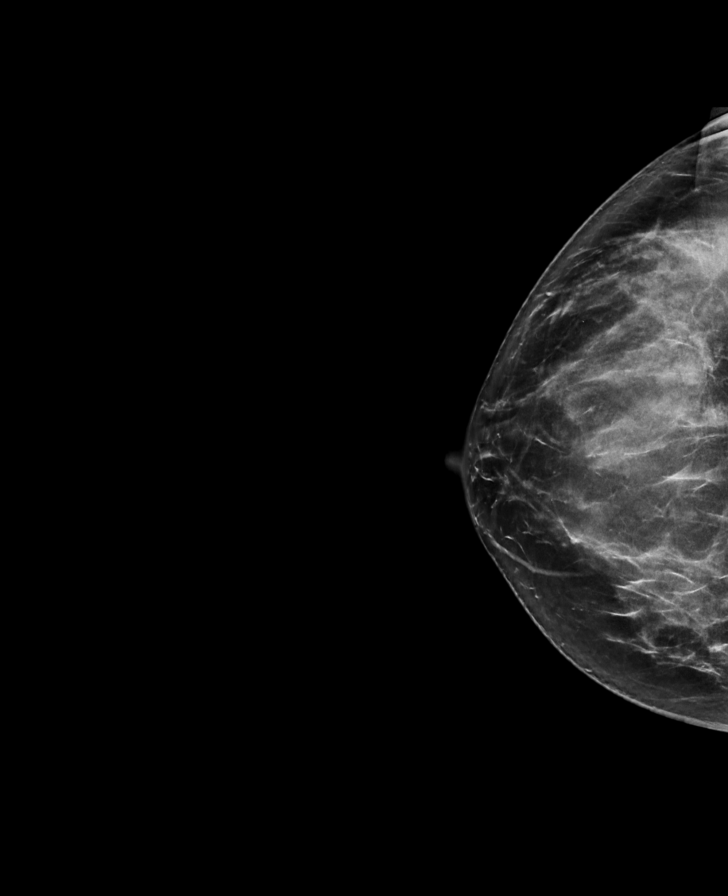

[L CC synth-2D]
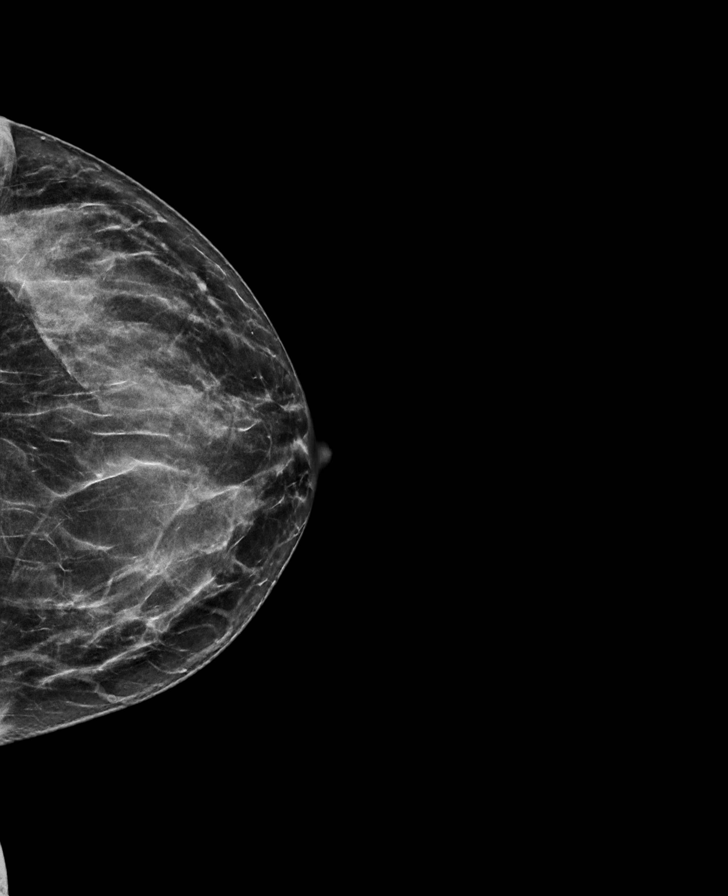

[R CC tomo · tomo slice 35/70.0]
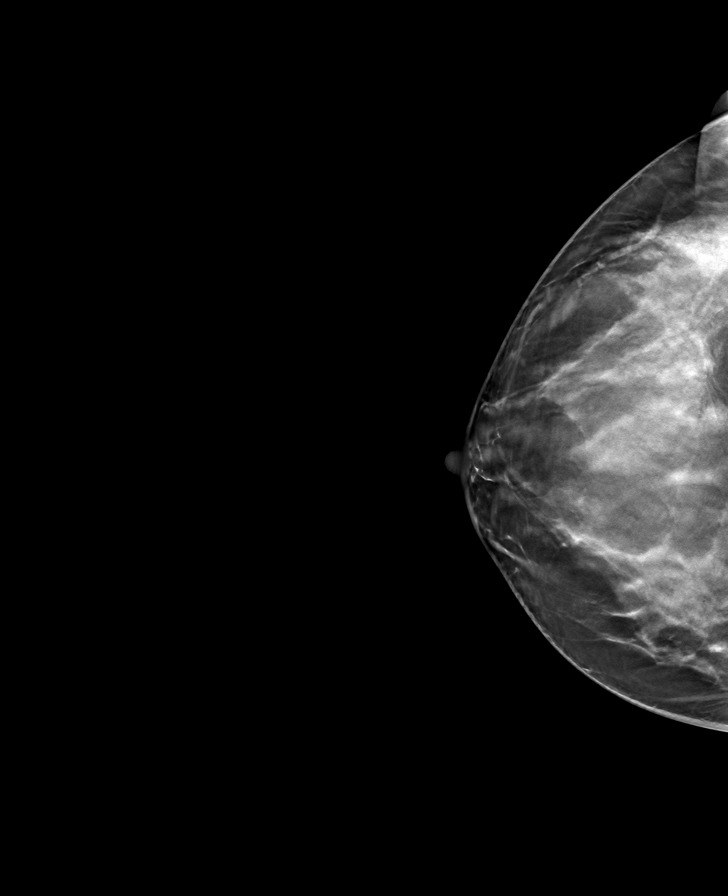

[R MLO tomo · tomo slice 39/76.0]
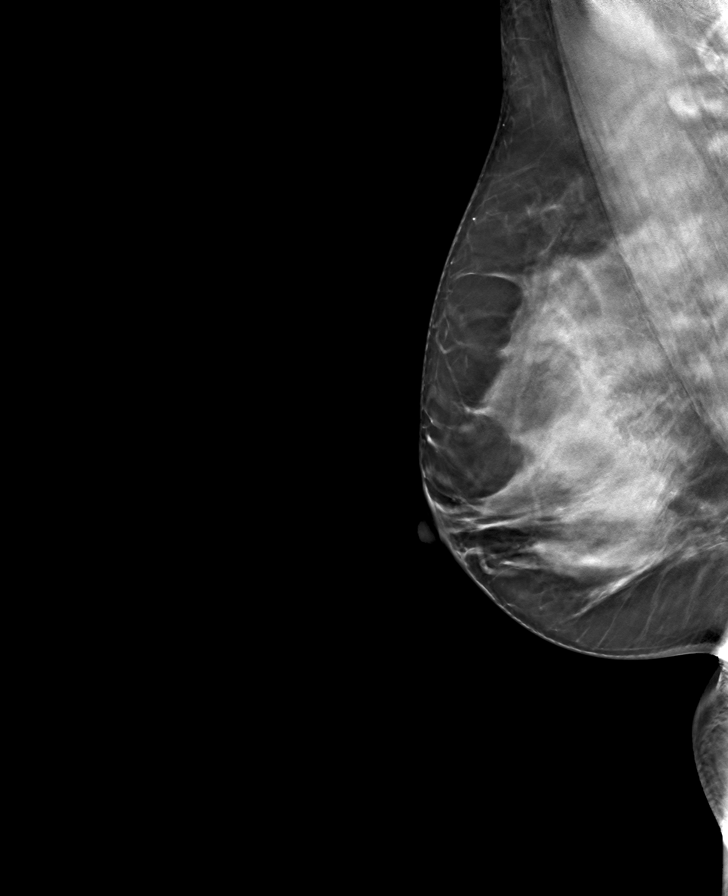

[L CC tomo · tomo slice 33/65.0]
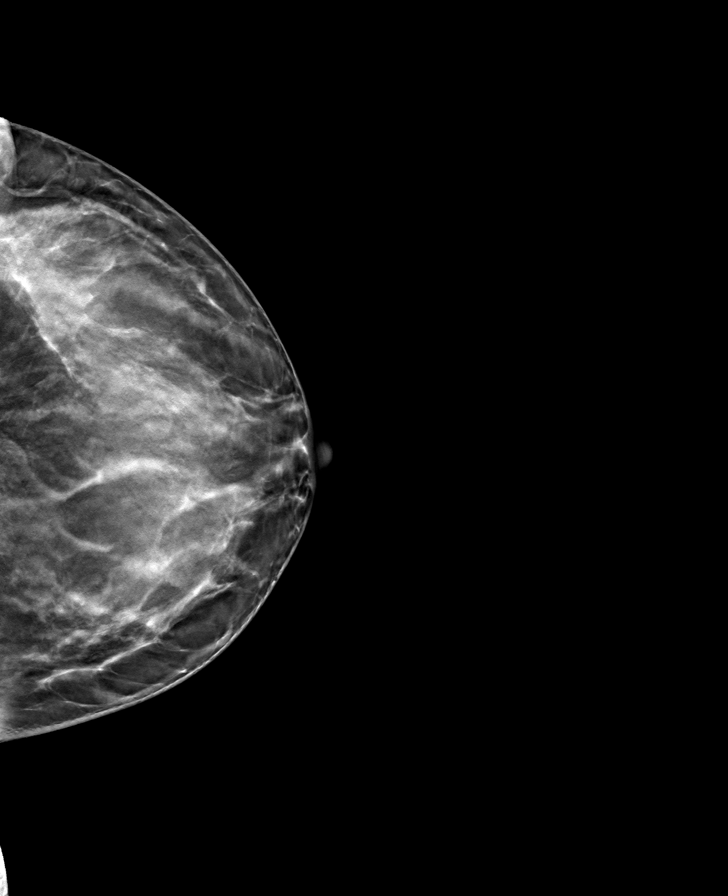

[L MLO tomo · tomo slice 38/75.0]
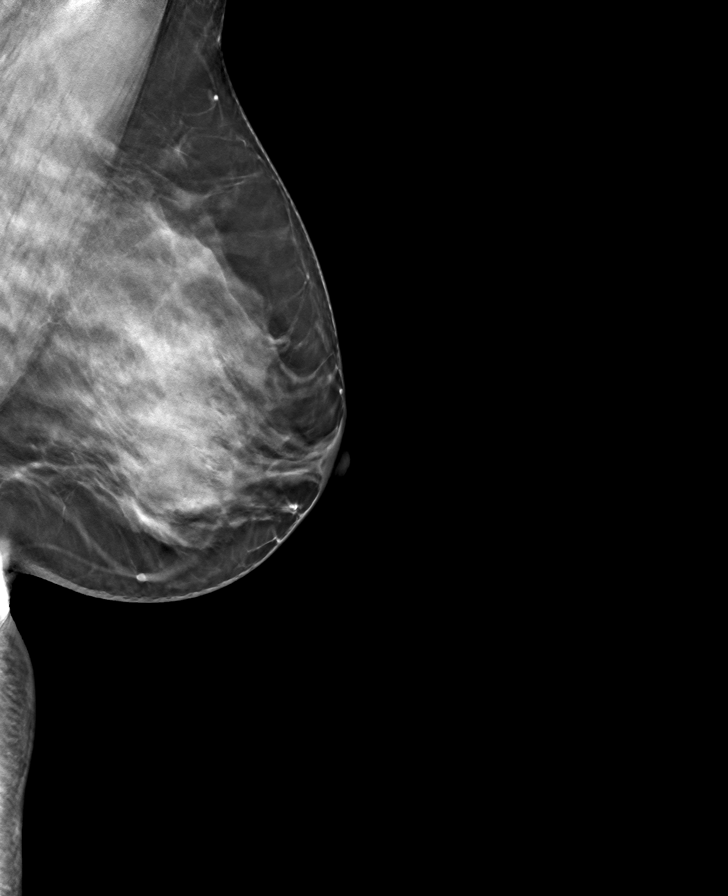

[8 of 24 positions shown; findings below may reference images not displayed]

ACR Breast Density Category d: The breast tissue is extremely dense,
which lowers the sensitivity of mammography.
FINDINGS: In the right breast, a possible asymmetry warrants further
evaluation. In the left breast, no findings suspicious for
malignancy. Images were processed with CAD.
IMPRESSION: Further evaluation is suggested for possible asymmetry in the right
breast.

RECOMMENDATION:
Diagnostic mammogram and possibly ultrasound of the right breast.
(Code:[id number]-S-WW2)

The patient will be contacted regarding the findings, and additional
imaging will be scheduled.

BI-RADS CATEGORY  0: Incomplete. Need additional imaging evaluation
and/or prior mammograms for comparison.

## 2018-12-28 DIAGNOSIS — G43009 Migraine without aura, not intractable, without status migrainosus: Secondary | ICD-10-CM | POA: Diagnosis not present

## 2019-01-03 ENCOUNTER — Other Ambulatory Visit: Payer: Self-pay | Admitting: Obstetrics and Gynecology

## 2019-01-03 ENCOUNTER — Other Ambulatory Visit (HOSPITAL_COMMUNITY)
Admission: RE | Admit: 2019-01-03 | Discharge: 2019-01-03 | Disposition: A | Discharge status: Home / Self Care | Payer: Commercial Managed Care - PPO | Source: Ambulatory Visit | Attending: Obstetrics and Gynecology | Admitting: Obstetrics and Gynecology

## 2019-01-03 DIAGNOSIS — Z01419 Encounter for gynecological examination (general) (routine) without abnormal findings: Secondary | ICD-10-CM | POA: Insufficient documentation

## 2019-01-03 DIAGNOSIS — N920 Excessive and frequent menstruation with regular cycle: Secondary | ICD-10-CM | POA: Diagnosis not present

## 2019-01-03 DIAGNOSIS — N870 Mild cervical dysplasia: Secondary | ICD-10-CM | POA: Diagnosis not present

## 2019-01-05 LAB — CYTOLOGY - PAP: HPV: NOT DETECTED

## 2019-01-11 ENCOUNTER — Encounter (HOSPITAL_BASED_OUTPATIENT_CLINIC_OR_DEPARTMENT_OTHER): Payer: Self-pay | Admitting: *Deleted

## 2019-01-11 ENCOUNTER — Emergency Department (HOSPITAL_BASED_OUTPATIENT_CLINIC_OR_DEPARTMENT_OTHER)
Admission: EM | Admit: 2019-01-11 | Discharge: 2019-01-11 | Disposition: A | Discharge status: Home / Self Care | Payer: Commercial Managed Care - PPO | Attending: Emergency Medicine | Admitting: Emergency Medicine

## 2019-01-11 ENCOUNTER — Emergency Department (HOSPITAL_BASED_OUTPATIENT_CLINIC_OR_DEPARTMENT_OTHER): Payer: Commercial Managed Care - PPO

## 2019-01-11 ENCOUNTER — Other Ambulatory Visit: Payer: Self-pay

## 2019-01-11 ENCOUNTER — Telehealth: Payer: Self-pay | Admitting: Cardiology

## 2019-01-11 DIAGNOSIS — I4891 Unspecified atrial fibrillation: Secondary | ICD-10-CM | POA: Insufficient documentation

## 2019-01-11 DIAGNOSIS — E039 Hypothyroidism, unspecified: Secondary | ICD-10-CM | POA: Insufficient documentation

## 2019-01-11 DIAGNOSIS — Z79899 Other long term (current) drug therapy: Secondary | ICD-10-CM | POA: Diagnosis not present

## 2019-01-11 DIAGNOSIS — R002 Palpitations: Secondary | ICD-10-CM | POA: Diagnosis present

## 2019-01-11 DIAGNOSIS — J45909 Unspecified asthma, uncomplicated: Secondary | ICD-10-CM | POA: Diagnosis not present

## 2019-01-11 DIAGNOSIS — R0602 Shortness of breath: Secondary | ICD-10-CM | POA: Diagnosis not present

## 2019-01-11 LAB — COMPREHENSIVE METABOLIC PANEL
ALT: 11 U/L (ref 0–44)
AST: 13 U/L — ABNORMAL LOW (ref 15–41)
Albumin: 4.3 g/dL (ref 3.5–5.0)
Alkaline Phosphatase: 34 U/L — ABNORMAL LOW (ref 38–126)
Anion gap: 6 (ref 5–15)
BUN: 12 mg/dL (ref 6–20)
CO2: 24 mmol/L (ref 22–32)
Calcium: 9.1 mg/dL (ref 8.9–10.3)
Chloride: 106 mmol/L (ref 98–111)
Creatinine, Ser: 0.82 mg/dL (ref 0.44–1.00)
GFR calc Af Amer: >60 mL/min (ref >60)
GFR calc non Af Amer: >60 mL/min (ref >60)
Glucose, Bld: 102 mg/dL — ABNORMAL HIGH (ref 70–99)
Potassium: 3.4 mmol/L — ABNORMAL LOW (ref 3.5–5.1)
Sodium: 136 mmol/L (ref 135–145)
Total Bilirubin: 1 mg/dL (ref 0.3–1.2)
Total Protein: 8.1 g/dL (ref 6.5–8.1)

## 2019-01-11 LAB — CBC WITH DIFFERENTIAL/PLATELET
Abs Immature Granulocytes: 0.01 10*3/uL (ref 0.00–0.07)
Basophils Absolute: 0 10*3/uL (ref 0.0–0.1)
Basophils Relative: 1 %
Eosinophils Absolute: 0.1 10*3/uL (ref 0.0–0.5)
Eosinophils Relative: 2 %
HCT: 40.2 % (ref 36.0–46.0)
Hemoglobin: 12.7 g/dL (ref 12.0–15.0)
Immature Granulocytes: 0 %
Lymphocytes Relative: 16 %
Lymphs Abs: 0.7 10*3/uL (ref 0.7–4.0)
MCH: 29.1 pg (ref 26.0–34.0)
MCHC: 31.6 g/dL (ref 30.0–36.0)
MCV: 92.2 fL (ref 80.0–100.0)
Monocytes Absolute: 0.5 10*3/uL (ref 0.1–1.0)
Monocytes Relative: 11 %
Neutro Abs: 3.4 10*3/uL (ref 1.7–7.7)
Neutrophils Relative %: 70 %
Platelets: 306 10*3/uL (ref 150–400)
RBC: 4.36 MIL/uL (ref 3.87–5.11)
RDW: 13.2 % (ref 11.5–15.5)
WBC: 4.8 10*3/uL (ref 4.0–10.5)
nRBC: 0 % (ref 0.0–0.2)

## 2019-01-11 LAB — D-DIMER, QUANTITATIVE: D-Dimer, Quant: 1.2 ug/mL-FEU — ABNORMAL HIGH (ref 0.00–0.50)

## 2019-01-11 LAB — TROPONIN I: Troponin I: <0.03 ng/mL (ref <0.03)

## 2019-01-11 MED ORDER — METOPROLOL TARTRATE 5 MG/5ML IV SOLN
5.0000 mg | Freq: Once | INTRAVENOUS | Status: AC
  Administered 2019-01-11: 5 mg via INTRAVENOUS
  Filled 2019-01-11: qty 5

## 2019-01-11 MED ORDER — SODIUM CHLORIDE 0.9 % IV BOLUS
1000.0000 mL | Freq: Once | INTRAVENOUS | Status: AC
  Administered 2019-01-11: 1000 mL via INTRAVENOUS

## 2019-01-11 MED ORDER — IOPAMIDOL (ISOVUE-370) INJECTION 76%
100.0000 mL | Freq: Once | INTRAVENOUS | Status: AC | PRN
  Administered 2019-01-11: 100 mL via INTRAVENOUS

## 2019-01-11 MED ORDER — METOPROLOL SUCCINATE ER 25 MG PO TB24: 25.0000 mg | ORAL_TABLET | Freq: Every day | ORAL | 0 refills | Status: DC

## 2019-01-11 MED FILL — METOPROLOL SUCCINATE ER 25: 25 | 30 days supply | Qty: 30 | Fill #0

## 2019-01-11 NOTE — Telephone Encounter (Signed)
Pt advised Dr. Theodosia Blender recommendation to see her PMD DR. Nancy Fetter today and pt agrees and is calling now to make an appt.

## 2019-01-11 NOTE — Telephone Encounter (Signed)
Please get her in to her PCP today to be evaluated

## 2019-01-11 NOTE — Telephone Encounter (Signed)
Pt called to report that she had an episode at 4am that woke her up feeling like her heart was racing and felt it in her throat.. She says it lasted about an hour.. she was a little Sob with it but denies dizziness and palpitations.  She says that now she just feels washed out and very fatigued. She says she does not know how to check her pulse but there is a nurse at her office that can check it later this morning.. I advised her to let us know if she has any other episodes and she will check her bottle of Toprol to see if it is still in date since she has not taken it for quite some time. Will call if her heart races again and advise on if she should take her beta blocker.. will also send to Dr. Radford Pax for her FYI and pt to call back if any further problems.

## 2019-01-11 NOTE — ED Notes (Signed)
Patient transported to CT 

## 2019-01-11 NOTE — Telephone Encounter (Signed)
New message   STAT if HR is under 50 or over 120 (normal HR is 60-100 beats per minute)  1) What is your heart rate? Patient states that she had a rapid heart beat when she woke up but does not have the heart rate   2) Do you have a log of your heart rate readings (document readings)? No   3) Do you have any other symptoms?no

## 2019-01-11 NOTE — ED Provider Notes (Signed)
[facility] Provider Note   CSN: [id number] Arrival date & time: 01/11/19  1048     History   Chief Complaint Chief Complaint  Patient presents with  . Chest Pain    HPI Janice Flores is a 43 y.o. female history of PVCs, hypothyroidism on Synthroid, anemia here presenting with palpitations.  She states that she woke around 2 AM with palpitations.  She states that she has some subjective shortness of breath as well.  She states that whenever she exerts herself she felt her heart rate goes up and she gets very short of breath.  She denies any recent travel or history of blood clots or calf pain or tenderness.  Patient did see Dr. Radford Pax about 3 years ago for PVCs.  That echo at that time that was unremarkable.  She states that she has family history of atrial fibrillation but no personal history of it. She was on toprol but hasn't been taking it.   The history is provided by the patient.    Past Medical History:  Diagnosis Date  . Anemia   . Asthma   . Bradycardia 08/13/2016  . Headache   . HSV infection   . Hypothyroidism   . Iron deficiency   . Tachycardia    intermittent tachycardia  . Vaginal Pap smear, abnormal    colposcopy   . Vitamin D deficiency     Patient Active Problem List   Diagnosis Date Noted  . Hypertension 04/05/2017  . Bradycardia 08/13/2016  . Migraine 11/06/2015  . Episodic headache 11/06/2015  . 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 Advanced maternal age, 1st pregnancy 04/21/2015  . Positive GBS test 04/21/2015  . Normal labor 04/21/2015  . SOB (shortness of breath) 02/22/2015  . Palpitations 02/22/2015  . PVC's (premature ventricular contractions) 09/28/2013  . Hypothyroidism   . Iron deficiency   . Anemia     Past Surgical History:  Procedure Laterality Date  . CESAREAN SECTION N/A 04/21/2015   Procedure: CESAREAN SECTION;  Surgeon: Everett Graff, MD;  Location: [facility];  Service: Obstetrics;  Laterality: N/A;  .  removal of polyp from uterus       OB History    Gravida  1   Para  1   Term  1   Preterm      AB      Living  0     SAB      TAB      Ectopic      Multiple  0   Live Births               Home Medications    Prior to Admission medications   Medication Sig Start Date End Date Taking? Authorizing Provider  levothyroxine (SYNTHROID, LEVOTHROID) 75 MCG tablet Take 75 mcg by mouth daily before breakfast.    [provider]  metoprolol succinate (TOPROL-XL) 25 MG 24 hr tablet Take 25 mg by mouth daily as needed.    [provider]  montelukast (SINGULAIR) 10 MG tablet Take 10 mg by mouth at bedtime. 03/13/17   [provider]  Multiple Vitamins-Minerals (MULTIVITAMIN ADULT PO) Take 1 tablet by mouth daily.     [provider]  SUMAtriptan (IMITREX) 50 MG tablet Take 1 tablet (50 mg total) by mouth every 2 (two) hours as needed for migraine. May repeat in 2 hours if headache persists or recurs. 11/06/15   Marcial Pacas, MD    Family History Family  History  Problem Relation Age of Onset  . Hypertension Father   . Hypercholesterolemia Mother   . Sarcoidosis Mother   . Asthma Brother   . Sickle cell trait Brother   . Breast cancer Neg Hx     Social History Social History   Tobacco Use  . Smoking status: Never Smoker  . Smokeless tobacco: Never Used  Substance Use Topics  . Alcohol use: Yes    Comment: occasional  . Drug use: No     Allergies   Other   Review of Systems Review of Systems  Respiratory: Positive for shortness of breath.   Cardiovascular: Positive for chest pain and palpitations.  All other systems reviewed and are negative.    Physical Exam Updated Vital Signs BP 106/73   Pulse 84   Temp 98.1 F (36.7 C) (Oral)   Resp 17   Ht 5\' 7"  (1.702 m)   Wt 85.7 kg   LMP 12/24/2018   SpO2 100%   BMI 29.60 kg/m   Physical Exam Vitals signs and nursing note reviewed.  Constitutional:       Appearance: She is well-developed.  HENT:     Head: Normocephalic.  Eyes:     Extraocular Movements: Extraocular movements intact.     Pupils: Pupils are equal, round, and reactive to light.  Neck:     Musculoskeletal: Normal range of motion and neck supple.  Cardiovascular:     Heart sounds: Normal heart sounds.     Comments: Irregular, tachycardic  Pulmonary:     Effort: Pulmonary effort is normal.     Breath sounds: Normal breath sounds.  Abdominal:     General: Bowel sounds are normal.     Palpations: Abdomen is soft.  Musculoskeletal: Normal range of motion.     Right lower leg: She exhibits no tenderness. No edema.     Left lower leg: She exhibits no tenderness. No edema.  Skin:    General: Skin is warm.     Capillary Refill: Capillary refill takes less than 2 seconds.  Neurological:     General: No focal deficit present.     Mental Status: She is alert.  Psychiatric:        Mood and Affect: Mood normal.        Behavior: Behavior normal.      ED Treatments / Results  Labs (all labs ordered are listed, but only abnormal results are displayed) Labs Reviewed  COMPREHENSIVE METABOLIC PANEL - Abnormal; Notable for the following components:      Result Value   Potassium 3.4 (*)    Glucose, Bld 102 (*)    AST 13 (*)    Alkaline Phosphatase 34 (*)    All other components within normal limits  D-DIMER, QUANTITATIVE (NOT AT [facility]) - Abnormal; Notable for the following components:   D-Dimer, Quant 1.20 (*)    All other components within normal limits  CBC WITH DIFFERENTIAL/PLATELET  TROPONIN I    EKG EKG Interpretation  Date/Time:  Wednesday January 11 2019 10:57:50 EST Ventricular Rate:  114 PR Interval:    QRS Duration: 91 QT Interval:  334 QTC Calculation: 460 R Axis:   -94 Text Interpretation:  Atrial fibrillation Markedly posterior QRS axis Probable anteroseptal infarct, old previous tracing showed NSR  Confirmed by Wandra Arthurs [phone]) on 01/11/2019  11:04:01 AM   Radiology Dg Chest 2 View  Result Date: 01/11/2019 CLINICAL DATA:  Cardiac palpitations shortness of breath EXAM: CHEST - 2 VIEW  COMPARISON:  November 06, 2016 FINDINGS: Lungs are clear. Heart size and pulmonary vascularity are normal. No adenopathy. There is thoracolumbar levoscoliosis. IMPRESSION: No edema or consolidation. Electronically Signed   By: Lowella Grip III M.D.   On: 01/11/2019 11:24   Ct Angio Chest Pe W And/or Wo Contrast  Result Date: 01/11/2019 CLINICAL DATA:  Racing heart.  Shortness of breath. EXAM: CT ANGIOGRAPHY CHEST WITH CONTRAST TECHNIQUE: Multidetector CT imaging of the chest was performed using the standard protocol during bolus administration of intravenous contrast. Multiplanar CT image reconstructions and MIPs were obtained to evaluate the vascular anatomy. CONTRAST:  181mL ISOVUE-370 IOPAMIDOL (ISOVUE-370) INJECTION 76% COMPARISON:  Chest x-ray January 11, 2019 FINDINGS: Cardiovascular: Satisfactory opacification of the pulmonary arteries to the segmental level. No evidence of pulmonary embolism. Normal heart size. No pericardial effusion. Mediastinum/Nodes: No enlarged mediastinal, hilar, or axillary lymph nodes. Thyroid gland, trachea, and esophagus demonstrate no significant findings. Lungs/Pleura: Lungs are clear. No pleural effusion or pneumothorax. Upper Abdomen: No acute abnormality. Musculoskeletal: No chest wall abnormality. No acute or significant osseous findings. Review of the MIP images confirms the above findings. IMPRESSION: 1. No pulmonary emboli. No cause for the patient's symptoms identified. Electronically Signed   By: Dorise Bullion III M.D   On: 01/11/2019 12:59    Procedures Procedures (including critical care time)  Medications Ordered in ED Medications  sodium chloride 0.9 % bolus 1,000 mL (1,000 mLs Intravenous New Bag/Given 01/11/19 1126)  metoprolol tartrate (LOPRESSOR) injection 5 mg (5 mg Intravenous Given 01/11/19 1124)    iopamidol (ISOVUE-370) 76 % injection 100 mL (100 mLs Intravenous Contrast Given 01/11/19 1235)     Initial Impression / Assessment and Plan / ED Course  I have reviewed the triage vital signs and the nursing notes.  Pertinent labs & imaging results that were available during my care of the patient were reviewed by me and considered in my medical decision making (see chart for details).    Janice Flores is a 43 y.o. female here with palpitations, SOB. Patient appears to be in new onset afib with RVR. Patient is not anticoagulated and her CHADVASC score is 1 so doesn't need anticoagulation currently. Will get labs, d-dimer, CXR. Will give lopressor as patient was on beta blockers before.   1:37 PM D-dimer positive but CTA showed no PE. HR down to 80s after lopressor. She was on toprol XL 25 mg daily before but hasn't been taking it. Will re prescribe it and told her to take it and follow up with Dr. Radford Pax. Her CHADVASC score is 1 so doesn't need anticoagulation.   Final Clinical Impressions(s) / ED Diagnoses   Final diagnoses:  None    ED Discharge Orders    None       Drenda Freeze, MD 01/11/19 [phone]

## 2019-01-11 NOTE — Discharge Instructions (Signed)
You have atrial fibrillation   You need to take toprol XL 25 mg daily.   Please see Dr. Radford Pax this week.   Return to ER if you have worse trouble breathing, shortness of breath, palpitations

## 2019-01-11 NOTE — Telephone Encounter (Signed)
Primary nurse for Dr. Radford Pax is not available today. Will route to Triage for further evaluation.

## 2019-01-11 NOTE — ED Triage Notes (Signed)
Pt states she awoke at 4am with feeling of her heart racing, was able to go back to sleep, but woke again at 6am and still felt as if her heart was racing, and "out of breath". ekg performed while pt being triaged, pt denies any dizzyness, nausea or diaphoresis.

## 2019-01-12 ENCOUNTER — Ambulatory Visit: Payer: Commercial Managed Care - PPO | Admitting: Cardiology

## 2019-01-12 ENCOUNTER — Encounter: Payer: Self-pay | Admitting: Cardiology

## 2019-01-12 VITALS — BP 106/66 | HR 67 | Ht 67.0 in | Wt 193.2 lb

## 2019-01-12 DIAGNOSIS — I4891 Unspecified atrial fibrillation: Secondary | ICD-10-CM | POA: Insufficient documentation

## 2019-01-12 DIAGNOSIS — I493 Ventricular premature depolarization: Secondary | ICD-10-CM | POA: Diagnosis not present

## 2019-01-12 DIAGNOSIS — I10 Essential (primary) hypertension: Secondary | ICD-10-CM | POA: Diagnosis not present

## 2019-01-12 HISTORY — DX: Unspecified atrial fibrillation: I48.91

## 2019-01-12 NOTE — Patient Instructions (Signed)
Medication Instructions:  Your physician recommends that you continue on your current medications as directed. Please refer to the Current Medication list given to you today.  If you need a refill on your cardiac medications before your next appointment, please call your pharmacy.   Lab work: Today: TSH   If you have labs (blood work) drawn today and your tests are completely normal, you will receive your results only by: Marland Kitchen MyChart Message (if you have MyChart) OR . A paper copy in the mail If you have any lab test that is abnormal or we need to change your treatment, we will call you to review the results.  Testing/Procedures: Your physician has requested that you have an echocardiogram. Echocardiography is a painless test that uses sound waves to create images of your heart. It provides your doctor with information about the size and shape of your heart and how well your heart's chambers and valves are working. This procedure takes approximately one hour. There are no restrictions for this procedure.  Follow-Up: 3 month Follow up with Dr. Radford Pax or care team.

## 2019-01-12 NOTE — Progress Notes (Signed)
Cardiology Office Note    Date:  01/12/2019   ID:  Janice Flores, [place] 06/13/1976, MRN [id number]  PCP:  Donald Prose, MD  Cardiologist:  Fransico Him, MD   Chief Complaint  Patient presents with  . Atrial Fibrillation    History of Present Illness:   This a very pleasant 43 year old female with a history of asthma, anemia, chronic headaches, bradycardia, hypothyroidism and iron deficiency anemia.  I have been following her in the past for PVCs and when I saw her last a few months ago she was doing well.  She awakened around 2 AM yesterday with palpitations and shortness of breath.  She complained that whenever she walked she would get short of breath and feel her heart rate go up.  She denied any recent travel or history of blood clots.  Work-up in the ER showed new onset A. fib with RVR.  Her ChADS2VASC score is  1 (female) and she was not anticoagulated.  D-dimer was elevated but chest CTA showed no evidence of PE.  She was given Lopressor and heart rates were in the 80s at the time of her discharge.  She was discharged home on 25 mg of Toprol daily and now she is here for follow-up.  She tells me that about 8 PM that evening she converted back to sinus rhythm.  She has not had any further palpitations since then.  Her shortness of breath completely resolved.  She does state that she has a family history of A. fib.  Her mom has had problems with A. fib for some time.  She rarely drinks any alcohol and denies any caffeine use.    Past Medical History:  Diagnosis Date  . Anemia   . Asthma   . Atrial fibrillation with rapid ventricular response ([place]) 01/12/2019  . Bradycardia 08/13/2016  . Headache   . HSV infection   . Hypothyroidism   . Iron deficiency   . Tachycardia    intermittent tachycardia  . Vaginal Pap smear, abnormal    colposcopy   . Vitamin D deficiency     Past Surgical History:  Procedure Laterality Date  . CESAREAN SECTION N/A 04/21/2015   Procedure: CESAREAN  SECTION;  Surgeon: Everett Graff, MD;  Location: [facility];  Service: Obstetrics;  Laterality: N/A;  . removal of polyp from uterus      Current Medications: Current Meds  Medication Sig  . levothyroxine (SYNTHROID, LEVOTHROID) 75 MCG tablet Take 75 mcg by mouth daily before breakfast.  . metoprolol succinate (TOPROL-XL) 25 MG 24 hr tablet Take 1 tablet (25 mg total) by mouth daily.  . montelukast (SINGULAIR) 10 MG tablet Take 10 mg by mouth at bedtime.  . Multiple Vitamins-Minerals (MULTIVITAMIN ADULT PO) Take 1 tablet by mouth daily.   . SUMAtriptan (IMITREX) 50 MG tablet Take 1 tablet (50 mg total) by mouth every 2 (two) hours as needed for migraine. May repeat in 2 hours if headache persists or recurs.    Allergies:   Other   Social History   Socioeconomic History  . Marital status: Single    Spouse name: Not on file  . Number of children: 1  . Years of education: BS  . Highest education level: Not on file  Occupational History  . Occupation: Garment/textile technologist  Social Needs  . Financial resource strain: Not on file  . Food insecurity:    Worry: Not on file    Inability: Not on file  . Transportation needs:  Medical: Not on file    Non-medical: Not on file  Tobacco Use  . Smoking status: Never Smoker  . Smokeless tobacco: Never Used  Substance and Sexual Activity  . Alcohol use: Yes    Comment: occasional  . Drug use: No  . Sexual activity: Yes  Lifestyle  . Physical activity:    Days per week: Not on file    Minutes per session: Not on file  . Stress: Not on file  Relationships  . Social connections:    Talks on phone: Not on file    Gets together: Not on file    Attends religious service: Not on file    Active member of club or organization: Not on file    Attends meetings of clubs or organizations: Not on file    Relationship status: Not on file  Other Topics Concern  . Not on file  Social History Narrative   Lives at home with her mother and son.     Right-handed.   Occasional soda.     Family History:  The patient's family history includes Asthma in her brother; Hypercholesterolemia in her mother; Hypertension in her father; Sarcoidosis in her mother; Sickle cell trait in her brother.   ROS:   Please see the history of present illness.    ROS All other systems reviewed and are negative.  No flowsheet data found.     PHYSICAL EXAM:   VS:  BP 106/66   Pulse 67   Ht 5\' 7"  (1.702 m)   Wt 193 lb 3.2 oz (87.6 kg)   LMP 12/24/2018   BMI 30.26 kg/m    GEN: Well nourished, well developed, in no acute distress  HEENT: normal  Neck: no JVD, carotid bruits, or masses Cardiac: RRR; no murmurs, rubs, or gallops,no edema.  Intact distal pulses bilaterally.  Respiratory:  clear to auscultation bilaterally, normal work of breathing GI: soft, nontender, nondistended, + BS MS: no deformity or atrophy  Skin: warm and dry, no rash Neuro:  Alert and Oriented x 3, Strength and sensation are intact Psych: euthymic mood, full affect  Wt Readings from Last 3 Encounters:  01/12/19 193 lb 3.2 oz (87.6 kg)  01/11/19 189 lb (85.7 kg)  09/12/18 193 lb 6.4 oz (87.7 kg)      Studies/Labs Reviewed:   EKG:  EKG is not ordered today.    Recent Labs: 01/11/2019: ALT 11; BUN 12; Creatinine, Ser 0.82; Hemoglobin 12.7; Platelets 306; Potassium 3.4; Sodium 136   Lipid Panel No results found for: CHOL, TRIG, HDL, CHOLHDL, VLDL, LDLCALC, LDLDIRECT  Additional studies/ records that were reviewed today include:  Hospital notes    ASSESSMENT:    1. Atrial fibrillation with rapid ventricular response ([place])   2. PVC's (premature ventricular contractions)   3. Essential hypertension      PLAN:  In order of problems listed above:  1. New onset atrial fibrillation with RVR -this was diagnosed yesterday in the ER.  She was given Lopressor with improvement in heart rate and sent out on Toprol-XL 25 mg daily.  She was not anticoagulated because of  a CHADS2VASC score of 1 (female).   I will get a 2D echocardiogram to assess LV function and left atrial size.  I will check a TSH.  Instructed her to take her Toprol-XL 25 mg every day and not just as needed.  I also encouraged her to avoid alcohol and caffeine.  I will see her back in 3 months.  2.  PVCs -these have been fairly well-controlled  3.  Hypertension -she was given this diagnosis when she was pregnant but says she has not had a high blood pressure since then and is not been on antihypertensive therapy.    Medication Adjustments/Labs and Tests Ordered: Current medicines are reviewed at length with the patient today.  Concerns regarding medicines are outlined above.  Medication changes, Labs and Tests ordered today are listed in the Patient Instructions below.  Patient Instructions  Medication Instructions:  Your physician recommends that you continue on your current medications as directed. Please refer to the Current Medication list given to you today.  If you need a refill on your cardiac medications before your next appointment, please call your pharmacy.   Lab work: Today: TSH   If you have labs (blood work) drawn today and your tests are completely normal, you will receive your results only by: Marland Kitchen MyChart Message (if you have MyChart) OR . A paper copy in the mail If you have any lab test that is abnormal or we need to change your treatment, we will call you to review the results.  Testing/Procedures: Your physician has requested that you have an echocardiogram. Echocardiography is a painless test that uses sound waves to create images of your heart. It provides your doctor with information about the size and shape of your heart and how well your heart's chambers and valves are working. This procedure takes approximately one hour. There are no restrictions for this procedure.  Follow-Up: 3 month Follow up with Dr. Radford Pax or care team.        Signed, Fransico Him,  MD  01/12/2019 [address], [place],   [address] Phone: [phone]; Fax: [phone]

## 2019-01-13 LAB — TSH: TSH: 3.17 u[IU]/mL (ref 0.450–4.500)

## 2019-01-16 DIAGNOSIS — G43009 Migraine without aura, not intractable, without status migrainosus: Secondary | ICD-10-CM | POA: Diagnosis not present

## 2019-01-16 DIAGNOSIS — Z0000 Encounter for general adult medical examination without abnormal findings: Secondary | ICD-10-CM | POA: Diagnosis not present

## 2019-01-16 DIAGNOSIS — J4520 Mild intermittent asthma, uncomplicated: Secondary | ICD-10-CM | POA: Diagnosis not present

## 2019-01-16 DIAGNOSIS — E039 Hypothyroidism, unspecified: Secondary | ICD-10-CM | POA: Diagnosis not present

## 2019-01-18 ENCOUNTER — Ambulatory Visit (HOSPITAL_COMMUNITY): Payer: Commercial Managed Care - PPO | Attending: Cardiology

## 2019-01-18 DIAGNOSIS — I4891 Unspecified atrial fibrillation: Secondary | ICD-10-CM | POA: Diagnosis not present

## 2019-02-17 ENCOUNTER — Other Ambulatory Visit: Payer: Self-pay | Admitting: *Deleted

## 2019-02-17 MED ORDER — METOPROLOL SUCCINATE ER 25 MG PO TB24: 25.0000 mg | ORAL_TABLET | Freq: Every day | ORAL | 2 refills | Status: DC

## 2019-02-17 MED ORDER — METOPROLOL SUCCINATE ER 25 MG PO TB24: 25.0000 mg | ORAL_TABLET | Freq: Every day | ORAL | 0 refills | Status: DC

## 2019-02-28 DIAGNOSIS — R202 Paresthesia of skin: Secondary | ICD-10-CM | POA: Diagnosis not present

## 2019-03-13 ENCOUNTER — Other Ambulatory Visit: Payer: Self-pay | Admitting: Obstetrics and Gynecology

## 2019-03-13 DIAGNOSIS — N888 Other specified noninflammatory disorders of cervix uteri: Secondary | ICD-10-CM | POA: Diagnosis not present

## 2019-03-13 DIAGNOSIS — N870 Mild cervical dysplasia: Secondary | ICD-10-CM | POA: Diagnosis not present

## 2019-03-13 DIAGNOSIS — Z3202 Encounter for pregnancy test, result negative: Secondary | ICD-10-CM | POA: Diagnosis not present

## 2019-03-23 DIAGNOSIS — R05 Cough: Secondary | ICD-10-CM | POA: Diagnosis not present

## 2019-04-03 ENCOUNTER — Emergency Department (HOSPITAL_BASED_OUTPATIENT_CLINIC_OR_DEPARTMENT_OTHER)
Admission: EM | Admit: 2019-04-03 | Discharge: 2019-04-03 | Disposition: A | Discharge status: Home / Self Care | Payer: Commercial Managed Care - PPO | Attending: Emergency Medicine | Admitting: Emergency Medicine

## 2019-04-03 ENCOUNTER — Encounter (HOSPITAL_BASED_OUTPATIENT_CLINIC_OR_DEPARTMENT_OTHER): Payer: Self-pay | Admitting: *Deleted

## 2019-04-03 ENCOUNTER — Other Ambulatory Visit: Payer: Self-pay

## 2019-04-03 ENCOUNTER — Telehealth: Payer: Self-pay | Admitting: Cardiology

## 2019-04-03 DIAGNOSIS — Z79899 Other long term (current) drug therapy: Secondary | ICD-10-CM | POA: Diagnosis not present

## 2019-04-03 DIAGNOSIS — J45909 Unspecified asthma, uncomplicated: Secondary | ICD-10-CM | POA: Diagnosis not present

## 2019-04-03 DIAGNOSIS — I480 Paroxysmal atrial fibrillation: Secondary | ICD-10-CM | POA: Insufficient documentation

## 2019-04-03 DIAGNOSIS — I10 Essential (primary) hypertension: Secondary | ICD-10-CM | POA: Diagnosis not present

## 2019-04-03 DIAGNOSIS — R002 Palpitations: Secondary | ICD-10-CM | POA: Diagnosis present

## 2019-04-03 DIAGNOSIS — E039 Hypothyroidism, unspecified: Secondary | ICD-10-CM | POA: Insufficient documentation

## 2019-04-03 LAB — CBC WITH DIFFERENTIAL/PLATELET
Abs Immature Granulocytes: 0.01 10*3/uL (ref 0.00–0.07)
Basophils Absolute: 0 10*3/uL (ref 0.0–0.1)
Basophils Relative: 1 %
Eosinophils Absolute: 0.2 10*3/uL (ref 0.0–0.5)
Eosinophils Relative: 5 %
HCT: 40.3 % (ref 36.0–46.0)
Hemoglobin: 12.9 g/dL (ref 12.0–15.0)
Immature Granulocytes: 0 %
Lymphocytes Relative: 23 %
Lymphs Abs: 1 10*3/uL (ref 0.7–4.0)
MCH: 29.7 pg (ref 26.0–34.0)
MCHC: 32 g/dL (ref 30.0–36.0)
MCV: 92.9 fL (ref 80.0–100.0)
Monocytes Absolute: 0.5 10*3/uL (ref 0.1–1.0)
Monocytes Relative: 13 %
Neutro Abs: 2.5 10*3/uL (ref 1.7–7.7)
Neutrophils Relative %: 58 %
Platelets: 370 10*3/uL (ref 150–400)
RBC: 4.34 MIL/uL (ref 3.87–5.11)
RDW: 13.1 % (ref 11.5–15.5)
WBC: 4.2 10*3/uL (ref 4.0–10.5)
nRBC: 0 % (ref 0.0–0.2)

## 2019-04-03 LAB — BASIC METABOLIC PANEL
Anion gap: 8 (ref 5–15)
BUN: 15 mg/dL (ref 6–20)
CO2: 24 mmol/L (ref 22–32)
Calcium: 9.1 mg/dL (ref 8.9–10.3)
Chloride: 104 mmol/L (ref 98–111)
Creatinine, Ser: 0.82 mg/dL (ref 0.44–1.00)
GFR calc Af Amer: >60 mL/min (ref >60)
GFR calc non Af Amer: >60 mL/min (ref >60)
Glucose, Bld: 87 mg/dL (ref 70–99)
Potassium: 4.2 mmol/L (ref 3.5–5.1)
Sodium: 136 mmol/L (ref 135–145)

## 2019-04-03 MED ORDER — METOPROLOL TARTRATE 5 MG/5ML IV SOLN
5.0000 mg | Freq: Once | INTRAVENOUS | Status: AC
  Administered 2019-04-03: 19:00:00 5 mg via INTRAVENOUS
  Filled 2019-04-03: qty 5

## 2019-04-03 NOTE — ED Notes (Signed)
Pt verbalizes understanding of d/c instructions and denies any further needs at this time. 

## 2019-04-03 NOTE — Telephone Encounter (Signed)
Her BP: 106/87 and HR 90

## 2019-04-03 NOTE — Discharge Instructions (Addendum)
Increase your metoprolol to 50 mg once daily.  The atrial fibrillation clinic should call you tomorrow but if you do not hear from them by lunchtime call their office.  The numbers listed above.  Return to the emergency department if you have any worsening symptoms.

## 2019-04-03 NOTE — Telephone Encounter (Signed)
Spoke with Dr.Turner, she is working to address after her current visit with a patient.

## 2019-04-03 NOTE — Telephone Encounter (Signed)
Janice Flores reports she has been monitoring her HR on her watch and is usually stays between 70s-90s.  Last night, she took her scheduled Toprol 25 mg. A couple hours later around 2300, she felt her HR jump up and she got short of breath. Her HR was in the 120s. She took an extra Toprol 12.5 mg. Around 0030 this morning, she noticed her HR was 140 bpm and she took another Toprol 12.5 mg. This calmed her down and she was able to go to sleep. This morning, she felt fine when she was sitting in bed. She got up to make the bed, and her HR shot up to the 120s and she got short of breath again.  She states it feels like when she went to the hospital for afib but the symptoms are not as bad.  She sat down and rested and her symptoms have subsided. Her HR is now in the 90s. She states as long as she sits and is calm her HR is fine, but as soon as she moves her HR shoots up and she can feel it beating in her throat.  Instructed her to continue sitting calmly. She understands she will be called with Dr. Theodosia Blender recommendations.

## 2019-04-03 NOTE — ED Triage Notes (Addendum)
Pt c/o "palpatations ' x 2 days with hx of afib

## 2019-04-03 NOTE — Telephone Encounter (Signed)
Please find out what her HR and BP are currently

## 2019-04-03 NOTE — Telephone Encounter (Signed)
Patient called stating her HR is fluctuating again while she is just sitting.  Her current HR is 123, she wants to know what she should, if she should go to the ER.

## 2019-04-03 NOTE — Telephone Encounter (Signed)
Spoke with Dr. Radford Pax the patient should go to the ED to be evaluated for antiarrhythmics. The patient expressed understanding.

## 2019-04-03 NOTE — ED Provider Notes (Signed)
[facility] EMERGENCY DEPARTMENT Provider Note   CSN: [id number] Arrival date & time: 04/03/19  1652    History   Chief Complaint Chief Complaint  Patient presents with  . Palpitations    HPI Janice Flores is a 43 y.o. female.     Patient is a 43 year old female who presents with palpitations.  She states that 9:00 last night she had onset of feeling that her heart was racing.  She states is been constant since that time.  She took 2 extra half doses of her metoprolol.  She is on metoprolol for atrial fibrillation which was diagnosed in February of this year.  She had an episode of palpitations at that time which spontaneously converted.  Since then she has been on metoprolol 25 mg daily.  She states she has not missed any doses.  She denies any caffeine intake although she has been taking some fat burner supplement that she does not know with it is in it.  She states she does not think that it has any caffeine or stimulants in it.  She denies any chest pain or shortness of breath.  No dizziness.     Past Medical History:  Diagnosis Date  . Anemia   . Asthma   . Atrial fibrillation with rapid ventricular response ([place]) 01/12/2019  . Bradycardia 08/13/2016  . Headache   . HSV infection   . Hypothyroidism   . Iron deficiency   . Tachycardia    intermittent tachycardia  . Vaginal Pap smear, abnormal    colposcopy   . Vitamin D deficiency     Patient Active Problem List   Diagnosis Date Noted  . Atrial fibrillation with rapid ventricular response ([place]) 01/12/2019  . Hypertension 04/05/2017  . Bradycardia 08/13/2016  . Migraine 11/06/2015  . Episodic headache 11/06/2015  . 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 Advanced maternal age, 1st pregnancy 04/21/2015  . Positive GBS test 04/21/2015  . Normal labor 04/21/2015  . SOB (shortness of breath) 02/22/2015  . Palpitations 02/22/2015  . PVC's (premature ventricular contractions) 09/28/2013  . Hypothyroidism   . Iron  deficiency   . Anemia     Past Surgical History:  Procedure Laterality Date  . CESAREAN SECTION N/A 04/21/2015   Procedure: CESAREAN SECTION;  Surgeon: Everett Graff, MD;  Location: [facility];  Service: Obstetrics;  Laterality: N/A;  . removal of polyp from uterus       OB History    Gravida  1   Para  1   Term  1   Preterm      AB      Living  0     SAB      TAB      Ectopic      Multiple  0   Live Births               Home Medications    Prior to Admission medications   Medication Sig Start Date End Date Taking? Authorizing Provider  levothyroxine (SYNTHROID, LEVOTHROID) 75 MCG tablet Take 75 mcg by mouth daily before breakfast.    [provider]  metoprolol succinate (TOPROL-XL) 25 MG 24 hr tablet Take 1 tablet (25 mg total) by mouth daily. 02/17/19   Sueanne Margarita, MD  montelukast (SINGULAIR) 10 MG tablet Take 10 mg by mouth at bedtime. 03/13/17   [provider]  Multiple Vitamins-Minerals (MULTIVITAMIN ADULT PO) Take 1 tablet by mouth daily.     [provider]  SUMAtriptan (IMITREX) 50 MG tablet Take 1 tablet (50 mg total) by mouth every 2 (two) hours as needed for migraine. May repeat in 2 hours if headache persists or recurs. 11/06/15   Marcial Pacas, MD    Family History Family History  Problem Relation Age of Onset  . Hypertension Father   . Hypercholesterolemia Mother   . Sarcoidosis Mother   . Asthma Brother   . Sickle cell trait Brother   . Breast cancer Neg Hx     Social History Social History   Tobacco Use  . Smoking status: Never Smoker  . Smokeless tobacco: Never Used  Substance Use Topics  . Alcohol use: Yes    Comment: occasional  . Drug use: No     Allergies   Other   Review of Systems Review of Systems  Constitutional: Negative for chills, diaphoresis, fatigue and fever.  HENT: Negative for congestion, rhinorrhea and sneezing.   Eyes: Negative.   Respiratory: Negative for cough, chest  tightness and shortness of breath.   Cardiovascular: Positive for palpitations. Negative for chest pain and leg swelling.  Gastrointestinal: Negative for abdominal pain, blood in stool, diarrhea, nausea and vomiting.  Genitourinary: Negative for difficulty urinating, flank pain, frequency and hematuria.  Musculoskeletal: Negative for arthralgias and back pain.  Skin: Negative for rash.  Neurological: Negative for dizziness, speech difficulty, weakness, numbness and headaches.     Physical Exam Updated Vital Signs BP 101/60   Pulse 71   Temp 98 F (36.7 C)   Resp 20   Ht 5\' 7"  (1.702 m)   Wt 85.7 kg   LMP 03/14/2019   SpO2 100%   BMI 29.60 kg/m   Physical Exam Constitutional:      Appearance: She is well-developed.  HENT:     Head: Normocephalic and atraumatic.  Eyes:     Pupils: Pupils are equal, round, and reactive to light.  Neck:     Musculoskeletal: Normal range of motion and neck supple.  Cardiovascular:     Rate and Rhythm: Tachycardia present. Rhythm irregular.     Heart sounds: Normal heart sounds.  Pulmonary:     Effort: Pulmonary effort is normal. No respiratory distress.     Breath sounds: Normal breath sounds. No wheezing or rales.  Chest:     Chest wall: No tenderness.  Abdominal:     General: Bowel sounds are normal.     Palpations: Abdomen is soft.     Tenderness: There is no abdominal tenderness. There is no guarding or rebound.  Musculoskeletal: Normal range of motion.  Lymphadenopathy:     Cervical: No cervical adenopathy.  Skin:    General: Skin is warm and dry.     Findings: No rash.  Neurological:     Mental Status: She is alert and oriented to person, place, and time.      ED Treatments / Results  Labs (all labs ordered are listed, but only abnormal results are displayed) Labs Reviewed  BASIC METABOLIC PANEL  CBC WITH DIFFERENTIAL/PLATELET    EKG EKG Interpretation  Date/Time:  Monday April 03 2019 19:12:53 EDT Ventricular  Rate:  71 PR Interval:  158 QRS Duration: 121 QT Interval:  376 QTC Calculation: 409 R Axis:   -93 Text Interpretation:  Atrial flutter with predominant 4:1 AV block Ventricular premature complex Nonspecific IVCD with LAD Probable anteroseptal infarct, old Confirmed by Malvin Johns [phone] on 04/03/2019 7:19:09 PM   Radiology No results found.  Procedures Procedures (including critical care  time)  Medications Ordered in ED Medications  metoprolol tartrate (LOPRESSOR) injection 5 mg (5 mg Intravenous Given 04/03/19 1855)     Initial Impression / Assessment and Plan / ED Course  I have reviewed the triage vital signs and the nursing notes.  Pertinent labs & imaging results that were available during my care of the patient were reviewed by me and considered in my medical decision making (see chart for details).        Patient is a 43 year old female who has 1 prior episode of atrial fibrillation and presents with another 1 today.  Initially she was in A. fib with RVR although her heart rate slowed down to the low 100s on its own.  She was given 1 dose of IV metoprolol and her heart rate improved to the 78M and 76H systolically although she maintained in atrial fibrillation rhythm.  She is feeling better.  Her labs are non-concerning.  She has no associated chest pain or shortness of breath.  I spoke with Dr. Domenic Polite with cardiology who recommends increasing her metoprolol to 50 mg once a day.  He will have the A. fib clinic call her tomorrow for a visit.  He does not recommend anticoagulants at this time.  She has a [place] vas score of 1.  She was discharged home in good condition.  Return precautions were given.  Final Clinical Impressions(s) / ED Diagnoses   Final diagnoses:  Paroxysmal atrial fibrillation [facility])    ED Discharge Orders    None       Malvin Johns, MD 04/03/19 1952

## 2019-04-03 NOTE — ED Notes (Signed)
Pt placed on 12 lead and put into monitor, repeated EKG

## 2019-04-03 NOTE — Telephone Encounter (Signed)
New Message   STAT if HR is under 50 or over 120 (normal HR is 60-100 beats per minute)  1) What is your heart rate? 129  2) Do you have a log of your heart rate readings (document readings)? 129, 140  3) Do you have any other symptoms? No chest pains, some sob

## 2019-04-05 ENCOUNTER — Other Ambulatory Visit (HOSPITAL_COMMUNITY): Payer: Self-pay | Admitting: *Deleted

## 2019-04-05 ENCOUNTER — Encounter (HOSPITAL_COMMUNITY): Payer: Self-pay | Admitting: Physician Assistant

## 2019-04-05 ENCOUNTER — Other Ambulatory Visit: Payer: Self-pay

## 2019-04-05 ENCOUNTER — Ambulatory Visit (HOSPITAL_COMMUNITY)
Admission: RE | Admit: 2019-04-05 | Discharge: 2019-04-05 | Disposition: A | Discharge status: Home / Self Care | Payer: Commercial Managed Care - PPO | Source: Ambulatory Visit | Attending: Physician Assistant | Admitting: Physician Assistant

## 2019-04-05 VITALS — BP 105/69 | HR 77 | Ht 67.0 in | Wt 188.0 lb

## 2019-04-05 DIAGNOSIS — I480 Paroxysmal atrial fibrillation: Secondary | ICD-10-CM | POA: Diagnosis not present

## 2019-04-05 DIAGNOSIS — I483 Typical atrial flutter: Secondary | ICD-10-CM | POA: Insufficient documentation

## 2019-04-05 MED ORDER — METOPROLOL SUCCINATE ER 25 MG PO TB24: 50.0000 mg | ORAL_TABLET | Freq: Every day | ORAL | 2 refills | Status: DC

## 2019-04-05 MED ORDER — APIXABAN 5 MG PO TABS: 5.0000 mg | ORAL_TABLET | Freq: Two times a day (BID) | ORAL | 6 refills | Status: DC

## 2019-04-05 NOTE — Progress Notes (Signed)
Electrophysiology TeleHealth Note   Due to national recommendations of social distancing due to [place] 19, Audio/video telehealth visit is felt to be most appropriate for this patient at this time.  See MyChart message from today for patient consent regarding telehealth for the Atrial Fibrillation Clinic.    Date:  04/05/2019   ID:  Janice Flores, DOB 02-04-1976, MRN [id number]  Location: home  Provider location: [address] [place], [place] [address] Evaluation Performed: New patient consult  PCP:  Donald Prose, MD  Primary Cardiologist: Dr Radford Pax   CC: Evaluation for new onset atrial fibrillation/atrial flutter.   History of Present Illness: Janice Flores is a 43 y.o. female who presents via audio/video conferencing for a telehealth visit today. The patient is referred for new consultation regarding atrial fibrillation and atrial flutter by Dr Domenic Polite and [facility]. Patient reports that in February she woke in the middle of the night with palpations and SOB. She presented to the ER and was found to be in afib with RVR. She was given metoprolol on discharge and patient reports she spontaneously converted to SR later that day. She had another episode of heart racing on 04/03/19 and again went to the ER and was found to be in atrial flutter with variable conduction. Cardioversion was not pursued at that time. Patient feels that she has been persistently out of rhythm since her ER visit with nearly constant symptoms of palpitations, periodic lightheadedness, and SOB on exertion. Her Apple Watch has shown labile HR up to 140s. She denies snoring or significant alcohol use. There were no triggers during each of these episodes that the patient could identify.   Today, she denies symptoms of chest pain, orthopnea, PND, lower extremity edema, claudication, presyncope, syncope, bleeding, or neurologic sequela. The patient is tolerating medications without difficulties  and is otherwise without complaint today. +palpitations, SOB, lightheaded.  she denies symptoms of cough, fevers, chills, or new SOB worrisome for COVID 19.     Atrial Fibrillation Risk Factors:  she does not have symptoms or diagnosis of sleep apnea. she does not have a history of rheumatic fever. she does not have a history of alcohol use. The patient does have a history of early familial atrial fibrillation or other arrhythmias. Mother has afib.  she has a BMI of Body mass index is 29.44 kg/m.Marland Kitchen Filed Weights   04/05/19 0903  Weight: 85.3 kg    Past Medical History:  Diagnosis Date  . Anemia   . Asthma   . Atrial fibrillation with rapid ventricular response ([place]) 01/12/2019  . Bradycardia 08/13/2016  . Headache   . HSV infection   . Hypothyroidism   . Iron deficiency   . Tachycardia    intermittent tachycardia  . Vaginal Pap smear, abnormal    colposcopy   . Vitamin D deficiency    Past Surgical History:  Procedure Laterality Date  . CESAREAN SECTION N/A 04/21/2015   Procedure: CESAREAN SECTION;  Surgeon: Everett Graff, MD;  Location: [facility];  Service: Obstetrics;  Laterality: N/A;  . removal of polyp from uterus       Current Outpatient Medications  Medication Sig Dispense Refill  . cetirizine (ZYRTEC) 10 MG chewable tablet Chew 10 mg by mouth daily.    Marland Kitchen levothyroxine (SYNTHROID, LEVOTHROID) 75 MCG tablet Take 75 mcg by mouth daily before breakfast.    . metoprolol succinate (TOPROL-XL) 25 MG 24 hr tablet Take 1 tablet (25 mg total) by mouth daily.  90 tablet 2  . montelukast (SINGULAIR) 10 MG tablet Take 10 mg by mouth at bedtime.  1  . Multiple Vitamins-Minerals (MULTIVITAMIN ADULT PO) Take 1 tablet by mouth daily.     . SUMAtriptan (IMITREX) 50 MG tablet Take 1 tablet (50 mg total) by mouth every 2 (two) hours as needed for migraine. May repeat in 2 hours if headache persists or recurs. 10 tablet 6   No current facility-administered medications for this encounter.      Allergies:   Other   Social History:  The patient  reports that she has never smoked. She has never used smokeless tobacco. She reports current alcohol use. She reports that she does not use drugs.   Family History:  The patient's  family history includes Asthma in her brother; Hypercholesterolemia in her mother; Hypertension in her father; Sarcoidosis in her mother; Sickle cell trait in her brother.    ROS:  Please see the history of present illness.   All other systems are personally reviewed and negative.   Exam: Well appearing, alert and conversant, regular work of breathing,  good skin color  Recent Labs: 01/11/2019: ALT 11 01/12/2019: TSH 3.170 04/03/2019: BUN 15; Creatinine, Ser 0.82; Hemoglobin 12.9; Platelets 370; Potassium 4.2; Sodium 136  personally reviewed    Other studies personally reviewed: Additional studies/ records that were reviewed today include: Epic notes, echocardiogram  Echocardiogram 01/18/19 1. The left ventricle has hyperdynamic systolic function, with an ejection fraction of >65%. The cavity size was normal. Left ventricular diastolic parameters were normal.  2. The right ventricle has normal systolic function. The cavity was normal. There is no increase in right ventricular wall thickness.  3. The mitral valve is normal in structure.  4. The tricuspid valve is normal in structure.  5. The aortic valve is normal in structure. no stenosis of the aortic valve.  6. The aortic root and ascending aorta are normal in size and structure.  7. The interatrial septum was not assessed.   ASSESSMENT AND PLAN:  1. Paroxysmal atrial fibrillation/atrial flutter New onset symptomatic atrial fibrillation and atrial flutter. She has been persistent for >48 hours now. General information about atrial arrhythmias discussed and questions answered. Normal echo noted. We discussed therapeutic options including flecainide, Multaq, amiodarone, and cardioversion. Patient  would like to try flecainide.  We also discussed anticoagulation and her risk of stroke. Per guidelines, patient should be on anticoagulation for at least 3 weeks prior to cardioversion (pharmacologic or electrical) regardless of CHADS2VASC score for episodes longer than 48 hours. General education about anticoagulation provided.  Will start Eliquis 5 mg BID. Will plan to start flecainide after appropriate anticoagulation. Increase metoprolol to 50 mg daily for better rate control. Patient agrees to contact us if she feels she converts to SR or if she has clinical decompensation.   This patients CHA2DS2-VASc Score and unadjusted Ischemic Stroke Rate (% per year) is equal to 0.6 % stroke rate/year from a score of 1  Above score calculated as 1 point each if present [CHF, HTN, DM, Vascular=MI/PAD/Aortic Plaque, Age if 65-74, or Female] Above score calculated as 2 points each if present [Age > 75, or Stroke/TIA/TE]  2. Overweight  Body mass index is 29.44 kg/m. Lifestyle modification was discussed and encouraged including regular physical activity and weight reduction.   COVID screen The patient does not have any symptoms that suggest any further testing/ screening at this time.  Social distancing reinforced today.    Follow-up with AF clinic in  2 weeks.  Current medicines are reviewed at length with the patient today.   The patient does not have concerns regarding her medicines.  The following changes were made today:  Start Eliquis, increase metoprolol.  Labs/ tests ordered today include:  No orders of the defined types were placed in this encounter.   Patient Risk:  after full review of this patients clinical status, I feel that they are at moderate risk at this time.   Today, I have spent 30 minutes with the patient with telehealth technology discussing atrial fibrillation, medications, cardioversion, anticoagulation, and COVID-19 precautions.    Gwenlyn Perking PA-C  04/05/2019 12:58 PM  Afib [facility] [address] [place], [place] [address] [phone]

## 2019-04-10 ENCOUNTER — Telehealth: Payer: Self-pay | Admitting: Cardiology

## 2019-04-10 ENCOUNTER — Telehealth (HOSPITAL_COMMUNITY): Payer: Self-pay | Admitting: *Deleted

## 2019-04-10 NOTE — Telephone Encounter (Signed)
Happy to hear she is feeling better. Yes, let's still start Eliquis 5 mg BID and continue for only 30 days.

## 2019-04-10 NOTE — Telephone Encounter (Signed)
Patient called in stating since Friday she HR according to watch have been regular and in the 50-60s. She feels "great". She did not start the Eliquis due to delay in getting from drug store and now she is wondering does she still need to start it since she feels to be back in rhythm. She saw a couple outliers of HR in the 80s but mainly 50-60s. Will defer to [facility], [place] and be back in touch with patient with recommendations.

## 2019-04-10 NOTE — Telephone Encounter (Signed)
Patient notified and verbalized understanding. 

## 2019-04-10 NOTE — Telephone Encounter (Signed)
Virtual Visit Pre-Appointment Phone Call  "(Name), I am calling you today to discuss your upcoming appointment. We are currently trying to limit exposure to the virus that causes COVID-19 by seeing patients at home rather than in the office."  1. "What is the BEST phone number to call the day of the visit?" - include this in appointment notes  2. Do you have or have access to (through a family member/friend) a smartphone with video capability that we can use for your visit?" a. If yes - list this number in appt notes as cell (if different from BEST phone #) and list the appointment type as a VIDEO visit in appointment notes b. If no - list the appointment type as a PHONE visit in appointment notes  Confirm consent - "In the setting of the current Covid19 crisis, you are scheduled for a (phone or video) visit with your provider on (date) at (time).  Just as we do with many in-office visits, in order for you to participate in this visit, we must obtain consent.  If you'd like, I can send this to your mychart (if signed up) or email for you to review.  Otherwise, I can obtain your verbal consent now.  All virtual visits are billed to your insurance company just like a normal visit would be.  By agreeing to a virtual visit, we'd like you to understand that the technology does not allow for your provider to perform an examination, and thus may limit your provider's ability to fully assess your condition. If your provider identifies any concerns that need to be evaluated in person, we will make arrangements to do so.  Finally, though the technology is pretty good, we cannot assure that it will always work on either your or our end, and in the setting of a video visit, we may have to convert it to a phone-only visit.  In either situation, we cannot ensure that we have a secure connection.  Are you willing to proceed?"   Yes.  Video/doxy.me/smartphone [phone]/verbal consent 04/10/19.  3. Advise  patient to be prepared - "Two hours prior to your appointment, go ahead and check your blood pressure, pulse, oxygen saturation, and your weight (if you have the equipment to check those) and write them all down. When your visit starts, your provider will ask you for this information. If you have an Apple Watch or Kardia device, please plan to have heart rate information ready on the day of your appointment. Please have a pen and paper handy nearby the day of the visit as well."  4. Give patient instructions for MyChart download to smartphone OR Doximity/Doxy.me as below if video visit (depending on what platform provider is using)  5. Inform patient they will receive a phone call 15 minutes prior to their appointment time (may be from unknown caller ID) so they should be prepared to answer    TELEPHONE CALL NOTE  Janice Flores has been deemed a candidate for a follow-up tele-health visit to limit community exposure during the Covid-19 pandemic. I spoke with the patient via phone to ensure availability of phone/video source, confirm preferred email & phone number, and discuss instructions and expectations.  I reminded Janice Flores to be prepared with any vital sign and/or heart rhythm information that could potentially be obtained via home monitoring, at the time of her visit. I reminded Janice Flores to expect a phone call prior to her visit.  Ivin Booty  Brien Mates 04/10/2019 12:03 PM   INSTRUCTIONS FOR DOWNLOADING THE MYCHART APP TO SMARTPHONE  - The patient must first make sure to have activated MyChart and know their login information - If Apple, go to [employer] and type in MyChart in the search bar and download the app. If Android, ask patient to go to [employer] and type in Dorris in the search bar and download the app. The app is free but as with any other app downloads, their phone may require them to verify saved payment information or Apple/Android password.    - The patient will need to then log into the app with their MyChart username and password, and select [facility] as their healthcare provider to link the account. When it is time for your visit, go to the MyChart app, find appointments, and click Begin Video Visit. Be sure to Select Allow for your device to access the Microphone and Camera for your visit. You will then be connected, and your provider will be with you shortly.  **If they have any issues connecting, or need assistance please contact MyChart service desk [phone])**  **If using a computer, in order to ensure the best quality for their visit they will need to use either of the following Internet Browsers: [employer], or Google Chrome**  IF USING DOXIMITY or DOXY.ME - The patient will receive a link just prior to their visit by text.     FULL LENGTH CONSENT FOR TELE-HEALTH VISIT   I hereby voluntarily request, consent and authorize [facility] and its employed or contracted physicians, physician assistants, nurse practitioners or other licensed health care professionals (the Practitioner), to provide me with telemedicine health care services (the Services") as deemed necessary by the treating Practitioner. I acknowledge and consent to receive the Services by the Practitioner via telemedicine. I understand that the telemedicine visit will involve communicating with the Practitioner through live audiovisual communication technology and the disclosure of certain medical information by electronic transmission. I acknowledge that I have been given the opportunity to request an in-person assessment or other available alternative prior to the telemedicine visit and am voluntarily participating in the telemedicine visit.  I understand that I have the right to withhold or withdraw my consent to the use of telemedicine in the course of my care at any time, without affecting my right to future care or treatment, and that  the Practitioner or I may terminate the telemedicine visit at any time. I understand that I have the right to inspect all information obtained and/or recorded in the course of the telemedicine visit and may receive copies of available information for a reasonable fee.  I understand that some of the potential risks of receiving the Services via telemedicine include:   Delay or interruption in medical evaluation due to technological equipment failure or disruption;  Information transmitted may not be sufficient (e.g. poor resolution of images) to allow for appropriate medical decision making by the Practitioner; and/or   In rare instances, security protocols could fail, causing a breach of personal health information.  Furthermore, I acknowledge that it is my responsibility to provide information about my medical history, conditions and care that is complete and accurate to the best of my ability. I acknowledge that Practitioner's advice, recommendations, and/or decision may be based on factors not within their control, such as incomplete or inaccurate data provided by me or distortions of diagnostic images or specimens that may result from electronic transmissions. I understand that the  practice of medicine is not an Chief Strategy Officer and that Practitioner makes no warranties or guarantees regarding treatment outcomes. I acknowledge that I will receive a copy of this consent concurrently upon execution via email to the email address I last provided but may also request a printed copy by calling the office of [facility].    I understand that my insurance will be billed for this visit.   I have read or had this consent read to me.  I understand the contents of this consent, which adequately explains the benefits and risks of the Services being provided via telemedicine.   I have been provided ample opportunity to ask questions regarding this consent and the Services and have had my questions answered to  my satisfaction.  I give my informed consent for the services to be provided through the use of telemedicine in my medical care  By participating in this telemedicine visit I agree to the above.

## 2019-04-11 ENCOUNTER — Other Ambulatory Visit: Payer: Self-pay

## 2019-04-11 ENCOUNTER — Telehealth (INDEPENDENT_AMBULATORY_CARE_PROVIDER_SITE_OTHER): Payer: Commercial Managed Care - PPO | Admitting: Cardiology

## 2019-04-11 ENCOUNTER — Encounter: Payer: Self-pay | Admitting: Cardiology

## 2019-04-11 VITALS — BP 119/72 | HR 64 | Ht 67.0 in | Wt 189.0 lb

## 2019-04-11 DIAGNOSIS — I10 Essential (primary) hypertension: Secondary | ICD-10-CM

## 2019-04-11 DIAGNOSIS — R079 Chest pain, unspecified: Secondary | ICD-10-CM

## 2019-04-11 DIAGNOSIS — R0789 Other chest pain: Secondary | ICD-10-CM

## 2019-04-11 DIAGNOSIS — I480 Paroxysmal atrial fibrillation: Secondary | ICD-10-CM

## 2019-04-11 MED ORDER — METOPROLOL TARTRATE 100 MG PO TABS: ORAL_TABLET | ORAL | 0 refills | Status: DC

## 2019-04-11 NOTE — Progress Notes (Signed)
Virtual Visit via Video Note   This visit type was conducted due to national recommendations for restrictions regarding the COVID-19 Pandemic (e.g. social distancing) in an effort to limit this patient's exposure and mitigate transmission in our community.  Due to her co-morbid illnesses, this patient is at least at moderate risk for complications without adequate follow up.  This format is felt to be most appropriate for this patient at this time.  All issues noted in this document were discussed and addressed.  A limited physical exam was performed with this format.  Please refer to the patient's chart for her consent to telehealth for [facility].  Evaluation Performed:  Follow-up visit  This visit type was conducted due to national recommendations for restrictions regarding the COVID-19 Pandemic (e.g. social distancing).  This format is felt to be most appropriate for this patient at this time.  All issues noted in this document were discussed and addressed.  No physical exam was performed (except for noted visual exam findings with Video Visits).  Please refer to the patient's chart (MyChart message for video visits and phone note for telephone visits) for the patient's consent to telehealth for [facility].  Date:  04/11/2019   ID:  Janice Flores, DOB 06/13/1976, MRN [id number]  Patient Location:  Home  Provider location:   [place]  PCP:  Donald Prose, MD  Cardiologist:  Fransico Him, MD  Electrophysiologist:  None   Chief Complaint:  Atrial fibrillation  History of Present Illness:    Janice Flores is a 43 y.o. female who presents via audio/video conferencing for a telehealth visit today.    This a very pleasant 43 year old female with a history of asthma, anemia, chronic headaches, bradycardia, hypothyroidism and iron deficiency anemia.   She has a history of PVCs and recently was diagnosed with new onset atrial fibrillation with RVR.  Her ChADS2VASC score  is  1 (female) and she was not anticoagulated.  D-dimer was elevated but chest CTA showed no evidence of PE.    She converted on beta-blocker.  When I last saw her in February she was encouraged not to drink any caffeine and avoid alcohol.  2D echo was done and was normal with EF greater than 65% and normal left atrial size.  She called in on 04/03/2019 stating that her heart rate was fluctuating while she was sitting and will go as high as 123 bpm.  She was seen in A. fib clinic on 04/05/2019 and was still complaining of palpitations.  Antiarrhythmic drug therapy was discussed with the patient and she wanted to pursue flecainide therapy.  Since she had been in atrial fibrillation more than 48 hours she was started on Eliquis 5 mg twice daily.  Plan was to start flecainide when she had been on Eliquis for 3 weeks without any missed doses.  Her Toprol-XL was increased to 50 mg daily for better rate control.  She is here today for followup and is doing well.  She denies any chest pain or pressure, SOB, DOE, PND, orthopnea, LE edema or syncope.  She says that over the weekend she had some dizziness on Sunday but her heart rate is now improved and is down below 100.  She actually called the A. fib clinic to see if she still needed to start the Eliquis and they told her they would like her to continue with the plan they had established.  She has not yet gone to get it filled yet.  She  is compliant with her meds and is tolerating meds with no SE.    The patient does not have symptoms concerning for COVID-19 infection (fever, chills, cough, or new shortness of breath).    Prior CV studies:   The following studies were reviewed today:  2D echo  Past Medical History:  Diagnosis Date  . Anemia   . Asthma   . Atrial fibrillation with rapid ventricular response ([facility]) 01/12/2019  . Bradycardia 08/13/2016  . Headache   . HSV infection   . Hypothyroidism   . Iron deficiency   . Tachycardia    intermittent  tachycardia  . Vaginal Pap smear, abnormal    colposcopy   . Vitamin D deficiency    Past Surgical History:  Procedure Laterality Date  . CESAREAN SECTION N/A 04/21/2015   Procedure: CESAREAN SECTION;  Surgeon: Everett Graff, MD;  Location: [facility];  Service: Obstetrics;  Laterality: N/A;  . removal of polyp from uterus       Current Meds  Medication Sig  . cetirizine (ZYRTEC) 10 MG chewable tablet Chew 10 mg by mouth daily.  Marland Kitchen levothyroxine (SYNTHROID, LEVOTHROID) 75 MCG tablet Take 75 mcg by mouth daily before breakfast.  . metoprolol succinate (TOPROL-XL) 25 MG 24 hr tablet Take 2 tablets (50 mg total) by mouth daily.  . Multiple Vitamins-Minerals (MULTIVITAMIN ADULT PO) Take 1 tablet by mouth daily.   . SUMAtriptan (IMITREX) 50 MG tablet Take 1 tablet (50 mg total) by mouth every 2 (two) hours as needed for migraine. May repeat in 2 hours if headache persists or recurs.     Allergies:   Other   Social History   Tobacco Use  . Smoking status: Never Smoker  . Smokeless tobacco: Never Used  Substance Use Topics  . Alcohol use: Yes    Comment: occasional  . Drug use: No     Family Hx: The patient's family history includes Asthma in her brother; Hypercholesterolemia in her mother; Hypertension in her father; Sarcoidosis in her mother; Sickle cell trait in her brother. There is no history of Breast cancer.  ROS:   Please see the history of present illness.     All other systems reviewed and are negative.   Labs/Other Tests and Data Reviewed:    Recent Labs: 01/11/2019: ALT 11 01/12/2019: TSH 3.170 04/03/2019: BUN 15; Creatinine, Ser 0.82; Hemoglobin 12.9; Platelets 370; Potassium 4.2; Sodium 136   Recent Lipid Panel No results found for: CHOL, TRIG, HDL, CHOLHDL, LDLCALC, LDLDIRECT  Wt Readings from Last 3 Encounters:  04/11/19 189 lb (85.7 kg)  04/05/19 188 lb (85.3 kg)  04/03/19 189 lb (85.7 kg)     Objective:    Vital Signs:  BP 119/72   Pulse 64   Ht 5\' 7"   (1.702 m)   Wt 189 lb (85.7 kg)   LMP 03/14/2019   BMI 29.60 kg/m    CONSTITUTIONAL:  Well nourished, well developed female in no acute distress.  EYES: anicteric MOUTH: oral mucosa is pink RESPIRATORY: Normal respiratory effort, symmetric expansion CARDIOVASCULAR: No peripheral edema SKIN: No rash, lesions or ulcers MUSCULOSKELETAL: no digital cyanosis NEURO: Cranial Nerves II-XII grossly intact, moves all extremities PSYCH: Intact judgement and insight.  A&O x 3, Mood/affect appropriate   ASSESSMENT & PLAN:    1.  Paroxysmal atrial fibrillation -Her TSH was normal at 3.17.  She she says that since increasing her Toprol to 50 mg daily her heart rate is better controlled.  She was thinking that because  her heart rate had decreased that she is probably in normal sinus rhythm.  I explained to her that we cannot be 100% sure of this without her coming in to get an EKG.  Since she appears to be going in and out of A. fib at this point I would feel safer about going on Eliquis 5 mg twice daily  and then waiting 3 weeks and starting flecainide as outlined by the A. fib clinic.  In the interim I will set her up to get a coronary CTA with morphology and calcium score to make sure she does not have underlying CAD.  She will continue on Toprol-XL 50 mg daily and start the Eliquis.  She will follow back up in A. fib clinic in 3 to 4 weeks and then I will see her back in 6 weeks.  2.  Hypertension -her BP is well controlled on exam today.  She will continue on Toprol-XL 50 mg daily  3.  COVID-19 Education:The signs and symptoms of COVID-19 were discussed with the patient and how to seek care for testing (follow up with PCP or arrange E-visit).  The importance of social distancing was discussed today.  Patient Risk:   After full review of this patient's clinical status, I feel that they are at least moderate risk at this time.  Time:   Today, I have spent 15 minutes directly with the patient on video  discussing medical problems including atrial fibrillation.  We also reviewed the symptoms of COVID 19 and the ways to protect against contracting the virus with telehealth technology.  I spent an additional 5 minutes reviewing patient's chart including afib clinic notes and 2D echo.  Medication Adjustments/Labs and Tests Ordered: Current medicines are reviewed at length with the patient today.  Concerns regarding medicines are outlined above.  Tests Ordered: No orders of the defined types were placed in this encounter.  Medication Changes: No orders of the defined types were placed in this encounter.   Disposition:  Follow up in 6 week(s)  Signed, Fransico Him, MD  04/11/2019 11:06 AM    [place]

## 2019-04-11 NOTE — Patient Instructions (Signed)
Medication Instructions:  Your physician recommends that you continue on your current medications as directed. Please refer to the Current Medication list given to you today.  If you need a refill on your cardiac medications before your next appointment, please call your pharmacy.   Lab work: BMET: 1 week before your Cardiac CT.  If you have labs (blood work) drawn today and your tests are completely normal, you will receive your results only by: Marland Kitchen MyChart Message (if you have MyChart) OR . A paper copy in the mail If you have any lab test that is abnormal or we need to change your treatment, we will call you to review the results.  Testing/Procedures: Your physician has requested that you have cardiac CT. Cardiac computed tomography (CT) is a painless test that uses an x-ray machine to take clear, detailed pictures of your heart. For further information please visit [email or web address][employer]tn. Please follow instruction sheet as given.  Follow-Up: 6 week follow up with Dr. Radford Pax  Any Other Special Instructions Will Be Listed Below (If Applicable). Please arrive at the [facility] main entrance of [facility] at xx:xx AM (30-45 minutes prior to test start time)  [facility] [place], [place] [address] [phone]  Proceed to the [facility] Radiology Department (First Floor).  Please follow these instructions carefully (unless otherwise directed):  Hold all erectile dysfunction medications at least 48 hours prior to test.  On the Night Before the Test: . Be sure to Drink plenty of water. . Do not consume any caffeinated/decaffeinated beverages or chocolate 12 hours prior to your test. . Do not take any antihistamines 12 hours prior to your test.  On the Day of the Test: . Drink plenty of water. Do not drink any water within one hour of the test. . Do not eat any food 4 hours prior to the test. . You may take your regular medications prior to the  test.  . Take metoprolol (Lopressor) 100 mg, two hours prior to test. Do not take your Toprol the morning of the test.   After the Test: . Drink plenty of water. . After receiving IV contrast, you may experience a mild flushed feeling. This is normal. . On occasion, you may experience a mild rash up to 24 hours after the test. This is not dangerous. If this occurs, you can take Benadryl 25 mg and increase your fluid intake.

## 2019-04-19 ENCOUNTER — Other Ambulatory Visit: Payer: Self-pay

## 2019-04-19 ENCOUNTER — Ambulatory Visit (HOSPITAL_COMMUNITY)
Admission: RE | Admit: 2019-04-19 | Discharge: 2019-04-19 | Disposition: A | Discharge status: Home / Self Care | Payer: Commercial Managed Care - PPO | Source: Ambulatory Visit | Attending: Physician Assistant | Admitting: Physician Assistant

## 2019-04-19 DIAGNOSIS — I480 Paroxysmal atrial fibrillation: Secondary | ICD-10-CM

## 2019-04-19 NOTE — Progress Notes (Signed)
Electrophysiology TeleHealth Note   Due to national recommendations of social distancing due to [place] 19, Audio/video telehealth visit is felt to be most appropriate for this patient at this time.  See MyChart message for patient consent regarding telehealth for the Atrial Fibrillation Clinic. Consent obtained verbally.   Date:  04/19/2019   ID:  Janice Flores, DOB 10-04-1976, MRN [id number]  Location: home  Provider location: [address] [place], [place] [address] Evaluation Performed: Follow up  PCP:  Donald Prose, MD  Primary Cardiologist: Dr Radford Pax   CC: Follow up for atrial fibrillation.    History of Present Illness: Janice Flores is a 43 y.o. female who presents via audio/video conferencing for a telehealth visit today. The patient is referred for new consultation regarding atrial fibrillation and atrial flutter by Dr Domenic Polite and [facility]. Patient reports that in February she woke in the middle of the night with palpations and SOB. She presented to the ER and was found to be in afib with RVR. She was given metoprolol on discharge and patient reports she spontaneously converted to SR later that day. She had another episode of heart racing on 04/03/19 and again went to the ER and was found to be in atrial flutter with variable conduction. Cardioversion was not pursued at that time. Patient feels that she has been persistently out of rhythm since her ER visit with nearly constant symptoms of palpitations, periodic lightheadedness, and SOB on exertion. Her Apple Watch has shown labile HR up to 140s. She denies snoring or significant alcohol use. There were no triggers during each of these episodes that the patient could identify.   On follow up today, patient reports that she feels that she is going in and out of rhythm. She has an uneasy feeling in her chest which lasts for 5-10 minutes at a time and occurs several times per week. This is similar to her  symptoms when she was in afib at the ER. Overall, she feels much better higher dose of BB with no SOB or lightheadedness.  Today, she denies symptoms of chest pain, orthopnea, PND, shortness of breath, lower extremity edema, claudication, presyncope, syncope, bleeding, or neurologic sequela. The patient is tolerating medications without difficulties and is otherwise without complaint today. +palpitations.  she denies symptoms of cough, fevers, chills, or new SOB worrisome for COVID 19.     Atrial Fibrillation Risk Factors:  she does not have symptoms or diagnosis of sleep apnea. she does not have a history of rheumatic fever. she does not have a history of alcohol use. The patient does have a history of early familial atrial fibrillation or other arrhythmias. Mother has afib.  she has a BMI of There is no height or weight on file to calculate BMI.. There were no vitals filed for this visit.  Past Medical History:  Diagnosis Date   Anemia    Asthma    Atrial fibrillation with rapid ventricular response ([facility]) 01/12/2019   Bradycardia 08/13/2016   Headache    HSV infection    Hypothyroidism    Iron deficiency    Tachycardia    intermittent tachycardia   Vaginal Pap smear, abnormal    colposcopy    Vitamin D deficiency    Past Surgical History:  Procedure Laterality Date   CESAREAN SECTION N/A 04/21/2015   Procedure: CESAREAN SECTION;  Surgeon: Everett Graff, MD;  Location: [facility];  Service: Obstetrics;  Laterality: N/A;   removal of polyp  from uterus       Current Outpatient Medications  Medication Sig Dispense Refill   apixaban (ELIQUIS) 5 MG TABS tablet Take 1 tablet (5 mg total) by mouth 2 (two) times daily. (Patient not taking: Reported on 04/11/2019) 60 tablet 6   cetirizine (ZYRTEC) 10 MG chewable tablet Chew 10 mg by mouth daily.     levothyroxine (SYNTHROID, LEVOTHROID) 75 MCG tablet Take 75 mcg by mouth daily before breakfast.     metoprolol succinate  (TOPROL-XL) 25 MG 24 hr tablet Take 2 tablets (50 mg total) by mouth daily.  2   metoprolol tartrate (LOPRESSOR) 100 MG tablet Take 100 mg, 1 tablet, by mouth, 2 hours before your Cardiac CT. 1 tablet 0   Multiple Vitamins-Minerals (MULTIVITAMIN ADULT PO) Take 1 tablet by mouth daily.      SUMAtriptan (IMITREX) 50 MG tablet Take 1 tablet (50 mg total) by mouth every 2 (two) hours as needed for migraine. May repeat in 2 hours if headache persists or recurs. 10 tablet 6   No current facility-administered medications for this encounter.     Allergies:   Other   Social History:  The patient  reports that she has never smoked. She has never used smokeless tobacco. She reports current alcohol use. She reports that she does not use drugs.   Family History:  The patient's  family history includes Asthma in her brother; Hypercholesterolemia in her mother; Hypertension in her father; Sarcoidosis in her mother; Sickle cell trait in her brother.    ROS:  Please see the history of present illness.   All other systems are personally reviewed and negative.   Exam: Well appearing, alert and conversant, regular work of breathing, good skin color.   Recent Labs: 01/11/2019: ALT 11 01/12/2019: TSH 3.170 04/03/2019: BUN 15; Creatinine, Ser 0.82; Hemoglobin 12.9; Platelets 370; Potassium 4.2; Sodium 136  personally reviewed    Other studies personally reviewed: Additional studies/ records that were reviewed today include: Epic notes, echocardiogram  Echocardiogram 01/18/19 1. The left ventricle has hyperdynamic systolic function, with an ejection fraction of >65%. The cavity size was normal. Left ventricular diastolic parameters were normal.  2. The right ventricle has normal systolic function. The cavity was normal. There is no increase in right ventricular wall thickness.  3. The mitral valve is normal in structure.  4. The tricuspid valve is normal in structure.  5. The aortic valve is normal in  structure. no stenosis of the aortic valve.  6. The aortic root and ascending aorta are normal in size and structure.  7. The interatrial septum was not assessed.   ASSESSMENT AND PLAN:  1. Paroxysmal atrial fibrillation/atrial flutter Patient appears to be having paroxysms of afib although symptomatically improved on higher dose of Toprol. Plan to start flecainide after coronary CTA per Dr Radford Pax. Continue Eliquis for 4 weeks (started 04/12/19). Continue metoprolol 50 mg daily  This patients CHA2DS2-VASc Score and unadjusted Ischemic Stroke Rate (% per year) is equal to 0.6 % stroke rate/year from a score of 1  Above score calculated as 1 point each if present [CHF, HTN, DM, Vascular=MI/PAD/Aortic Plaque, Age if 65-74, or Female] Above score calculated as 2 points each if present [Age > 75, or Stroke/TIA/TE]  2. Overweight  Lifestyle modification was discussed and encouraged including regular physical activity and weight reduction.   COVID screen The patient does not have any symptoms that suggest any further testing/ screening at this time.  Social distancing reinforced today.  Follow-up with AF clinic in one month. Dr Radford Pax as scheduled.   Current medicines are reviewed at length with the patient today.   The patient does not have concerns regarding her medicines.  The following changes were made today: none  Labs/ tests ordered today include:  No orders of the defined types were placed in this encounter.   Patient Risk:  after full review of this patients clinical status, I feel that they are at moderate risk at this time.   Today, I have spent 18 minutes with the patient with telehealth technology discussing atrial fibrillation, medications, cardioversion, anticoagulation, and COVID-19 precautions.    Gwenlyn Perking PA-C 04/19/2019 9:53 AM  Afib [facility] [address] [place], [place] [address] [phone]

## 2019-04-28 ENCOUNTER — Ambulatory Visit: Payer: Commercial Managed Care - PPO | Admitting: Cardiology

## 2019-05-08 ENCOUNTER — Telehealth: Payer: Self-pay | Admitting: Cardiology

## 2019-05-08 NOTE — Telephone Encounter (Signed)
New Message              Patient is calling checking status on her CT that needs to be scheduled. Pls call to advise. This is for [employer].

## 2019-05-09 ENCOUNTER — Ambulatory Visit (HOSPITAL_COMMUNITY): Payer: Commercial Managed Care - PPO

## 2019-05-09 ENCOUNTER — Telehealth (HOSPITAL_COMMUNITY): Payer: Self-pay | Admitting: Emergency Medicine

## 2019-05-09 NOTE — Telephone Encounter (Signed)
Reaching out to patient to offer assistance regarding upcoming cardiac imaging study; pt verbalizes understanding of appt date/time, parking situation and where to check in, pre-test NPO status and medications ordered, and verified current allergies; name and call back number provided for further questions should they arise Alda Gaultney RN Navigator Cardiac Imaging [facility] [phone] office [phone] cell  Pt denies covid symptoms, verbalized understanding of visitor policy. 

## 2019-05-10 ENCOUNTER — Ambulatory Visit (HOSPITAL_COMMUNITY)
Admission: RE | Admit: 2019-05-10 | Discharge: 2019-05-10 | Disposition: A | Discharge status: Home / Self Care | Payer: Commercial Managed Care - PPO | Source: Ambulatory Visit | Attending: Cardiology | Admitting: Cardiology

## 2019-05-10 ENCOUNTER — Ambulatory Visit (HOSPITAL_COMMUNITY): Payer: Commercial Managed Care - PPO

## 2019-05-10 ENCOUNTER — Other Ambulatory Visit: Payer: Self-pay

## 2019-05-10 ENCOUNTER — Encounter (HOSPITAL_COMMUNITY): Payer: Self-pay

## 2019-05-10 DIAGNOSIS — R0789 Other chest pain: Secondary | ICD-10-CM | POA: Insufficient documentation

## 2019-05-10 DIAGNOSIS — I480 Paroxysmal atrial fibrillation: Secondary | ICD-10-CM | POA: Diagnosis not present

## 2019-05-10 DIAGNOSIS — R079 Chest pain, unspecified: Secondary | ICD-10-CM

## 2019-05-10 MED ORDER — IOHEXOL 350 MG/ML SOLN
100.0000 mL | Freq: Once | INTRAVENOUS | Status: AC | PRN
  Administered 2019-05-10: 16:00:00 100 mL via INTRAVENOUS

## 2019-05-10 MED ORDER — NITROGLYCERIN 0.4 MG SL SUBL
0.8000 mg | SUBLINGUAL_TABLET | Freq: Once | SUBLINGUAL | Status: AC
  Administered 2019-05-10: 16:00:00 0.8 mg via SUBLINGUAL
  Filled 2019-05-10: qty 25

## 2019-05-10 MED ORDER — NITROGLYCERIN 0.4 MG SL SUBL
SUBLINGUAL_TABLET | SUBLINGUAL | Status: AC
  Filled 2019-05-10: qty 2

## 2019-05-10 NOTE — Telephone Encounter (Signed)
New Message             Patient is calling because she was told something different about her medication by hospital staff, it's different from what you told her. Pls call to clarify. This is for [facility] or who ever can help her. Patient has a CT today at 3:30. Pls advise.

## 2019-05-10 NOTE — Telephone Encounter (Signed)
Spoke with the patient, she had questions about her instructions. I reviewed them with her and she had no further questions.

## 2019-05-10 NOTE — Progress Notes (Signed)
Pt tolerated exam without issue.  Pt c/o H/A after nitro admin, caffeine given to patient.  Discharge instructions given verbally and written to patient.  PIV removed and dressing applied.  Pt discharged.

## 2019-05-10 NOTE — Discharge Instructions (Signed)
What You Need to Know About IV Contrast Material °IV contrast material is most often a fluid that is used with some imaging tests. Contrast material is injected into your body through a vein to help your health care providers see your organs and tissues more clearly. It may be used with: °· X-ray. °· MRI. °· CT. °· Ultrasound. °Contrast material is used when your health care providers need a detailed look at organs, tissues, or blood vessels that may not show up with the standard test. IV contrast may be used for imaging tests that examine: °· Muscles, skin, and fat. °· Breasts. °· Brain. °· Digestive tract. °· Heart. °· Liver. °· Lungs and many other internal organs. °What are the risks of using IV contrast material? °The risks of using IV contrast material include: °· Headache. °· Itching, skin rash, and hives. °· Allergic reactions. °· Nausea and vomiting. °· Wheezing or difficulty breathing. °· Abnormal heart rate. °· Blood pressure changes. °· Throat swelling. °· Kidney damage. °These complications are more likely to occur in people who: °· Have kidney failure. °· Have liver problems. °· Have certain heart problems, including: °? Heart failure. °? Heart attack. °? Heart infection. °? Heart valve problems. °· Abuse alcohol. °· Have allergies or asthma. °· Are dehydrated. °· Have sickle cell anemia or similar problems. °· Have had trouble with IV contrast material in the past. °· Take certain medicines, such as: °? Metformin. °? NSAIDs. °? Beta blockers. °? Interleukin-2. °How do I prepare for my test with IV contrast material? °· Follow instructions from your health care provider about eating or drinking restrictions. °· Ask your health care provider about changing or stopping your regular medicines. This is especially important if you are taking diabetes medicines or blood thinners. °· Tell your health care provider about: °? Any previous illnesses, surgeries, or pre-existing medical conditions. °? Whether you  are pregnant or may be pregnant. °? Whether you are breastfeeding. Most contrast agents are safe for use in breastfeeding women. °· You may have a physical exam to determine any potential risks. °· Ask if you will be given a medicine (sedative) to help you relax during the procedure. If so, plan to have someone take you home after test. °What happens during the test with IV contrast material? ° °· You may be given a sedative to help you relax. °· A needle will be inserted into one of your veins to administer the IV contrast material. °· You may feel warmth or flushing as the material enters your bloodstream. °· You may have a metallic taste in your mouth for a few minutes. °· The needle may cause some discomfort and bruising. °· After the contrast material is in your body, the imaging test will be done. °The procedure may vary among health care providers and hospitals. °What happens after the test with IV contrast material? °· You may be asked to drink water or other fluids to wash (flush) the contrast material out of your body. °· Drink enough fluid to keep your urine pale yellow. °· Do not drive for 24 hours if you received a sedative. °· It is your responsibility to get your test results. Ask your health care provider or the department performing the test when your results will be ready. °Contact a health care provider if: °· You have redness, swelling, or pain near your IV site. °Get help right away if: °· You have an abnormal heart rhythm. °· You have trouble breathing. °· You have: °?   Chest pain. ? Pain in your back, neck, arm, jaw, or stomach. ? Nausea or sweating. ? Hives or a rash.  You start shaking and cannot stop. These symptoms may represent a serious problem that is an emergency. Do not wait to see if the symptoms will go away. Get medical help right away. Call your local emergency services (911 in the [place] Do not drive yourself to the hospital. Summary  IV contrast may be used for imaging  tests to help your health care providers see your organs and tissues more clearly.  Tell your health care provider if you are pregnant or may be pregnant.  During the procedure, you may feel warmth or flushing as the material enters your bloodstream.  After the procedure, drink enough fluid to keep your urine pale yellow. This information is not intended to replace advice given to you by your health care provider. Make sure you discuss any questions you have with your health care provider. Document Released: 11/11/2009 Document Revised: 07/18/2018 Document Reviewed: 07/31/2015 Elsevier Interactive Patient Education  2019 [employer].   Cardiac CT Angiogram  A cardiac CT angiogram is a procedure to look at the heart and the area around the heart. It may be done to help find the cause of chest pains or other symptoms of heart disease. During this procedure, a large X-ray machine, called a CT scanner, takes detailed pictures of the heart and the surrounding area after a dye (contrast material) has been injected into blood vessels in the area. The procedure is also sometimes called a coronary CT angiogram, coronary artery scanning, or CTA. A cardiac CT angiogram allows the health care provider to see how well blood is flowing to and from the heart. The health care provider will be able to see if there are any problems, such as:  Blockage or narrowing of the coronary arteries in the heart.  Fluid around the heart.  Signs of weakness or disease in the muscles, valves, and tissues of the heart. Tell a health care provider about:  Any allergies you have. This is especially important if you have had a previous allergic reaction to contrast dye.  All medicines you are taking, including vitamins, herbs, eye drops, creams, and over-the-counter medicines.  Any blood disorders you have.  Any surgeries you have had.  Any medical conditions you have.  Whether you are pregnant or may be  pregnant.  Any anxiety disorders, chronic pain, or other conditions you have that may increase your stress or prevent you from lying still. What are the risks? Generally, this is a safe procedure. However, problems may occur, including:  Bleeding.  Infection.  Allergic reactions to medicines or dyes.  Damage to other structures or organs.  Kidney damage from the dye or contrast that is used.  Increased risk of cancer from radiation exposure. This risk is low. Talk with your health care provider about: ? The risks and benefits of testing. ? How you can receive the lowest dose of radiation. What happens before the procedure?  Wear comfortable clothing and remove any jewelry, glasses, dentures, and hearing aids.  Follow instructions from your health care provider about eating and drinking. This may include: ? For 12 hours before the test -- avoid caffeine. This includes tea, coffee, soda, energy drinks, and diet pills. Drink plenty of water or other fluids that do not have caffeine in them. Being well-hydrated can prevent complications. ? For 4-6 hours before the test -- stop eating and drinking. The  contrast dye can cause nausea, but this is less likely if your stomach is empty.  Ask your health care provider about changing or stopping your regular medicines. This is especially important if you are taking diabetes medicines, blood thinners, or medicines to treat erectile dysfunction. What happens during the procedure?  Hair on your chest may need to be removed so that small sticky patches called electrodes can be placed on your chest. These will transmit information that helps to monitor your heart during the test.  An IV tube will be inserted into one of your veins.  You might be given a medicine to control your heart rate during the test. This will help to ensure that good images are obtained.  You will be asked to lie on an exam table. This table will slide in and out of the CT  machine during the procedure.  Contrast dye will be injected into the IV tube. You might feel warm, or you may get a metallic taste in your mouth.  You will be given a medicine (nitroglycerin) to relax (dilate) the arteries in your heart.  The table that you are lying on will move into the CT machine tunnel for the scan.  The person running the machine will give you instructions while the scans are being done. You may be asked to: ? Keep your arms above your head. ? Hold your breath. ? Stay very still, even if the table is moving.  When the scanning is complete, you will be moved out of the machine.  The IV tube will be removed. The procedure may vary among health care providers and hospitals. What happens after the procedure?  You might feel warm, or you may get a metallic taste in your mouth from the contrast dye.  You may have a headache from the nitroglycerin.  After the procedure, drink water or other fluids to wash (flush) the contrast material out of your body.  Contact a health care provider if you have any symptoms of allergy to the contrast. These symptoms include: ? Shortness of breath. ? Rash or hives. ? A racing heartbeat.  Most people can return to their normal activities right after the procedure. Ask your health care provider what activities are safe for you.  It is up to you to get the results of your procedure. Ask your health care provider, or the department that is doing the procedure, when your results will be ready. Summary  A cardiac CT angiogram is a procedure to look at the heart and the area around the heart. It may be done to help find the cause of chest pains or other symptoms of heart disease.  During this procedure, a large X-ray machine, called a CT scanner, takes detailed pictures of the heart and the surrounding area after a dye (contrast material) has been injected into blood vessels in the area.  Ask your health care provider about changing or  stopping your regular medicines before the procedure. This is especially important if you are taking diabetes medicines, blood thinners, or medicines to treat erectile dysfunction.  After the procedure, drink water or other fluids to wash (flush) the contrast material out of your body. This information is not intended to replace advice given to you by your health care provider. Make sure you discuss any questions you have with your health care provider. Document Released: 11/05/2008 Document Revised: 10/12/2016 Document Reviewed: 10/12/2016 Elsevier Interactive Patient Education  2019 [employer].

## 2019-05-22 ENCOUNTER — Ambulatory Visit (HOSPITAL_COMMUNITY)
Admission: RE | Admit: 2019-05-22 | Discharge: 2019-05-22 | Disposition: A | Discharge status: Home / Self Care | Payer: Commercial Managed Care - PPO | Source: Ambulatory Visit | Attending: Physician Assistant | Admitting: Physician Assistant

## 2019-05-22 ENCOUNTER — Other Ambulatory Visit (HOSPITAL_COMMUNITY): Payer: Self-pay | Admitting: *Deleted

## 2019-05-22 ENCOUNTER — Other Ambulatory Visit: Payer: Self-pay

## 2019-05-22 DIAGNOSIS — I480 Paroxysmal atrial fibrillation: Secondary | ICD-10-CM | POA: Diagnosis not present

## 2019-05-22 MED ORDER — FLECAINIDE ACETATE 50 MG PO TABS: 50.0000 mg | ORAL_TABLET | Freq: Two times a day (BID) | ORAL | 3 refills | Status: DC

## 2019-05-22 NOTE — Progress Notes (Signed)
Electrophysiology TeleHealth Note   Due to national recommendations of social distancing due to [place] 19, Audio/video telehealth visit is felt to be most appropriate for this patient at this time.  See MyChart message for patient consent regarding telehealth for the Atrial Fibrillation Clinic. Consent obtained verbally.   Date:  05/22/2019   ID:  Janice Flores, DOB 12-26-1975, MRN [id number]  Location: home  Provider location: [address] [place], [place] [address] Evaluation Performed: Follow up  PCP:  Donald Prose, MD  Primary Cardiologist: Dr Radford Pax   CC: Follow up for atrial fibrillation.    History of Present Illness: Janice Flores is a 43 y.o. female who presents via audio/video conferencing for a telehealth visit today. The patient is referred for new consultation regarding atrial fibrillation and atrial flutter by Dr Domenic Polite and [facility]. Patient reports that in February she woke in the middle of the night with palpations and SOB. She presented to the ER and was found to be in afib with RVR. She was given metoprolol on discharge and patient reports she spontaneously converted to SR later that day. She had another episode of heart racing on 04/03/19 and again went to the ER and was found to be in atrial flutter with variable conduction. Cardioversion was not pursued at that time. Patient feels that she has been persistently out of rhythm since her ER visit with nearly constant symptoms of palpitations, periodic lightheadedness, and SOB on exertion. Her Apple Watch has shown labile HR up to 140s. She denies snoring or significant alcohol use. There were no triggers during each of these episodes that the patient could identify.   On follow up today, patient reports that she is doing very well with only one episode of palpitations which lasted about 5 minutes. She has been much more active and is exercising daily. CTA completed which showed calcium score  of 0 with no CAD.  Today, she denies symptoms of chest pain, orthopnea, PND, shortness of breath, lower extremity edema, claudication, presyncope, syncope, bleeding, or neurologic sequela. The patient is tolerating medications without difficulties and is otherwise without complaint today. +palpitations.  she denies symptoms of cough, fevers, chills, or new SOB worrisome for COVID 19.     Atrial Fibrillation Risk Factors:  she does not have symptoms or diagnosis of sleep apnea. she does not have a history of rheumatic fever. she does not have a history of alcohol use. The patient does have a history of early familial atrial fibrillation or other arrhythmias. Mother has afib.  she has a BMI of There is no height or weight on file to calculate BMI.. There were no vitals filed for this visit.  Past Medical History:  Diagnosis Date  . Anemia   . Asthma   . Atrial fibrillation with rapid ventricular response ([place]) 01/12/2019  . Bradycardia 08/13/2016  . Headache   . HSV infection   . Hypothyroidism   . Iron deficiency   . Tachycardia    intermittent tachycardia  . Vaginal Pap smear, abnormal    colposcopy   . Vitamin D deficiency    Past Surgical History:  Procedure Laterality Date  . CESAREAN SECTION N/A 04/21/2015   Procedure: CESAREAN SECTION;  Surgeon: Everett Graff, MD;  Location: [facility];  Service: Obstetrics;  Laterality: N/A;  . removal of polyp from uterus       Current Outpatient Medications  Medication Sig Dispense Refill  . apixaban (ELIQUIS) 5 MG TABS tablet  Take 1 tablet (5 mg total) by mouth 2 (two) times daily. (Patient not taking: Reported on 04/11/2019) 60 tablet 6  . cetirizine (ZYRTEC) 10 MG chewable tablet Chew 10 mg by mouth daily.    Marland Kitchen levothyroxine (SYNTHROID, LEVOTHROID) 75 MCG tablet Take 75 mcg by mouth daily before breakfast.    . metoprolol succinate (TOPROL-XL) 25 MG 24 hr tablet Take 2 tablets (50 mg total) by mouth daily.  2  . metoprolol tartrate  (LOPRESSOR) 100 MG tablet Take 100 mg, 1 tablet, by mouth, 2 hours before your Cardiac CT. 1 tablet 0  . Multiple Vitamins-Minerals (MULTIVITAMIN ADULT PO) Take 1 tablet by mouth daily.     . SUMAtriptan (IMITREX) 50 MG tablet Take 1 tablet (50 mg total) by mouth every 2 (two) hours as needed for migraine. May repeat in 2 hours if headache persists or recurs. 10 tablet 6   No current facility-administered medications for this encounter.     Allergies:   Other   Social History:  The patient  reports that she has never smoked. She has never used smokeless tobacco. She reports current alcohol use. She reports that she does not use drugs.   Family History:  The patient's  family history includes Asthma in her brother; Hypercholesterolemia in her mother; Hypertension in her father; Sarcoidosis in her mother; Sickle cell trait in her brother.    ROS:  Please see the history of present illness.   All other systems are personally reviewed and negative.   Exam: Well appearing, alert and conversant, regular work of breathing,  good skin color Eyes- anicteric, neuro- grossly intact, skin- no apparent rash or lesions or cyanosis, mouth- oral mucosa is pink   Recent Labs: 01/11/2019: ALT 11 01/12/2019: TSH 3.170 04/03/2019: BUN 15; Creatinine, Ser 0.82; Hemoglobin 12.9; Platelets 370; Potassium 4.2; Sodium 136  personally reviewed    Other studies personally reviewed: Additional studies/ records that were reviewed today include: Epic notes, echocardiogram  Echocardiogram 01/18/19 1. The left ventricle has hyperdynamic systolic function, with an ejection fraction of >65%. The cavity size was normal. Left ventricular diastolic parameters were normal.  2. The right ventricle has normal systolic function. The cavity was normal. There is no increase in right ventricular wall thickness.  3. The mitral valve is normal in structure.  4. The tricuspid valve is normal in structure.  5. The aortic valve is  normal in structure. no stenosis of the aortic valve.  6. The aortic root and ascending aorta are normal in size and structure.  7. The interatrial septum was not assessed.   ASSESSMENT AND PLAN:  1. Paroxysmal atrial fibrillation/atrial flutter Cardiac CTA calcium score of 0 with no CAD. Patient doing well with only one brief episode of palpitations. Continue metoprolol 50 mg daily Will start flecainide 50 mg BID. Return for ECG and will set up ETT. Pt completed 4 weeks of anticoagulation. No indication for long term anticoagulation at this point. Stop Eliquis.  Continue metoprolol 50 mg daily  This patients CHA2DS2-VASc Score and unadjusted Ischemic Stroke Rate (% per year) is equal to 0.6 % stroke rate/year from a score of 1  Above score calculated as 1 point each if present [CHF, HTN, DM, Vascular=MI/PAD/Aortic Plaque, Age if 65-74, or Female] Above score calculated as 2 points each if present [Age > 75, or Stroke/TIA/TE]  2. Overweight  Lifestyle modification was discussed and encouraged including regular physical activity and weight reduction. Patient doing well with exercising regularly.  COVID screen The patient does not have any symptoms that suggest any further testing/ screening at this time.  Social distancing reinforced today.    Follow-up with AF clinic in 2 weeks for ECG.   Current medicines are reviewed at length with the patient today.   The patient does not have concerns regarding her medicines.  The following changes were made today: stop Eliquis, start flecainide.  Labs/ tests ordered today include:  No orders of the defined types were placed in this encounter.   Patient Risk:  after full review of this patients clinical status, I feel that they are at moderate risk at this time.   Today, I have spent 11 minutes with the patient with telehealth technology discussing atrial fibrillation, anticoagulation, flecainide.   Gwenlyn Perking PA-C  05/22/2019 10:12 AM  Af[facility] [address] [place], [place] [address] [phone]

## 2019-06-01 ENCOUNTER — Telehealth (HOSPITAL_COMMUNITY): Payer: Commercial Managed Care - PPO | Admitting: Physician Assistant

## 2019-06-05 ENCOUNTER — Ambulatory Visit (HOSPITAL_COMMUNITY)
Admission: RE | Admit: 2019-06-05 | Discharge: 2019-06-05 | Disposition: A | Discharge status: Home / Self Care | Payer: Commercial Managed Care - PPO | Source: Ambulatory Visit | Attending: Physician Assistant | Admitting: Physician Assistant

## 2019-06-05 ENCOUNTER — Encounter (HOSPITAL_COMMUNITY): Payer: Self-pay | Admitting: Physician Assistant

## 2019-06-05 ENCOUNTER — Other Ambulatory Visit: Payer: Self-pay

## 2019-06-05 VITALS — BP 112/62 | HR 47 | Ht 67.0 in

## 2019-06-05 DIAGNOSIS — R001 Bradycardia, unspecified: Secondary | ICD-10-CM | POA: Diagnosis not present

## 2019-06-05 DIAGNOSIS — I480 Paroxysmal atrial fibrillation: Secondary | ICD-10-CM

## 2019-06-05 NOTE — Progress Notes (Signed)
Patient returns for ECG after starting flecainide. ECG shows sinus bradycardia HR 47, LAD, PR 142, QRS 98, QTc 369. Patient reports no symptoms related to bradycardia or afib. Continue present therapy. F/u in AF clinic in 3 months.

## 2019-06-07 ENCOUNTER — Other Ambulatory Visit: Payer: Self-pay | Admitting: Obstetrics and Gynecology

## 2019-07-11 ENCOUNTER — Other Ambulatory Visit (HOSPITAL_COMMUNITY): Payer: Self-pay | Admitting: *Deleted

## 2019-07-11 MED ORDER — FLECAINIDE ACETATE 50 MG PO TABS: 50.0000 mg | ORAL_TABLET | Freq: Two times a day (BID) | ORAL | 2 refills | Status: DC

## 2019-07-17 ENCOUNTER — Other Ambulatory Visit (HOSPITAL_COMMUNITY): Payer: Self-pay | Admitting: *Deleted

## 2019-07-17 MED ORDER — METOPROLOL SUCCINATE ER 25 MG PO TB24: 50.0000 mg | ORAL_TABLET | Freq: Every day | ORAL | 0 refills | Status: DC

## 2019-07-24 ENCOUNTER — Other Ambulatory Visit: Payer: Self-pay | Admitting: Family Medicine

## 2019-07-24 DIAGNOSIS — R202 Paresthesia of skin: Secondary | ICD-10-CM

## 2019-07-24 DIAGNOSIS — R519 Headache, unspecified: Secondary | ICD-10-CM

## 2019-07-24 DIAGNOSIS — G43009 Migraine without aura, not intractable, without status migrainosus: Secondary | ICD-10-CM

## 2019-08-08 ENCOUNTER — Other Ambulatory Visit: Payer: Self-pay | Admitting: Family Medicine

## 2019-08-08 DIAGNOSIS — R519 Headache, unspecified: Secondary | ICD-10-CM

## 2019-08-08 DIAGNOSIS — R202 Paresthesia of skin: Secondary | ICD-10-CM

## 2019-08-08 DIAGNOSIS — G43009 Migraine without aura, not intractable, without status migrainosus: Secondary | ICD-10-CM

## 2019-08-10 ENCOUNTER — Other Ambulatory Visit: Payer: Self-pay

## 2019-08-10 ENCOUNTER — Ambulatory Visit: Payer: Commercial Managed Care - PPO | Admitting: Neurology

## 2019-08-10 ENCOUNTER — Encounter: Payer: Self-pay | Admitting: Neurology

## 2019-08-10 VITALS — BP 111/69 | HR 60 | Temp 97.7°F | Ht 67.0 in | Wt 183.5 lb

## 2019-08-10 DIAGNOSIS — R202 Paresthesia of skin: Secondary | ICD-10-CM | POA: Diagnosis not present

## 2019-08-10 DIAGNOSIS — G43709 Chronic migraine without aura, not intractable, without status migrainosus: Secondary | ICD-10-CM

## 2019-08-10 DIAGNOSIS — IMO0002 Reserved for concepts with insufficient information to code with codable children: Secondary | ICD-10-CM

## 2019-08-10 MED ORDER — NORTRIPTYLINE HCL 10 MG PO CAPS: 20.0000 mg | ORAL_CAPSULE | Freq: Every day | ORAL | 11 refills | Status: DC

## 2019-08-10 MED ORDER — SUMATRIPTAN SUCCINATE 50 MG PO TABS: 50.0000 mg | ORAL_TABLET | ORAL | 11 refills | Status: DC | PRN

## 2019-08-10 NOTE — Progress Notes (Signed)
PATIENT: Janice Flores DOB: 1976/01/23  Chief Complaint  Patient presents with  . Migraine/Tingling    Last seen 12/12/2015. Reports an increase in headache frequency (at least two days each week).  Sumatriptan is typically helpful for her pain. She is also having intermittent tingling in her face (mostly left side) and in her left fingers.   Marland Kitchen PCP    Donald Prose, MD     HISTORICAL  Janice Flores is a 43 year old right-handed female alone at today's clinical visit, seen in refer by  her primary care physician Dr.Vyvyan Sun for evaluation of headaches on August 10, 2019, I saw her previously on November 06 2015 for migraine  I have reviewed and summarize her most recent office note, she had a history of hypothyroidism, intermittent asthma, iron deficiency anemia, vitamin D deficiency, idiopathic scoliosis of lumbar region, history of herpes simplex type II, PVCs  She started to have headaches since 2013, she reported sudden onset transient sharp headache lasting for few seconds with sudden positional change, for a while, it has improved, but since October 2016, she began to have frequent occurrence, multiple episodes in a day, usually happened with sudden positional change, turning her head, bending over, getting up from bed  In addition she also had a history of migraine headaches, above-mentioned headache is different from her typical migraine, her typical migraine left frontal behind eye severe pounding headache with associated light noise sensitivity, nauseous, lasting for 1 day,   She is currently still nursing her 83-month-old son, tried Tylenol for migraine with limited help, she was started on preventive medication magnesium oxide, riboflavin, which has been helpful, 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, no evidence of compression, headache was much improved, has lost follow-up,  She returns today for worsening headache since March 2020  Her headache is  usually triggered by strong smells, such as perfume, cleaning agent, exertion, sudden head movement, her typical headache left parietal retro-orbital region pounding headache with associated light, noise sensitivity, nauseous, ringing in ears, lasting for few hours, she also complains of intermittent left face, left finger paresthesia  She is having migraine headaches 3-4 times each week, tried Imitrex without significant improvement,    REVIEW OF SYSTEMS: Full 14 system review of systems performed and notable only for as above All other review of systems were negative.  ALLERGIES: Allergies  Allergen Reactions  . Other Swelling    cherries    HOME MEDICATIONS: Current Outpatient Medications  Medication Sig Dispense Refill  . cetirizine (ZYRTEC) 10 MG chewable tablet Chew 10 mg by mouth as needed.     . flecainide (TAMBOCOR) 50 MG tablet Take 1 tablet (50 mg total) by mouth 2 (two) times daily. 180 tablet 2  . levothyroxine (SYNTHROID, LEVOTHROID) 75 MCG tablet Take 75 mcg by mouth daily before breakfast.    . metoprolol succinate (TOPROL-XL) 25 MG 24 hr tablet Take 2 tablets (50 mg total) by mouth daily. 60 tablet 0  . Multiple Vitamins-Minerals (MULTIVITAMIN ADULT PO) Take 1 tablet by mouth daily.     . SUMAtriptan (IMITREX) 50 MG tablet Take 1 tablet (50 mg total) by mouth every 2 (two) hours as needed for migraine. May repeat in 2 hours if headache persists or recurs. 10 tablet 6   No current facility-administered medications for this visit.     PAST MEDICAL HISTORY: Past Medical History:  Diagnosis Date  . Anemia   . Asthma   . Atrial fibrillation with rapid  ventricular response ([place]) 01/12/2019  . Bradycardia 08/13/2016  . Headache   . HSV infection   . Hypothyroidism   . Iron deficiency   . Migraine   . Tachycardia    intermittent tachycardia  . Tingling   . Vaginal Pap smear, abnormal    colposcopy   . Vitamin D deficiency     PAST SURGICAL HISTORY: Past Surgical  History:  Procedure Laterality Date  . CESAREAN SECTION N/A 04/21/2015   Procedure: CESAREAN SECTION;  Surgeon: Everett Graff, MD;  Location: [facility];  Service: Obstetrics;  Laterality: N/A;  . removal of polyp from uterus      FAMILY HISTORY: Family History  Problem Relation Age of Onset  . Hypertension Father   . Hypercholesterolemia Mother   . Sarcoidosis Mother   . Asthma Brother   . Sickle cell trait Brother   . Breast cancer Neg Hx     SOCIAL HISTORY: Social History   Socioeconomic History  . Marital status: Single    Spouse name: Not on file  . Number of children: 1  . Years of education: BS  . Highest education level: Not on file  Occupational History  . Occupation: Garment/textile technologist  Social Needs  . Financial resource strain: Not on file  . Food insecurity    Worry: Not on file    Inability: Not on file  . Transportation needs    Medical: Not on file    Non-medical: Not on file  Tobacco Use  . Smoking status: Never Smoker  . Smokeless tobacco: Never Used  Substance and Sexual Activity  . Alcohol use: Yes    Comment: occasional  . Drug use: No  . Sexual activity: Yes  Lifestyle  . Physical activity    Days per week: Not on file    Minutes per session: Not on file  . Stress: Not on file  Relationships  . Social Herbalist on phone: Not on file    Gets together: Not on file    Attends religious service: Not on file    Active member of club or organization: Not on file    Attends meetings of clubs or organizations: Not on file    Relationship status: Not on file  . Intimate partner violence    Fear of current or ex partner: Not on file    Emotionally abused: Not on file    Physically abused: Not on file    Forced sexual activity: Not on file  Other Topics Concern  . Not on file  Social History Narrative   Lives at home with her mother and son.   Right-handed.   Occasional soda.     PHYSICAL EXAM   Vitals:   08/10/19 1011   BP: 111/69  Pulse: 60  Temp: 97.7 F (36.5 C)  Weight: 183 lb 8 oz (83.2 kg)  Height: 5\' 7"  (1.702 m)    Not recorded      Body mass index is 28.74 kg/m.  PHYSICAL EXAMNIATION:  Gen: NAD, conversant, well nourised, obese, well groomed                     Cardiovascular: Regular rate rhythm, no peripheral edema, warm, nontender. Eyes: Conjunctivae clear without exudates or hemorrhage Neck: Supple, no carotid bruits. Pulmonary: Clear to auscultation bilaterally   NEUROLOGICAL EXAM:  MENTAL STATUS: Speech:    Speech is normal; fluent and spontaneous with normal comprehension.  Cognition:  Orientation to time, place and person     Normal recent and remote memory     Normal Attention span and concentration     Normal Language, naming, repeating,spontaneous speech     Fund of knowledge   CRANIAL NERVES: CN II: Visual fields are full to confrontation pupils are round equal and briskly reactive to light. CN III, IV, VI: extraocular movement are normal. No ptosis. CN V: Facial sensation is intact to pinprick in all 3 divisions bilaterally. Corneal responses are intact.  CN VII: Face is symmetric with normal eye closure and smile. CN VIII: Hearing is normal to rubbing fingers CN IX, X: Palate elevates symmetrically. Phonation is normal. CN XI: Head turning and shoulder shrug are intact CN XII: Tongue is midline with normal movements and no atrophy.  MOTOR: There is no pronator drift of out-stretched arms. Muscle bulk and tone are normal. Muscle strength is normal.  REFLEXES: Reflexes are 2+ and symmetric at the biceps, triceps, knees, and ankles. Plantar responses are flexor.  SENSORY: Intact to light touch, pinprick, positional sensation and vibratory sensation are intact in fingers and toes.  COORDINATION: Rapid alternating movements and fine finger movements are intact. There is no dysmetria on finger-to-nose and heel-knee-shin.    GAIT/STANCE: Posture is normal.  Gait is steady with normal steps, base, arm swing, and turning. Heel and toe walking are normal. Tandem gait is normal.  Romberg is absent.   DIAGNOSTIC DATA (LABS, IMAGING, TESTING) - I reviewed patient records, labs, notes, testing and imaging myself where available.   ASSESSMENT AND PLAN  Ladasia Kizzee Furney is a 43 y.o. female   Worsening headaches Worsening left side paresthesia  Proceed with MRI of brain with and without contrast was ordered by her primary care physician  Nortriptyline 10mg  titrating to 20 mg every night as preventive medication  Imitrex 50 mg as needed   Marcial Pacas, M.D. Ph.D.  [facility] [address] [place], [place] [address] Ph: [phone] Fax: [phone]  CC: Donald Prose, MD

## 2019-08-15 ENCOUNTER — Telehealth: Payer: Self-pay | Admitting: Cardiology

## 2019-08-15 MED ORDER — METOPROLOL SUCCINATE ER 25 MG PO TB24: 25.0000 mg | ORAL_TABLET | Freq: Every day | ORAL | 0 refills | Status: DC

## 2019-08-15 NOTE — Telephone Encounter (Signed)
New message:   Patient mother calling concerning her medications she is having some side effects and patient is having headaches. Patient also not sleeping. Please call patient mother.

## 2019-08-15 NOTE — Telephone Encounter (Signed)
Decrease Toprol to 25mg  daily and check HR and BP daily for a week and call

## 2019-08-15 NOTE — Telephone Encounter (Signed)
Spoke with the pt and she is reporting numbness and tingling on her left side of her face and fingers for the past month.. she denies facial drooping, weakness, and slurred speech.. no chest pain.. she has been seeing Dr. Krista Blue, Neurology, for migraine headaches and he has ordered an MRI of her brain... for 09/04/19.  She is asking if any of her cardiac meds could be adding to her symptoms.   She has had a few palpitations over the past few weeks but nothing out of her usual. Her HR has been in the 40's and 50's... she is unaware of her BP.   I will forward to Dr. Bethena Roys PA for review... but I asked her to talk with Dr. Krista Blue further if her symptoms are worsening about possibly moving her MRI up sooner.

## 2019-08-15 NOTE — Telephone Encounter (Signed)
Pt verbalized understanding and will keep track of her BP and HR.

## 2019-08-18 ENCOUNTER — Ambulatory Visit
Admission: RE | Admit: 2019-08-18 | Discharge: 2019-08-18 | Disposition: A | Discharge status: Home / Self Care | Payer: Commercial Managed Care - PPO | Source: Ambulatory Visit | Attending: Family Medicine | Admitting: Family Medicine

## 2019-08-18 DIAGNOSIS — R519 Headache, unspecified: Secondary | ICD-10-CM

## 2019-08-18 DIAGNOSIS — R202 Paresthesia of skin: Secondary | ICD-10-CM

## 2019-08-18 DIAGNOSIS — G43009 Migraine without aura, not intractable, without status migrainosus: Secondary | ICD-10-CM

## 2019-08-18 MED ORDER — GADOBENATE DIMEGLUMINE 529 MG/ML IV SOLN
17.0000 mL | Freq: Once | INTRAVENOUS | Status: AC | PRN
  Administered 2019-08-18: 17 mL via INTRAVENOUS

## 2019-08-22 ENCOUNTER — Institutional Professional Consult (permissible substitution): Payer: Commercial Managed Care - PPO | Admitting: Neurology

## 2019-09-04 ENCOUNTER — Other Ambulatory Visit: Payer: Commercial Managed Care - PPO

## 2019-09-04 ENCOUNTER — Other Ambulatory Visit: Payer: Self-pay

## 2019-09-04 DIAGNOSIS — Z20822 Contact with and (suspected) exposure to covid-19: Secondary | ICD-10-CM

## 2019-09-05 LAB — NOVEL CORONAVIRUS, NAA: SARS-CoV-2, NAA: NOT DETECTED

## 2019-09-06 ENCOUNTER — Encounter (HOSPITAL_COMMUNITY): Payer: Self-pay | Admitting: Physician Assistant

## 2019-09-06 ENCOUNTER — Other Ambulatory Visit: Payer: Self-pay

## 2019-09-06 ENCOUNTER — Ambulatory Visit (HOSPITAL_COMMUNITY)
Admission: RE | Admit: 2019-09-06 | Discharge: 2019-09-06 | Disposition: A | Discharge status: Home / Self Care | Payer: Commercial Managed Care - PPO | Source: Ambulatory Visit | Attending: Physician Assistant | Admitting: Physician Assistant

## 2019-09-06 VITALS — BP 116/74 | HR 65 | Ht 67.0 in | Wt 186.0 lb

## 2019-09-06 DIAGNOSIS — I480 Paroxysmal atrial fibrillation: Secondary | ICD-10-CM

## 2019-09-06 DIAGNOSIS — E559 Vitamin D deficiency, unspecified: Secondary | ICD-10-CM | POA: Diagnosis not present

## 2019-09-06 DIAGNOSIS — Z79899 Other long term (current) drug therapy: Secondary | ICD-10-CM | POA: Diagnosis not present

## 2019-09-06 DIAGNOSIS — J45909 Unspecified asthma, uncomplicated: Secondary | ICD-10-CM | POA: Diagnosis not present

## 2019-09-06 DIAGNOSIS — E039 Hypothyroidism, unspecified: Secondary | ICD-10-CM | POA: Diagnosis not present

## 2019-09-06 DIAGNOSIS — Z79890 Hormone replacement therapy (postmenopausal): Secondary | ICD-10-CM | POA: Insufficient documentation

## 2019-09-06 DIAGNOSIS — I4892 Unspecified atrial flutter: Secondary | ICD-10-CM | POA: Insufficient documentation

## 2019-09-06 NOTE — Progress Notes (Signed)
Primary Care Physician: Donald Prose, MD Primary Cardiologist: Dr Radford Pax Primary Electrophysiologist: none Referring Physician: Dr Maren Beach Janice Flores is a 43 y.o. female with a history of asthma, hypothyroidism, and paroxysmal atrial fibrillation who presents for follow up in the [facility]. Patient reports that in February she woke in the middle of the night with palpations and SOB. She presented to the ER and was found to be in afib with RVR. She was given metoprolol on discharge and patient reports she spontaneously converted to SR later that day. She had another episode of heart racing on 04/03/19 and again went to the ER and was found to be in atrial flutter with variable conduction. Cardioversion was not pursued at that time. Patient feels that she has been persistently out of rhythm since her ER visit with nearly constant symptoms of palpitations, periodic lightheadedness, and SOB on exertion. Her Apple Watch has shown labile HR up to 140s. She denies snoring or significant alcohol use. There were no triggers during each of these episodes that the patient could identify. She was started on flecainide on 05/22/19.  On follow up today, patient reports she has done well since her last visit. She was having some headache/face tingling symptoms and her BB was reduced. These symptoms have resolved. She did have one episode of palpitations which lasted about 5 minutes and then resolved.   Today, she denies symptoms of chest pain, shortness of breath, orthopnea, PND, lower extremity edema, dizziness, presyncope, syncope, snoring, daytime somnolence, bleeding, or neurologic sequela. The patient is tolerating medications without difficulties and is otherwise without complaint today.    Atrial Fibrillation Risk Factors:  she does not have symptoms or diagnosis of sleep apnea. she does not have a history of rheumatic fever. she does not have a history of  alcohol use. The patient does have a history of early familial atrial fibrillation or other arrhythmias. Mother has afib.  she has a BMI of Body mass index is 29.13 kg/m.Marland Kitchen Filed Weights   09/06/19 0941  Weight: 84.4 kg    Family History  Problem Relation Age of Onset  . Hypertension Father   . Hypercholesterolemia Mother   . Sarcoidosis Mother   . Asthma Brother   . Sickle cell trait Brother   . Breast cancer Neg Hx      Atrial Fibrillation Management history:  Previous antiarrhythmic drugs: flecainide Previous cardioversions: none Previous ablations: none CHADS2VASC score: 1 Anticoagulation history: Eliquis   Past Medical History:  Diagnosis Date  . Anemia   . Asthma   . Atrial fibrillation with rapid ventricular response ([place]) 01/12/2019  . Bradycardia 08/13/2016  . Headache   . HSV infection   . Hypothyroidism   . Iron deficiency   . Migraine   . Tachycardia    intermittent tachycardia  . Tingling   . Vaginal Pap smear, abnormal    colposcopy   . Vitamin D deficiency    Past Surgical History:  Procedure Laterality Date  . CESAREAN SECTION N/A 04/21/2015   Procedure: CESAREAN SECTION;  Surgeon: Everett Graff, MD;  Location: [facility];  Service: Obstetrics;  Laterality: N/A;  . removal of polyp from uterus      Current Outpatient Medications  Medication Sig Dispense Refill  . cetirizine (ZYRTEC) 10 MG chewable tablet Chew 10 mg by mouth as needed.     . flecainide (TAMBOCOR) 50 MG tablet Take 1 tablet (50 mg total) by mouth 2 (two) times  daily. 180 tablet 2  . levothyroxine (SYNTHROID, LEVOTHROID) 75 MCG tablet Take 75 mcg by mouth daily before breakfast.    . metoprolol succinate (TOPROL-XL) 25 MG 24 hr tablet Take 1 tablet (25 mg total) by mouth daily. 60 tablet 0  . Multiple Vitamins-Minerals (MULTIVITAMIN ADULT PO) Take 1 tablet by mouth daily.     . nortriptyline (PAMELOR) 10 MG capsule Take 2 capsules (20 mg total) by mouth at bedtime. 60 capsule 11  .  SUMAtriptan (IMITREX) 50 MG tablet Take 1 tablet (50 mg total) by mouth every 2 (two) hours as needed for migraine. May repeat in 2 hours if headache persists or recurs. 12 tablet 11   No current facility-administered medications for this encounter.     Allergies  Allergen Reactions  . Other Swelling    cherries    Social History   Socioeconomic History  . Marital status: Single    Spouse name: Not on file  . Number of children: 1  . Years of education: BS  . Highest education level: Not on file  Occupational History  . Occupation: Garment/textile technologist  Social Needs  . Financial resource strain: Not on file  . Food insecurity    Worry: Not on file    Inability: Not on file  . Transportation needs    Medical: Not on file    Non-medical: Not on file  Tobacco Use  . Smoking status: Never Smoker  . Smokeless tobacco: Never Used  Substance and Sexual Activity  . Alcohol use: Yes    Comment: occasional  . Drug use: No  . Sexual activity: Yes  Lifestyle  . Physical activity    Days per week: Not on file    Minutes per session: Not on file  . Stress: Not on file  Relationships  . Social Herbalist on phone: Not on file    Gets together: Not on file    Attends religious service: Not on file    Active member of club or organization: Not on file    Attends meetings of clubs or organizations: Not on file    Relationship status: Not on file  . Intimate partner violence    Fear of current or ex partner: Not on file    Emotionally abused: Not on file    Physically abused: Not on file    Forced sexual activity: Not on file  Other Topics Concern  . Not on file  Social History Narrative   Lives at home with her mother and son.   Right-handed.   Occasional soda.     ROS- All systems are reviewed and negative except as per the HPI above.  Physical Exam: Vitals:   09/06/19 0941  BP: 116/74  Pulse: 65  Weight: 84.4 kg  Height: 5\' 7"  (1.702 m)    GEN-  The patient is well appearing, alert and oriented x 3 today.   HEENT-head normocephalic, atraumatic, sclera clear, conjunctiva pink, hearing intact, trachea midline. Lungs- Clear to ausculation bilaterally, normal work of breathing Heart- Regular rate and rhythm, no murmurs, rubs or gallops  GI- soft, NT, ND, + BS Extremities- no clubbing, cyanosis, or edema MS- no significant deformity or atrophy Skin- no rash or lesion Psych- euthymic mood, full affect Neuro- strength and sensation are intact   Wt Readings from Last 3 Encounters:  09/06/19 84.4 kg  08/10/19 83.2 kg  04/11/19 85.7 kg    EKG today demonstrates SR HR 65, PR 174,  QRS 98, QTc 420  Echo 01/18/19 demonstrated  1. The left ventricle has hyperdynamic systolic function, with an ejection fraction of >65%. The cavity size was normal. Left ventricular diastolic parameters were normal.  2. The right ventricle has normal systolic function. The cavity was normal. There is no increase in right ventricular wall thickness.  3. The mitral valve is normal in structure.  4. The tricuspid valve is normal in structure.  5. The aortic valve is normal in structure. no stenosis of the aortic valve.  6. The aortic root and ascending aorta are normal in size and structure.  7. The interatrial septum was not assessed.  Epic records are reviewed at length today  Assessment and Plan:  1. Paroxysmal atrial fibrillation/atrial flutter Patient appears to be maintaining SR on flecainide with minimal symptoms.  Continue flecainide 50 mg BID. Will arrange TST. Continue Toprol 25 mg daily. No anticoagulation indicated at this time. Lifestyle modification was discussed and encouraged including regular physical activity and weight reduction. Patient doing well exercising regularly.   This patients CHA2DS2-VASc Score and unadjusted Ischemic Stroke Rate (% per year) is equal to 0.6 % stroke rate/year from a score of 1  Above score calculated as 1  point each if present [CHF, HTN, DM, Vascular=MI/PAD/Aortic Plaque, Age if 65-74, or Female] Above score calculated as 2 points each if present [Age > 75, or Stroke/TIA/TE]   Follow up with Dr Radford Pax per recall. AF clinic in 4 months.   [facility] [address] [place], [place] [address] [phone] 09/06/2019 9:46 AM

## 2019-09-12 ENCOUNTER — Telehealth (HOSPITAL_COMMUNITY): Payer: Self-pay | Admitting: Physician Assistant

## 2019-09-12 ENCOUNTER — Telehealth: Payer: Self-pay | Admitting: Cardiology

## 2019-09-12 NOTE — Telephone Encounter (Signed)
Returned patient's call regarding face and hand numbness/tingling. This had initially resolved with lower dose of BB but has returned, although not as severe. Will have her decrease Toprol to 12.5 mg daily. Of note, she recently had a brain MRI and will follow up with her neurologist soon to discuss. Patient to call with status update in one week.

## 2019-09-12 NOTE — Telephone Encounter (Signed)
Lm on VM to c/b to discuss concerns.

## 2019-09-12 NOTE — Telephone Encounter (Signed)
New Message  Pt c/o medication issue:  1. Name of Medication: flecainide (TAMBOCOR) 50 MG tablet   metoprolol succinate (TOPROL-XL) 25 MG 24 hr tablet  2. How are you currently taking this medication (dosage and times per day)? As Directed  3. Are you having a reaction (difficulty breathing--STAT)? Yes  4. What is your medication issue? Tingling in face, hands, and arms and it is freaking the patient out and affecting her sleep. Please call patient's mother and patient back to discuss.

## 2019-09-25 ENCOUNTER — Other Ambulatory Visit (HOSPITAL_COMMUNITY)
Admission: RE | Admit: 2019-09-25 | Discharge: 2019-09-25 | Disposition: A | Discharge status: Home / Self Care | Payer: Commercial Managed Care - PPO | Source: Ambulatory Visit | Attending: Physician Assistant | Admitting: Physician Assistant

## 2019-09-25 DIAGNOSIS — Z20828 Contact with and (suspected) exposure to other viral communicable diseases: Secondary | ICD-10-CM | POA: Insufficient documentation

## 2019-09-25 DIAGNOSIS — Z01812 Encounter for preprocedural laboratory examination: Secondary | ICD-10-CM | POA: Insufficient documentation

## 2019-09-26 ENCOUNTER — Telehealth (HOSPITAL_COMMUNITY): Payer: Self-pay

## 2019-09-26 ENCOUNTER — Other Ambulatory Visit: Payer: Self-pay | Admitting: Cardiology

## 2019-09-26 LAB — NOVEL CORONAVIRUS, NAA (HOSP ORDER, SEND-OUT TO REF LAB; TAT 18-24 HRS): SARS-CoV-2, NAA: NOT DETECTED

## 2019-09-26 NOTE — Telephone Encounter (Signed)
Encounter complete. 

## 2019-09-28 ENCOUNTER — Other Ambulatory Visit: Payer: Self-pay

## 2019-09-28 ENCOUNTER — Ambulatory Visit (HOSPITAL_COMMUNITY)
Admission: RE | Admit: 2019-09-28 | Discharge: 2019-09-28 | Disposition: A | Discharge status: Home / Self Care | Payer: Commercial Managed Care - PPO | Source: Ambulatory Visit | Attending: Cardiology | Admitting: Cardiology

## 2019-09-28 DIAGNOSIS — I480 Paroxysmal atrial fibrillation: Secondary | ICD-10-CM

## 2019-09-28 LAB — EXERCISE TOLERANCE TEST
Estimated workload: 12.4 METS
Exercise duration (min): 10 min
Exercise duration (sec): 27 s
MPHR: 175 {beats}/min
Peak HR: 160 {beats}/min
Percent HR: 91 %
Rest HR: 65 {beats}/min

## 2019-09-29 ENCOUNTER — Encounter (HOSPITAL_COMMUNITY): Payer: Self-pay | Admitting: *Deleted

## 2019-10-23 ENCOUNTER — Other Ambulatory Visit: Payer: Self-pay | Admitting: Obstetrics and Gynecology

## 2019-10-23 DIAGNOSIS — Z1231 Encounter for screening mammogram for malignant neoplasm of breast: Secondary | ICD-10-CM

## 2019-10-26 ENCOUNTER — Ambulatory Visit: Payer: Commercial Managed Care - PPO | Admitting: Neurology

## 2019-10-26 ENCOUNTER — Encounter: Payer: Self-pay | Admitting: Neurology

## 2019-10-26 ENCOUNTER — Other Ambulatory Visit: Payer: Self-pay

## 2019-10-26 VITALS — BP 118/62 | HR 62 | Temp 97.2°F | Ht 67.0 in | Wt 189.2 lb

## 2019-10-26 DIAGNOSIS — R202 Paresthesia of skin: Secondary | ICD-10-CM | POA: Diagnosis not present

## 2019-10-26 DIAGNOSIS — G43709 Chronic migraine without aura, not intractable, without status migrainosus: Secondary | ICD-10-CM | POA: Diagnosis not present

## 2019-10-26 DIAGNOSIS — IMO0002 Reserved for concepts with insufficient information to code with codable children: Secondary | ICD-10-CM

## 2019-10-26 NOTE — Progress Notes (Signed)
PATIENT: Janice Flores DOB: 04/28/1976  Chief Complaint  Patient presents with  . New consult    Rm Hall, alone.  2 month hx, L facial tingling, intermittent, also bilateral arms, fingers.   . Parethesias of face     HISTORICAL  Janice Flores is a 43 year old right-handed female alone at today's clinical visit, seen in refer by  her primary care physician Dr.Vyvyan Sun for evaluation of headaches on August 10, 2019, I saw her previously on November 06 2015 for migraine  I have reviewed and summarize her most recent office note, she had a history of hypothyroidism, intermittent asthma, iron deficiency anemia, vitamin D deficiency, idiopathic scoliosis of lumbar region, history of herpes simplex type II, PVCs  She started to have headaches since 2013, she reported sudden onset transient sharp headache lasting for few seconds with sudden positional change, for a while, it has improved, but since October 2016, she began to have frequent occurrence, multiple episodes in a day, usually happened with sudden positional change, turning her head, bending over, getting up from bed  In addition she also had a history of migraine headaches, above-mentioned headache is different from her typical migraine, her typical migraine left frontal behind eye severe pounding headache with associated light noise sensitivity, nauseous, lasting for 1 day,   She is currently still nursing her 70-month-old son, tried Tylenol for migraine with limited help, she was started on preventive medication magnesium oxide, riboflavin, which has been helpful, 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, no evidence of compression, headache was much improved, has lost follow-up,  She returns today for worsening headache since March 2020  Her headache is usually triggered by strong smells, such as perfume, cleaning agent, exertion, sudden head movement, her typical headache left parietal retro-orbital region  pounding headache with associated light, noise sensitivity, nauseous, ringing in ears, lasting for few hours, she also complains of intermittent left face, left finger paresthesia  She is having migraine headaches 3-4 times each week, tried Imitrex without significant improvement,  Update October 26, 2019, She is doing well, still has intermittent left lower face numbness, sometimes upper extremity paresthesia, only lasting for few minutes, no weakness,  She has migraine headaches every couple months, responding well to Imitrex  Personally reviewed MRI of the brain in September 2020, partially empty sella, no intracranial abnormality    REVIEW OF SYSTEMS: Full 14 system review of systems performed and notable only for as above All other review of systems were negative.  ALLERGIES: Allergies  Allergen Reactions  . Other Swelling    cherries    HOME MEDICATIONS: Current Outpatient Medications  Medication Sig Dispense Refill  . cetirizine (ZYRTEC) 10 MG chewable tablet Chew 10 mg by mouth as needed.     . flecainide (TAMBOCOR) 50 MG tablet Take 1 tablet (50 mg total) by mouth 2 (two) times daily. 180 tablet 2  . levothyroxine (SYNTHROID, LEVOTHROID) 75 MCG tablet Take 75 mcg by mouth daily before breakfast.    . metoprolol succinate (TOPROL-XL) 25 MG 24 hr tablet TAKE 1 TABLET BY MOUTH  DAILY (Patient taking differently: 12.5 mg. ) 90 tablet 1  . Multiple Vitamins-Minerals (MULTIVITAMIN ADULT PO) Take 1 tablet by mouth daily.     . nortriptyline (PAMELOR) 10 MG capsule Take 2 capsules (20 mg total) by mouth at bedtime. 60 capsule 11  . SUMAtriptan (IMITREX) 50 MG tablet Take 1 tablet (50 mg total) by mouth every 2 (two) hours as needed  for migraine. May repeat in 2 hours if headache persists or recurs. 12 tablet 11   No current facility-administered medications for this visit.     PAST MEDICAL HISTORY: Past Medical History:  Diagnosis Date  . Anemia   . Asthma   . Atrial  fibrillation with rapid ventricular response ([place]) 01/12/2019  . Bradycardia 08/13/2016  . Headache   . HSV infection   . Hypothyroidism   . Iron deficiency   . Migraine   . Tachycardia    intermittent tachycardia  . Tingling   . Vaginal Pap smear, abnormal    colposcopy   . Vitamin D deficiency     PAST SURGICAL HISTORY: Past Surgical History:  Procedure Laterality Date  . CESAREAN SECTION N/A 04/21/2015   Procedure: CESAREAN SECTION;  Surgeon: Everett Graff, MD;  Location: [facility];  Service: Obstetrics;  Laterality: N/A;  . removal of polyp from uterus      FAMILY HISTORY: Family History  Problem Relation Age of Onset  . Hypertension Father   . Hypercholesterolemia Mother   . Sarcoidosis Mother   . Asthma Brother   . Sickle cell trait Brother   . Breast cancer Neg Hx     SOCIAL HISTORY: Social History   Socioeconomic History  . Marital status: Single    Spouse name: Not on file  . Number of children: 1  . Years of education: BS  . Highest education level: Not on file  Occupational History  . Occupation: Garment/textile technologist  Social Needs  . Financial resource strain: Not on file  . Food insecurity    Worry: Not on file    Inability: Not on file  . Transportation needs    Medical: Not on file    Non-medical: Not on file  Tobacco Use  . Smoking status: Never Smoker  . Smokeless tobacco: Never Used  Substance and Sexual Activity  . Alcohol use: Yes    Comment: occasional  . Drug use: No  . Sexual activity: Yes  Lifestyle  . Physical activity    Days per week: Not on file    Minutes per session: Not on file  . Stress: Not on file  Relationships  . Social Herbalist on phone: Not on file    Gets together: Not on file    Attends religious service: Not on file    Active member of club or organization: Not on file    Attends meetings of clubs or organizations: Not on file    Relationship status: Not on file  . Intimate partner violence     Fear of current or ex partner: Not on file    Emotionally abused: Not on file    Physically abused: Not on file    Forced sexual activity: Not on file  Other Topics Concern  . Not on file  Social History Narrative   Lives at home with her mother and son.   Right-handed.   Occasional soda.     PHYSICAL EXAM   Vitals:   10/26/19 0752  BP: 118/62  Pulse: 62  Temp: (!) 97.2 F (36.2 C)  SpO2: 99%  Weight: 189 lb 3.2 oz (85.8 kg)  Height: 5\' 7"  (1.702 m)    Not recorded      Body mass index is 29.63 kg/m.  PHYSICAL EXAMNIATION:  Gen: NAD, conversant, well nourised, obese, well groomed  Cardiovascular: Regular rate rhythm, no peripheral edema, warm, nontender. Eyes: Conjunctivae clear without exudates or hemorrhage Neck: Supple, no carotid bruits. Pulmonary: Clear to auscultation bilaterally   NEUROLOGICAL EXAM:  MENTAL STATUS: Speech:    Speech is normal; fluent and spontaneous with normal comprehension.  Cognition:     Orientation to time, place and person     Normal recent and remote memory     Normal Attention span and concentration     Normal Language, naming, repeating,spontaneous speech     Fund of knowledge   CRANIAL NERVES: CN II: Visual fields are full to confrontation pupils are round equal and briskly reactive to light. CN III, IV, VI: extraocular movement are normal. No ptosis. CN V: Facial sensation is intact to pinprick in all 3 divisions bilaterally. Corneal responses are intact.  CN VII: Face is symmetric with normal eye closure and smile. CN VIII: Hearing is normal to rubbing fingers CN IX, X: Palate elevates symmetrically. Phonation is normal. CN XI: Head turning and shoulder shrug are intact CN XII: Tongue is midline with normal movements and no atrophy.  MOTOR: There is no pronator drift of out-stretched arms. Muscle bulk and tone are normal. Muscle strength is normal.  REFLEXES: Reflexes are 2+ and symmetric at the  biceps, triceps, knees, and ankles. Plantar responses are flexor.  SENSORY: Intact to light touch, pinprick, positional sensation and vibratory sensation are intact in fingers and toes.  COORDINATION: Rapid alternating movements and fine finger movements are intact. There is no dysmetria on finger-to-nose and heel-knee-shin.    GAIT/STANCE: Posture is normal. Gait is steady with normal steps, base, arm swing, and turning. Heel and toe walking are normal. Tandem gait is normal.  Romberg is absent.   DIAGNOSTIC DATA (LABS, IMAGING, TESTING) - I reviewed patient records, labs, notes, testing and imaging myself where available.   ASSESSMENT AND PLAN  Jecenia Seavey Dow is a 43 y.o. female   Worsening headaches Worsening left side paresthesia  MRI of the brain showed no significant abnormality  Continue nortriptyline 10mg  titrating to 20 mg every night as preventive medication  Imitrex 50 mg as needed   Marcial Pacas, M.D. Ph.D.  [facility] [address] [place], [place] [address] Ph: [phone] Fax: [phone]  CC: Donald Prose, MD

## 2019-11-13 ENCOUNTER — Other Ambulatory Visit: Payer: Self-pay

## 2019-11-13 ENCOUNTER — Ambulatory Visit: Payer: Commercial Managed Care - PPO | Admitting: Neurology

## 2019-11-13 ENCOUNTER — Encounter: Payer: Self-pay | Admitting: Neurology

## 2019-11-13 VITALS — BP 118/72 | HR 56 | Temp 96.9°F | Ht 67.0 in | Wt 188.0 lb

## 2019-11-13 DIAGNOSIS — R202 Paresthesia of skin: Secondary | ICD-10-CM

## 2019-11-13 DIAGNOSIS — R519 Headache, unspecified: Secondary | ICD-10-CM

## 2019-11-13 NOTE — Patient Instructions (Signed)
Continue current medications, no changes.  Return in 6 months 

## 2019-11-13 NOTE — Progress Notes (Signed)
PATIENT: Janice Flores DOB: [birth year]  REASON FOR VISIT: follow up HISTORY FROM: patient  HISTORY OF PRESENT ILLNESS: Today 11/13/19  HISTORY Janice Iseri Crumdyis a 43 year old right-handed female alone at today's clinical visit, seen in refer by her primary care physician Dr.Vyvyan Sun for evaluation of headaches on August 10, 2019, I saw her previously on November 06 2015 for migraine  I have reviewed and summarize her most recent office note, she had a history of hypothyroidism, intermittent asthma, iron deficiency anemia, vitamin D deficiency, idiopathic scoliosis of lumbar region, history of herpes simplex type II, PVCs  She started to have headaches since 2013, she reported sudden onset transient sharp headache lasting for few seconds with sudden positional change, for a while, it has improved, but since October 2016, she began to have frequent occurrence, multiple episodes in a day, usually happened with sudden positional change, turning her head, bending over, getting up from bed  In addition she also had a history of migraine headaches, above-mentioned headache is different from her typical migraine, her typical migraine left frontal behind eye severe pounding headache with associated light noise sensitivity, nauseous, lasting for 1 day,   She is currently still nursing her 91-month-old son, tried Tylenol for migraine with limited help, she was started on preventive medication magnesium oxide, riboflavin, which has been helpful, 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, no evidence of compression, headache was much improved, has lost follow-up,  She returns today for worsening headache since March 2020  Her headache is usually triggered by strong smells, such as perfume, cleaning agent, exertion, sudden head movement, her typical headache left parietal retro-orbital region pounding headache with associated light, noise sensitivity, nauseous, ringing in  ears, lasting for few hours, she also complains of intermittent left face, left finger paresthesia  She is having migraine headaches 3-4 times each week, tried Imitrex without significant improvement,  Update October 26, 2019, She is doing well, still has intermittent left lower face numbness, sometimes upper extremity paresthesia, only lasting for few minutes, no weakness,  She has migraine headaches every couple months, responding well to Imitrex  Personally reviewed MRI of the brain in September 2020, partially empty sella, no intracranial abnormality   Update November 13, 2019 SS: She was recently seen by Dr. Krista Blue 3 weeks ago.  She reports she has continued to do well, has had less frequent paresthesia and headaches.  Has had only had 2 migraines in September, reports excellent benefit with Imitrex.  She is working with cardiology to decrease her dose of metoprolol.  She thinks the decreased dose has resulted in less episodes of intermittent left lower face numbness, paresthesia to left upper extremity.  She is now taking metoprolol 12.5 mg daily.  The frequency of paresthesias is happening less than once a week, is much improved.  She presents today for evaluation unaccompanied.  She has not actually started nortriptyline, has not needed up to this point.  REVIEW OF SYSTEMS: Out of a complete 14 system review of symptoms, the patient complains only of the following symptoms, and all other reviewed systems are negative.  Headache, numbness   ALLERGIES: Allergies  Allergen Reactions  . Other Swelling    cherries    HOME MEDICATIONS: Outpatient Medications Prior to Visit  Medication Sig Dispense Refill  . cetirizine (ZYRTEC) 10 MG chewable tablet Chew 10 mg by mouth as needed.     . flecainide (TAMBOCOR) 50 MG tablet Take 1 tablet (50 mg total) by  mouth 2 (two) times daily. 180 tablet 2  . levothyroxine (SYNTHROID, LEVOTHROID) 75 MCG tablet Take 75 mcg by mouth daily before  breakfast.    . metoprolol succinate (TOPROL-XL) 25 MG 24 hr tablet TAKE 1 TABLET BY MOUTH  DAILY (Patient taking differently: 12.5 mg. ) 90 tablet 1  . Multiple Vitamins-Minerals (MULTIVITAMIN ADULT PO) Take 1 tablet by mouth daily.     . SUMAtriptan (IMITREX) 50 MG tablet Take 1 tablet (50 mg total) by mouth every 2 (two) hours as needed for migraine. May repeat in 2 hours if headache persists or recurs. 12 tablet 11   No facility-administered medications prior to visit.     PAST MEDICAL HISTORY: Past Medical History:  Diagnosis Date  . Anemia   . Asthma   . Atrial fibrillation with rapid ventricular response ([place]) 01/12/2019  . Bradycardia 08/13/2016  . Headache   . HSV infection   . Hypothyroidism   . Iron deficiency   . Migraine   . Tachycardia    intermittent tachycardia  . Tingling   . Vaginal Pap smear, abnormal    colposcopy   . Vitamin D deficiency     PAST SURGICAL HISTORY: Past Surgical History:  Procedure Laterality Date  . CESAREAN SECTION N/A 04/21/2015   Procedure: CESAREAN SECTION;  Surgeon: Everett Graff, MD;  Location: [facility];  Service: Obstetrics;  Laterality: N/A;  . removal of polyp from uterus      FAMILY HISTORY: Family History  Problem Relation Age of Onset  . Hypertension Father   . Hypercholesterolemia Mother   . Sarcoidosis Mother   . Asthma Brother   . Sickle cell trait Brother   . Breast cancer Neg Hx     SOCIAL HISTORY: Social History   Socioeconomic History  . Marital status: Single    Spouse name: Not on file  . Number of children: 1  . Years of education: BS  . Highest education level: Not on file  Occupational History  . Occupation: Garment/textile technologist  Social Needs  . Financial resource strain: Not on file  . Food insecurity    Worry: Not on file    Inability: Not on file  . Transportation needs    Medical: Not on file    Non-medical: Not on file  Tobacco Use  . Smoking status: Never Smoker  . Smokeless tobacco:  Never Used  Substance and Sexual Activity  . Alcohol use: Yes    Comment: occasional  . Drug use: No  . Sexual activity: Yes  Lifestyle  . Physical activity    Days per week: Not on file    Minutes per session: Not on file  . Stress: Not on file  Relationships  . Social Herbalist on phone: Not on file    Gets together: Not on file    Attends religious service: Not on file    Active member of club or organization: Not on file    Attends meetings of clubs or organizations: Not on file    Relationship status: Not on file  . Intimate partner violence    Fear of current or ex partner: Not on file    Emotionally abused: Not on file    Physically abused: Not on file    Forced sexual activity: Not on file  Other Topics Concern  . Not on file  Social History Narrative   Lives at home with her mother and son.   Right-handed.   Occasional soda.  PHYSICAL EXAM  Vitals:   11/13/19 0903  BP: 118/72  Pulse: (!) 56  Temp: (!) 96.9 F (36.1 C)  TempSrc: Oral  Weight: 188 lb (85.3 kg)  Height: 5\' 7"  (1.702 m)   Body mass index is 29.44 kg/m.  Generalized: Well developed, in no acute distress   Neurological examination  Mentation: Alert oriented to time, place, history taking. Follows all commands speech and language fluent Cranial nerve II-XII: Pupils were equal round reactive to light. Extraocular movements were full, visual field were full on confrontational test. Facial sensation and strength were normal. Head turning and shoulder shrug  were normal and symmetric. Motor: The motor testing reveals 5 over 5 strength of all 4 extremities. Good symmetric motor tone is noted throughout.  Sensory: Sensory testing is intact to soft touch on all 4 extremities. No evidence of extinction is noted.  Coordination: Cerebellar testing reveals good finger-nose-finger and heel-to-shin bilaterally.  Gait and station: Gait is normal. Tandem gait is normal.  Reflexes: Deep  tendon reflexes are symmetric and normal bilaterally.   DIAGNOSTIC DATA (LABS, IMAGING, TESTING) - I reviewed patient records, labs, notes, testing and imaging myself where available.  Lab Results  Component Value Date   WBC 4.2 04/03/2019   HGB 12.9 04/03/2019   HCT 40.3 04/03/2019   MCV 92.9 04/03/2019   PLT 370 04/03/2019      Component Value Date/Time   NA 136 04/03/2019 1851   NA 137 04/05/2017 1034   K 4.2 04/03/2019 1851   CL 104 04/03/2019 1851   CO2 24 04/03/2019 1851   GLUCOSE 87 04/03/2019 1851   BUN 15 04/03/2019 1851   BUN 9 04/05/2017 1034   CREATININE 0.82 04/03/2019 1851   CALCIUM 9.1 04/03/2019 1851   PROT 8.1 01/11/2019 1127   PROT 7.6 04/05/2017 1034   ALBUMIN 4.3 01/11/2019 1127   ALBUMIN 4.6 04/05/2017 1034   AST 13 (L) 01/11/2019 1127   ALT 11 01/11/2019 1127   ALKPHOS 34 (L) 01/11/2019 1127   BILITOT 1.0 01/11/2019 1127   BILITOT 1.3 (H) 04/05/2017 1034   GFRNONAA >60 04/03/2019 1851   GFRAA >60 04/03/2019 1851   No results found for: CHOL, HDL, LDLCALC, LDLDIRECT, TRIG, CHOLHDL No results found for: HGBA1C No results found for: VITAMINB12 Lab Results  Component Value Date   TSH 3.170 01/12/2019    ASSESSMENT AND PLAN 43 y.o. year old female  has a past medical history of Anemia, Asthma, Atrial fibrillation with rapid ventricular response ([place]) (01/12/2019), Bradycardia (08/13/2016), Headache, HSV infection, Hypothyroidism, Iron deficiency, Migraine, Tachycardia, Tingling, Vaginal Pap smear, abnormal, and Vitamin D deficiency. here with:  1.  Worsening headaches 2.  Worsening left side paresthesia -Continues to do well, only 2 migraines since September, less than 1 episode of paresthesia weekly, improved with dose reduction of metoprolol -MRI of the brain showed no significant abnormality -Continue Imitrex 50 mg as needed, continue nortriptyline 20 mg at bedtime, to keep on hand, has not needed up to this point -Follow-up in 6 months or sooner  if needed  I spent 15 minutes with the patient. 50% of this time was spent discussing the plan of care.  Butler Denmark, AGNP-C, DNP 11/13/2019, 9:16 AM [facility] [address] [place], [place] [address] [phone]

## 2019-11-20 ENCOUNTER — Ambulatory Visit (HOSPITAL_COMMUNITY)
Admission: RE | Admit: 2019-11-20 | Discharge: 2019-11-20 | Disposition: A | Discharge status: Home / Self Care | Payer: Commercial Managed Care - PPO | Source: Ambulatory Visit | Attending: Physician Assistant | Admitting: Physician Assistant

## 2019-11-20 ENCOUNTER — Other Ambulatory Visit: Payer: Self-pay

## 2019-11-20 ENCOUNTER — Telehealth: Payer: Self-pay | Admitting: Cardiology

## 2019-11-20 VITALS — BP 120/78 | HR 54 | Ht 67.0 in | Wt 185.0 lb

## 2019-11-20 DIAGNOSIS — Z79899 Other long term (current) drug therapy: Secondary | ICD-10-CM | POA: Diagnosis not present

## 2019-11-20 DIAGNOSIS — I4891 Unspecified atrial fibrillation: Secondary | ICD-10-CM

## 2019-11-20 DIAGNOSIS — E039 Hypothyroidism, unspecified: Secondary | ICD-10-CM | POA: Diagnosis not present

## 2019-11-20 DIAGNOSIS — R0789 Other chest pain: Secondary | ICD-10-CM | POA: Insufficient documentation

## 2019-11-20 DIAGNOSIS — I480 Paroxysmal atrial fibrillation: Secondary | ICD-10-CM | POA: Diagnosis present

## 2019-11-20 DIAGNOSIS — D509 Iron deficiency anemia, unspecified: Secondary | ICD-10-CM | POA: Insufficient documentation

## 2019-11-20 DIAGNOSIS — Z7901 Long term (current) use of anticoagulants: Secondary | ICD-10-CM | POA: Diagnosis not present

## 2019-11-20 DIAGNOSIS — I4892 Unspecified atrial flutter: Secondary | ICD-10-CM | POA: Insufficient documentation

## 2019-11-20 NOTE — Telephone Encounter (Signed)
Spoke with patient who reports she has been having SOB and chest tightness off and on for about 1 week.  She first noticed this last Sunday when she got up to fix breakfast.  She reports feeling out of breath and went back to sit down.  This helped with the s/s.  Her s/s did not fell like her At Fib like in the past but she does report a heart rate of 120-130 bpm that day.  Since then she has have some "chest pain" but no SOB.  She thought maybe what she was feeling was indigestion and took TUMS which did not seem to help.  Yesterday her HR was up to 149 bpm at one point.  She states she just rested and laid around yesterday.  She can develop the symptoms just sitting sit.  She denies any nausea, vomiting or diaphoresis.   She reports her normal HR is in the 50-60 range though recently it has ranged from in the 40's to 149 bpm per her phone app.  She does not have any BP reading.  She has been taking her medications as listed. Recent Coronary CT demonstrated a Ca score of 0, no evidence of CAD (05/10/2019).  Advise since no active chest pain, tightness or SOB, will forward to Dr Radford Pax for review and any orders.  Reassurance given.

## 2019-11-20 NOTE — Telephone Encounter (Signed)
Please see if you can get her into afib clinic today

## 2019-11-20 NOTE — Telephone Encounter (Signed)
Pt c/o of Chest Pain: STAT if CP now or developed within 24 hours  1. Are you having CP right now? Not bad  2. Are you experiencing any other symptoms (ex. SOB, nausea, vomiting, sweating)? Not today, SOB  3. How long have you been experiencing CP? 1 week  4. Is your CP continuous or coming and going? Comes and goes  5. Have you taken Nitroglycerin? no ?

## 2019-11-20 NOTE — Telephone Encounter (Signed)
Pt has been scheduled in the At Fib clinic as requested by Dr Radford Pax.  Pt is aware.

## 2019-11-20 NOTE — Progress Notes (Signed)
Primary Care Physician: Donald Prose, MD Primary Cardiologist: Dr Radford Pax Primary Electrophysiologist: none Referring Physician: Dr Maren Beach Janice Flores is a 43 y.o. female with a history of asthma, hypothyroidism, and paroxysmal atrial fibrillation who presents for follow up in the [facility]. Patient reports that in February she woke in the middle of the night with palpations and SOB. She presented to the ER and was found to be in afib with RVR. She was given metoprolol on discharge and patient reports she spontaneously converted to SR later that day. She had another episode of heart racing on 04/03/19 and again went to the ER and was found to be in atrial flutter with variable conduction. Cardioversion was not pursued at that time. Patient feels that she has been persistently out of rhythm since her ER visit with nearly constant symptoms of palpitations, periodic lightheadedness, and SOB on exertion. Her Apple Watch has shown labile HR up to 140s. She denies snoring or significant alcohol use. There were no triggers during each of these episodes that the patient could identify. She was started on flecainide on 05/22/19.  On follow up today, patient reports that over the last week she has had episodes of chest discomfort which last about two hours. The episodes are not related to exertion and the pain does not radiate. There are no other associated symptoms. She has followed her heart rate which has been variable from the 40s to 160s.   Today, she denies symptoms of palpitations, shortness of breath, orthopnea, PND, lower extremity edema, dizziness, presyncope, syncope, snoring, daytime somnolence, bleeding, or neurologic sequela. The patient is tolerating medications without difficulties and is otherwise without complaint today.    Atrial Fibrillation Risk Factors:  she does not have symptoms or diagnosis of sleep apnea. she does not have a history of  rheumatic fever. she does not have a history of alcohol use. The patient does have a history of early familial atrial fibrillation or other arrhythmias. Mother has afib.  she has a BMI of Body mass index is 28.98 kg/m.Marland Kitchen Filed Weights   11/20/19 1428  Weight: 83.9 kg    Family History  Problem Relation Age of Onset  . Hypertension Father   . Hypercholesterolemia Mother   . Sarcoidosis Mother   . Asthma Brother   . Sickle cell trait Brother   . Breast cancer Neg Hx      Atrial Fibrillation Management history:  Previous antiarrhythmic drugs: flecainide Previous cardioversions: none Previous ablations: none CHADS2VASC score: 1 Anticoagulation history: Eliquis   Past Medical History:  Diagnosis Date  . Anemia   . Asthma   . Atrial fibrillation with rapid ventricular response ([place]) 01/12/2019  . Bradycardia 08/13/2016  . Headache   . HSV infection   . Hypothyroidism   . Iron deficiency   . Migraine   . Tachycardia    intermittent tachycardia  . Tingling   . Vaginal Pap smear, abnormal    colposcopy   . Vitamin D deficiency    Past Surgical History:  Procedure Laterality Date  . CESAREAN SECTION N/A 04/21/2015   Procedure: CESAREAN SECTION;  Surgeon: Everett Graff, MD;  Location: [facility];  Service: Obstetrics;  Laterality: N/A;  . removal of polyp from uterus      Current Outpatient Medications  Medication Sig Dispense Refill  . cetirizine (ZYRTEC) 10 MG chewable tablet Chew 10 mg by mouth as needed.     . flecainide (TAMBOCOR) 50 MG tablet  Take 1 tablet (50 mg total) by mouth 2 (two) times daily. 180 tablet 2  . levothyroxine (SYNTHROID, LEVOTHROID) 75 MCG tablet Take 75 mcg by mouth daily before breakfast.    . metoprolol succinate (TOPROL-XL) 25 MG 24 hr tablet TAKE 1 TABLET BY MOUTH  DAILY (Patient taking differently: 12.5 mg daily. ) 90 tablet 1  . Multiple Vitamins-Minerals (MULTIVITAMIN ADULT PO) Take 1 tablet by mouth daily.     . SUMAtriptan (IMITREX) 50 MG  tablet Take 1 tablet (50 mg total) by mouth every 2 (two) hours as needed for migraine. May repeat in 2 hours if headache persists or recurs. 12 tablet 11   No current facility-administered medications for this encounter.    Allergies  Allergen Reactions  . Other Swelling    cherries    Social History   Socioeconomic History  . Marital status: Single    Spouse name: Not on file  . Number of children: 1  . Years of education: BS  . Highest education level: Not on file  Occupational History  . Occupation: Garment/textile technologist  Tobacco Use  . Smoking status: Never Smoker  . Smokeless tobacco: Never Used  Substance and Sexual Activity  . Alcohol use: Yes    Comment: occasional  . Drug use: No  . Sexual activity: Yes  Other Topics Concern  . Not on file  Social History Narrative   Lives at home with her mother and son.   Right-handed.   Occasional soda.   Social Determinants of Health   Financial Resource Strain:   . Difficulty of Paying Living Expenses: Not on file  Food Insecurity:   . Worried About Charity fundraiser in the Last Year: Not on file  . Ran Out of Food in the Last Year: Not on file  Transportation Needs:   . Lack of Transportation (Medical): Not on file  . Lack of Transportation (Non-Medical): Not on file  Physical Activity:   . Days of Exercise per Week: Not on file  . Minutes of Exercise per Session: Not on file  Stress:   . Feeling of Stress : Not on file  Social Connections:   . Frequency of Communication with Friends and Family: Not on file  . Frequency of Social Gatherings with Friends and Family: Not on file  . Attends Religious Services: Not on file  . Active Member of Clubs or Organizations: Not on file  . Attends Archivist Meetings: Not on file  . Marital Status: Not on file  Intimate Partner Violence:   . Fear of Current or Ex-Partner: Not on file  . Emotionally Abused: Not on file  . Physically Abused: Not on file  .  Sexually Abused: Not on file     ROS- All systems are reviewed and negative except as per the HPI above.  Physical Exam: Vitals:   11/20/19 1428  BP: 120/78  Pulse: (!) 54  Weight: 83.9 kg  Height: 5\' 7"  (1.702 m)    GEN- The patient is well appearing, alert and oriented x 3 today.   HEENT-head normocephalic, atraumatic, sclera clear, conjunctiva pink, hearing intact, trachea midline. Lungs- Clear to ausculation bilaterally, normal work of breathing Heart- Regular rate and rhythm, no murmurs, rubs or gallops  GI- soft, NT, ND, + BS Extremities- no clubbing, cyanosis, or edema MS- no significant deformity or atrophy Skin- no rash or lesion Psych- euthymic mood, full affect Neuro- strength and sensation are intact   Wt Readings from  Last 3 Encounters:  11/20/19 83.9 kg  11/13/19 85.3 kg  10/26/19 85.8 kg    EKG today demonstrates SB HR 54, PR 178, QRS 92, QTc 398  Echo 01/18/19 demonstrated  1. The left ventricle has hyperdynamic systolic function, with an ejection fraction of >65%. The cavity size was normal. Left ventricular diastolic parameters were normal.  2. The right ventricle has normal systolic function. The cavity was normal. There is no increase in right ventricular wall thickness.  3. The mitral valve is normal in structure.  4. The tricuspid valve is normal in structure.  5. The aortic valve is normal in structure. no stenosis of the aortic valve.  6. The aortic root and ascending aorta are normal in size and structure.  7. The interatrial septum was not assessed.  Epic records are reviewed at length today  Assessment and Plan:  1. Paroxysmal atrial fibrillation/atrial flutter Patient having symptoms of chest discomfort with variable heart rates. ?afib related. Will order 7 day Zio patch to evaluate if symptoms and arrhythmia correlate. Will not make medication changes today given report of bradycardia.  Continue flecainide 50 mg BID.  Continue Toprol 25  mg daily. No anticoagulation indicated at this time. Lifestyle modification was discussed and encouraged including regular physical activity and weight reduction.  This patients CHA2DS2-VASc Score and unadjusted Ischemic Stroke Rate (% per year) is equal to 0.6 % stroke rate/year from a score of 1  Above score calculated as 1 point each if present [CHF, HTN, DM, Vascular=MI/PAD/Aortic Plaque, Age if 65-74, or Female] Above score calculated as 2 points each if present [Age > 75, or Stroke/TIA/TE]  2. Chest discomfort Low suspicion for ischemia given normal echo, TST, and calcium score of 0. Mostly atypical symptoms. See plans above.   Follow up with AF clinic in 3 weeks. Dr Radford Pax as scheduled.    [facility] [address] [place], [place] [address] [phone] 11/20/2019 4:57 PM

## 2019-11-22 ENCOUNTER — Ambulatory Visit: Payer: Commercial Managed Care - PPO | Admitting: Cardiology

## 2019-11-22 NOTE — Progress Notes (Signed)
I have reviewed and agreed above plan. 

## 2019-12-13 ENCOUNTER — Ambulatory Visit (HOSPITAL_COMMUNITY)
Admission: RE | Admit: 2019-12-13 | Discharge: 2019-12-13 | Disposition: A | Discharge status: Home / Self Care | Payer: Managed Care, Other (non HMO) | Source: Ambulatory Visit | Attending: Physician Assistant | Admitting: Physician Assistant

## 2019-12-13 ENCOUNTER — Other Ambulatory Visit: Payer: Self-pay

## 2019-12-13 VITALS — BP 120/66 | HR 59 | Ht 67.0 in | Wt 190.6 lb

## 2019-12-13 DIAGNOSIS — Z7901 Long term (current) use of anticoagulants: Secondary | ICD-10-CM | POA: Diagnosis not present

## 2019-12-13 DIAGNOSIS — Z8249 Family history of ischemic heart disease and other diseases of the circulatory system: Secondary | ICD-10-CM | POA: Insufficient documentation

## 2019-12-13 DIAGNOSIS — Z79899 Other long term (current) drug therapy: Secondary | ICD-10-CM | POA: Insufficient documentation

## 2019-12-13 DIAGNOSIS — G43909 Migraine, unspecified, not intractable, without status migrainosus: Secondary | ICD-10-CM | POA: Diagnosis not present

## 2019-12-13 DIAGNOSIS — I480 Paroxysmal atrial fibrillation: Secondary | ICD-10-CM

## 2019-12-13 DIAGNOSIS — E559 Vitamin D deficiency, unspecified: Secondary | ICD-10-CM | POA: Diagnosis not present

## 2019-12-13 DIAGNOSIS — J45909 Unspecified asthma, uncomplicated: Secondary | ICD-10-CM | POA: Insufficient documentation

## 2019-12-13 DIAGNOSIS — E039 Hypothyroidism, unspecified: Secondary | ICD-10-CM | POA: Insufficient documentation

## 2019-12-13 NOTE — Progress Notes (Signed)
Primary Care Physician: Donald Prose, MD Primary Cardiologist: Dr Radford Pax Primary Electrophysiologist: none Referring Physician: Dr Maren Beach Janice Flores is a 44 y.o. female with a history of asthma, hypothyroidism, and paroxysmal atrial fibrillation who presents for follow up in the [facility]. Patient reports that in February she woke in the middle of the night with palpations and SOB. She presented to the ER and was found to be in afib with RVR. She was given metoprolol on discharge and patient reports she spontaneously converted to SR later that day. She had another episode of heart racing on 04/03/19 and again went to the ER and was found to be in atrial flutter with variable conduction. Cardioversion was not pursued at that time. Patient feels that she has been persistently out of rhythm since her ER visit with nearly constant symptoms of palpitations, periodic lightheadedness, and SOB on exertion. Her Apple Watch has shown labile HR up to 140s. She denies snoring or significant alcohol use. There were no triggers during each of these episodes that the patient could identify. She was started on flecainide on 05/22/19.  On follow up today, patient reports doing well since her last visit. She completed her heart monitor but the results are not available to me today. She states that her symptoms of chest tightness are about the same, lasting 1-2 minutes. They do not occur every day. She checked her Apple Watch at the time of her symptoms and her heart rate was well controlled in the upper 50s.   Today, she denies symptoms of shortness of breath, orthopnea, PND, lower extremity edema, dizziness, presyncope, syncope, snoring, daytime somnolence, bleeding, or neurologic sequela. The patient is tolerating medications without difficulties and is otherwise without complaint today.    Atrial Fibrillation Risk Factors:  she does not have symptoms or diagnosis of  sleep apnea. she does not have a history of rheumatic fever. she does not have a history of alcohol use. The patient does have a history of early familial atrial fibrillation or other arrhythmias. Mother has afib.  she has a BMI of Body mass index is 29.85 kg/m.Marland Kitchen Filed Weights   12/13/19 0945  Weight: 86.5 kg    Family History  Problem Relation Age of Onset  . Hypertension Father   . Hypercholesterolemia Mother   . Sarcoidosis Mother   . Asthma Brother   . Sickle cell trait Brother   . Breast cancer Neg Hx      Atrial Fibrillation Management history:  Previous antiarrhythmic drugs: flecainide Previous cardioversions: none Previous ablations: none CHADS2VASC score: 1 Anticoagulation history: Eliquis   Past Medical History:  Diagnosis Date  . Anemia   . Asthma   . Atrial fibrillation with rapid ventricular response ([place]) 01/12/2019  . Bradycardia 08/13/2016  . Headache   . HSV infection   . Hypothyroidism   . Iron deficiency   . Migraine   . Tachycardia    intermittent tachycardia  . Tingling   . Vaginal Pap smear, abnormal    colposcopy   . Vitamin D deficiency    Past Surgical History:  Procedure Laterality Date  . CESAREAN SECTION N/A 04/21/2015   Procedure: CESAREAN SECTION;  Surgeon: Everett Graff, MD;  Location: [facility];  Service: Obstetrics;  Laterality: N/A;  . removal of polyp from uterus      Current Outpatient Medications  Medication Sig Dispense Refill  . Ascorbic Acid (VITAMIN C) 1000 MG tablet Take 2,000 mg by mouth  daily.    . cetirizine (ZYRTEC) 10 MG chewable tablet Chew 10 mg by mouth as needed.     . Cholecalciferol (VITAMIN D-3 PO) Take by mouth daily. Taking 1000mg  daily    . ferrous sulfate 325 (65 FE) MG EC tablet Take 325 mg by mouth daily. Taking one tablet daily    . flecainide (TAMBOCOR) 50 MG tablet Take 1 tablet (50 mg total) by mouth 2 (two) times daily. 180 tablet 2  . levothyroxine (SYNTHROID, LEVOTHROID) 75 MCG tablet Take 75  mcg by mouth daily before breakfast.    . metoprolol succinate (TOPROL-XL) 25 MG 24 hr tablet TAKE 1 TABLET BY MOUTH  DAILY (Patient taking differently: 12.5 mg daily. ) 90 tablet 1  . Misc Natural Products (TUMERSAID PO) Take by mouth. Taking one gummy daily- not sure the dosage    . Multiple Vitamins-Minerals (MULTIVITAMIN ADULT PO) Take 1 tablet by mouth daily.     . SUMAtriptan (IMITREX) 50 MG tablet Take 1 tablet (50 mg total) by mouth every 2 (two) hours as needed for migraine. May repeat in 2 hours if headache persists or recurs. 12 tablet 11   No current facility-administered medications for this encounter.    Allergies  Allergen Reactions  . Other Swelling    cherries    Social History   Socioeconomic History  . Marital status: Single    Spouse name: Not on file  . Number of children: 1  . Years of education: BS  . Highest education level: Not on file  Occupational History  . Occupation: Garment/textile technologist  Tobacco Use  . Smoking status: Never Smoker  . Smokeless tobacco: Never Used  Substance and Sexual Activity  . Alcohol use: Yes    Comment: occasional  . Drug use: No  . Sexual activity: Yes  Other Topics Concern  . Not on file  Social History Narrative   Lives at home with her mother and son.   Right-handed.   Occasional soda.   Social Determinants of Health   Financial Resource Strain:   . Difficulty of Paying Living Expenses: Not on file  Food Insecurity:   . Worried About Charity fundraiser in the Last Year: Not on file  . Ran Out of Food in the Last Year: Not on file  Transportation Needs:   . Lack of Transportation (Medical): Not on file  . Lack of Transportation (Non-Medical): Not on file  Physical Activity:   . Days of Exercise per Week: Not on file  . Minutes of Exercise per Session: Not on file  Stress:   . Feeling of Stress : Not on file  Social Connections:   . Frequency of Communication with Friends and Family: Not on file  .  Frequency of Social Gatherings with Friends and Family: Not on file  . Attends Religious Services: Not on file  . Active Member of Clubs or Organizations: Not on file  . Attends Archivist Meetings: Not on file  . Marital Status: Not on file  Intimate Partner Violence:   . Fear of Current or Ex-Partner: Not on file  . Emotionally Abused: Not on file  . Physically Abused: Not on file  . Sexually Abused: Not on file     ROS- All systems are reviewed and negative except as per the HPI above.  Physical Exam: Vitals:   12/13/19 0945  BP: 120/66  Pulse: (!) 59  Weight: 86.5 kg  Height: 5\' 7"  (1.702 m)  GEN- The patient is well appearing, alert and oriented x 3 today.   HEENT-head normocephalic, atraumatic, sclera clear, conjunctiva pink, hearing intact, trachea midline. Lungs- Clear to ausculation bilaterally, normal work of breathing Heart- Regular rate and rhythm, no murmurs, rubs or gallops  GI- soft, NT, ND, + BS Extremities- no clubbing, cyanosis, or edema MS- no significant deformity or atrophy Skin- no rash or lesion Psych- euthymic mood, full affect Neuro- strength and sensation are intact   Wt Readings from Last 3 Encounters:  12/13/19 86.5 kg  11/20/19 83.9 kg  11/13/19 85.3 kg    EKG today demonstrates SB HR 59, PR 170, QRS 100, QTc 427  Echo 01/18/19 demonstrated  1. The left ventricle has hyperdynamic systolic function, with an ejection fraction of >65%. The cavity size was normal. Left ventricular diastolic parameters were normal.  2. The right ventricle has normal systolic function. The cavity was normal. There is no increase in right ventricular wall thickness.  3. The mitral valve is normal in structure.  4. The tricuspid valve is normal in structure.  5. The aortic valve is normal in structure. no stenosis of the aortic valve.  6. The aortic root and ascending aorta are normal in size and structure.  7. The interatrial septum was not  assessed.  Epic records are reviewed at length today  Assessment and Plan:  1. Paroxysmal atrial fibrillation/atrial flutter Zio patch results pending. Unclear if afib is related to her symptoms as her heart rates have been well controlled on her Apple Watch. Continue flecainide 50 mg BID.  Continue Toprol 12.5 mg daily. No anticoagulation indicated at this time. Lifestyle modification was discussed and encouraged including regular physical activity and weight reduction.  This patients CHA2DS2-VASc Score and unadjusted Ischemic Stroke Rate (% per year) is equal to 0.6 % stroke rate/year from a score of 1  Above score calculated as 1 point each if present [CHF, HTN, DM, Vascular=MI/PAD/Aortic Plaque, Age if 65-74, or Female] Above score calculated as 2 points each if present [Age > 75, or Stroke/TIA/TE]   Follow up with Dr Radford Pax as scheduled. AF clinic in 3 months pending monitor results.    [facility] [address] [place], [place] [address] [phone] 12/13/2019 10:34 AM

## 2019-12-14 ENCOUNTER — Ambulatory Visit
Admission: RE | Admit: 2019-12-14 | Discharge: 2019-12-14 | Disposition: A | Discharge status: Home / Self Care | Payer: Managed Care, Other (non HMO) | Source: Ambulatory Visit | Attending: Obstetrics and Gynecology | Admitting: Obstetrics and Gynecology

## 2019-12-14 DIAGNOSIS — Z1231 Encounter for screening mammogram for malignant neoplasm of breast: Secondary | ICD-10-CM

## 2019-12-17 NOTE — Progress Notes (Signed)
Cardiology Office Note:    Date:  12/18/2019   ID:  [name], DOB 02-Aug-1976, MRN [id number]  PCP:  Donald Prose, MD  Cardiologist:  Fransico Him, MD    Referring MD: Donald Prose, MD   Chief Complaint  Patient presents with  . Atrial Flutter  . Hypertension    History of Present Illness:    Janice Flores is a 44 y.o. female with a hx of asthma, anemia, chronic headaches, bradycardia, hypothyroidism and iron deficiency anemia.  She has a history of PVCs and recently was diagnosed with new onset atrial fibrillation with RVR. Her ChADS2VASC score is1 (female)and she was not anticoagulated. D-dimer was elevated but chest CTA showed no evidence of PE.   She converted on beta-blocker. When I last saw her in February she was encouraged not to drink any caffeine and avoid alcohol.  2D echo was done and was normal with EF greater than 65% and normal left atrial size.  She called in on 04/03/2019 stating that her heart rate was fluctuating while she was sitting and will go as high as 123 bpm.  She was seen in A. fib clinic on 04/05/2019 and was still complaining of palpitations.  Antiarrhythmic drug therapy was discussed with the patient and she wanted to pursue flecainide therapy.  Since she had been in atrial fibrillation more than 48 hours she was started on Eliquis 5 mg twice daily.  Plan was to start flecainide when she had been on Eliquis for 3 weeks without any missed doses.  Her Toprol-XL was increased to 50 mg daily for better rate control.  I saw her back in 04/11/2019 she had not started the Eliquis. A coronary CTA was done before initiating Eliquis showing a calcium score of 0 and no CAD.   She was last seen a week ago in afib clinic and was doing well.  She continued to have atypical CP lasting 1-2 minutes at a time.  She was continued on Flecainide 50mg  BID and Toprol.  She stopped Eliquis after 4 weeks of with no inidication at this time for anticoagulation long  term.    She is here today for followup and is doing well.  She continues to have intermittent sharp chest pain with associated SOB.  At times it feels like a knot in the center of her chest.  This nonexertional with no associated sx other than the SOB.  She says that the SOB is very prominent and she will all of a sudden be unable to catch her breath.  She does not think they are panic attacks.  She also is still having palpitations.  Her heart monitor showed rare PVCs, PACs and a 4 beat run of nonsustained atach but no afib.  She denies any  PND, orthopnea, LE edema, dizziness, or syncope. She is compliant with her meds and is tolerating meds with no SE.      Past Medical History:  Diagnosis Date  . Anemia   . Asthma   . Atrial fibrillation with rapid ventricular response ([place]) 01/12/2019  . Bradycardia 08/13/2016  . Headache   . HSV infection   . Hypothyroidism   . Iron deficiency   . Migraine   . Tachycardia    intermittent tachycardia  . Tingling   . Vaginal Pap smear, abnormal    colposcopy   . Vitamin D deficiency     Past Surgical History:  Procedure Laterality Date  . CESAREAN SECTION N/A 04/21/2015   Procedure:  CESAREAN SECTION;  Surgeon: Everett Graff, MD;  Location: [facility];  Service: Obstetrics;  Laterality: N/A;  . removal of polyp from uterus      Current Medications: Current Meds  Medication Sig  . Ascorbic Acid (VITAMIN C) 1000 MG tablet Take 2,000 mg by mouth daily.  . cetirizine (ZYRTEC) 10 MG chewable tablet Chew 10 mg by mouth as needed.   . Cholecalciferol (VITAMIN D-3 PO) Take by mouth daily. Taking 1000mg  daily  . ferrous sulfate 325 (65 FE) MG EC tablet Take 325 mg by mouth daily. Taking one tablet daily  . flecainide (TAMBOCOR) 50 MG tablet Take 1 tablet (50 mg total) by mouth 2 (two) times daily.  Marland Kitchen levothyroxine (SYNTHROID, LEVOTHROID) 75 MCG tablet Take 75 mcg by mouth daily before breakfast.  . metoprolol succinate (TOPROL-XL) 25 MG 24 hr tablet TAKE  1 TABLET BY MOUTH  DAILY (Patient taking differently: 12.5 mg daily. )  . Misc Natural Products (TUMERSAID PO) Take by mouth. Taking one gummy daily- not sure the dosage  . Multiple Vitamins-Minerals (MULTIVITAMIN ADULT PO) Take 1 tablet by mouth daily.   . SUMAtriptan (IMITREX) 50 MG tablet Take 1 tablet (50 mg total) by mouth every 2 (two) hours as needed for migraine. May repeat in 2 hours if headache persists or recurs.     Allergies:   Other   Social History   Socioeconomic History  . Marital status: Single    Spouse name: Not on file  . Number of children: 1  . Years of education: BS  . Highest education level: Not on file  Occupational History  . Occupation: Garment/textile technologist  Tobacco Use  . Smoking status: Never Smoker  . Smokeless tobacco: Never Used  Substance and Sexual Activity  . Alcohol use: Yes    Comment: occasional  . Drug use: No  . Sexual activity: Yes  Other Topics Concern  . Not on file  Social History Narrative   Lives at home with her mother and son.   Right-handed.   Occasional soda.   Social Determinants of Health   Financial Resource Strain:   . Difficulty of Paying Living Expenses: Not on file  Food Insecurity:   . Worried About Charity fundraiser in the Last Year: Not on file  . Ran Out of Food in the Last Year: Not on file  Transportation Needs:   . Lack of Transportation (Medical): Not on file  . Lack of Transportation (Non-Medical): Not on file  Physical Activity:   . Days of Exercise per Week: Not on file  . Minutes of Exercise per Session: Not on file  Stress:   . Feeling of Stress : Not on file  Social Connections:   . Frequency of Communication with Friends and Family: Not on file  . Frequency of Social Gatherings with Friends and Family: Not on file  . Attends Religious Services: Not on file  . Active Member of Clubs or Organizations: Not on file  . Attends Archivist Meetings: Not on file  . Marital Status: Not  on file     Family History: The patient's family history includes Asthma in her brother; Hypercholesterolemia in her mother; Hypertension in her father; Sarcoidosis in her mother; Sickle cell trait in her brother. There is no history of Breast cancer.  ROS:   Please see the history of present illness.    ROS  All other systems reviewed and negative.   EKGs/Labs/Other Studies Reviewed:  The following studies were reviewed today: none  EKG:  EKG is not ordered today.   Recent Labs: 01/11/2019: ALT 11 01/12/2019: TSH 3.170 04/03/2019: BUN 15; Creatinine, Ser 0.82; Hemoglobin 12.9; Platelets 370; Potassium 4.2; Sodium 136   Recent Lipid Panel No results found for: CHOL, TRIG, HDL, CHOLHDL, VLDL, LDLCALC, LDLDIRECT  Physical Exam:    VS:  BP (!) 120/58   Pulse (!) 58   Ht 5\' 7"  (1.702 m)   Wt 189 lb 12.8 oz (86.1 kg)   SpO2 99%   BMI 29.73 kg/m     Wt Readings from Last 3 Encounters:  12/18/19 189 lb 12.8 oz (86.1 kg)  12/13/19 190 lb 9.6 oz (86.5 kg)  11/20/19 185 lb (83.9 kg)     GEN:  Well nourished, well developed in no acute distress HEENT: Normal NECK: No JVD; No carotid bruits LYMPHATICS: No lymphadenopathy CARDIAC: RRR, no murmurs, rubs, gallops RESPIRATORY:  Clear to auscultation without rales, wheezing or rhonchi  ABDOMEN: Soft, non-tender, non-distended MUSCULOSKELETAL:  No edema; No deformity  SKIN: Warm and dry NEUROLOGIC:  Alert and oriented x 3 PSYCHIATRIC:  Normal affect   ASSESSMENT:    1. Paroxysmal atrial fibrillation (HCC)   2. Essential hypertension   3. Chest pain of uncertain etiology    PLAN:    In order of problems listed above:  1.  PAF -followed in afib clinic -CHADS2VASC score is 1 so not on anticoagulation -still having intermittent palpitations and her apple watch has registered HRs as high as 190bpm.  Unfortunately her heart monitor was only for 1 week and may not have picked up anything at that time -recent event monitor  showed NSR with occasional PACs, nonsustained atrial tach up to 4 beats and rare PVCs but no afib -continue Flecainide 50mg  BID, Toprol XL 12.5mg  daily  -cannot increase BB further due to bradycardia -repeat monitor with an event monitor for 20 days to see if we can catch her rapid heart beats   2.  HTN -BP controlled -continue Toprol XL 12.5mg  daily  3.  Chest tightness -reported at last afib clinic visit -coronary CTA with calcium score of 0 and no CAD in June -? If caused by PVCs and PACs -it is associated with significant SOB -will check stat DDImer -refer to pulmonary -recommended she talk with her PCP about starting a PPI in case this is GERD with esophageal reflux  I will see her back as a virtual visit in 5 weeks  Medication Adjustments/Labs and Tests Ordered: Current medicines are reviewed at length with the patient today.  Concerns regarding medicines are outlined above.  No orders of the defined types were placed in this encounter.  No orders of the defined types were placed in this encounter.   Signed, Fransico Him, MD  12/18/2019 3:45 PM    [place]

## 2019-12-18 ENCOUNTER — Encounter: Payer: Self-pay | Admitting: *Deleted

## 2019-12-18 ENCOUNTER — Other Ambulatory Visit: Payer: Self-pay

## 2019-12-18 ENCOUNTER — Ambulatory Visit (INDEPENDENT_AMBULATORY_CARE_PROVIDER_SITE_OTHER): Payer: Managed Care, Other (non HMO) | Admitting: Cardiology

## 2019-12-18 VITALS — BP 120/58 | HR 58 | Ht 67.0 in | Wt 189.8 lb

## 2019-12-18 DIAGNOSIS — I10 Essential (primary) hypertension: Secondary | ICD-10-CM | POA: Diagnosis not present

## 2019-12-18 DIAGNOSIS — I480 Paroxysmal atrial fibrillation: Secondary | ICD-10-CM | POA: Diagnosis not present

## 2019-12-18 DIAGNOSIS — R0602 Shortness of breath: Secondary | ICD-10-CM

## 2019-12-18 DIAGNOSIS — R079 Chest pain, unspecified: Secondary | ICD-10-CM

## 2019-12-18 LAB — D-DIMER, QUANTITATIVE: D-DIMER: 0.45 mg/L FEU (ref 0.00–0.49)

## 2019-12-18 NOTE — Patient Instructions (Signed)
Medication Instructions:  Your physician recommends that you continue on your current medications as directed. Please refer to the Current Medication list given to you today.  *If you need a refill on your cardiac medications before your next appointment, please call your pharmacy*  Lab Work: TODAY: D-dimer  If you have labs (blood work) drawn today and your tests are completely normal, you will receive your results only by: Marland Kitchen MyChart Message (if you have MyChart) OR . A paper copy in the mail If you have any lab test that is abnormal or we need to change your treatment, we will call you to review the results.  Follow-Up: At [facility], you and your health needs are our priority.  As part of our continuing mission to provide you with exceptional heart care, we have created designated Provider Care Teams.  These Care Teams include your primary Cardiologist (physician) and Advanced Practice Providers (APPs -  Physician Assistants and Nurse Practitioners) who all work together to provide you with the care you need, when you need it.  Your next appointment:   February 18th, 2021 at 1:00PM  The format for your next appointment:   Virtual Visit   Provider:   Fransico Him, MD  Other Instructions   [employer] Cardiac Event Monitor Instructions Your physician has requested you wear your cardiac event monitor for _____ days, (1-30). [employer] may call or text to confirm a shipping address. The monitor will be sent to a land address via UPS. [employer] will not ship a monitor to a PO BOX. It typically takes 3-5 days to receive your monitor after it has been enrolled. [employer] will assist with USPS tracking if your package is delayed. The telephone number for [employer] is [phone]. Once you have received your monitor, please review the enclosed instructions. Instruction tutorials can also be viewed under help and settings on the enclosed cell phone. Your monitor has already been  registered assigning a specific monitor serial # to you.  Applying the monitor Remove cell phone from case and turn it on. The cell phone works as Dealer and needs to be within [employer] of you at all times. The cell phone will need to be charged on a daily basis. We recommend you plug the cell phone into the enclosed charger at your bedside table every night.  Monitor batteries: You will receive two monitor batteries labelled #1 and #2. These are your recorders. Plug battery #2 onto the second connection on the enclosed charger. Keep one battery on the charger at all times. This will keep the monitor battery deactivated. It will also keep it fully charged for when you need to switch your monitor batteries. A small light will be blinking on the battery emblem when it is charging. The light on the battery emblem will remain on when the battery is fully charged.  Open package of a Monitor strip. Insert battery #1 into black hood on strip and gently squeeze monitor battery onto connection as indicated in instruction booklet. Set aside while preparing skin.  Choose location for your strip, vertical or horizontal, as indicated in the instruction booklet. Shave to remove all hair from location. There cannot be any lotions, oils, powders, or colognes on skin where monitor is to be applied. Wipe skin clean with enclosed Saline wipe. Dry skin completely.  Peel paper labeled #1 off the back of the Monitor strip exposing the adhesive. Place the monitor on the chest in the vertical or horizontal position shown in the  instruction booklet. One arrow on the monitor strip must be pointing upward. Carefully remove paper labeled #2, attaching remainder of strip to your skin. Try not to create any folds or wrinkles in the strip as you apply it.  Firmly press and release the circle in the center of the monitor battery. You will hear a small beep. This is turning the monitor battery on. The heart  emblem on the monitor battery will light up every 5 seconds if the monitor battery in turned on and connected to the patient securely. Do not push and hold the circle down as this turns the monitor battery off. The cell phone will locate the monitor battery. A screen will appear on the cell phone checking the connection of your monitor strip. This may read poor connection initially but change to good connection within the next minute. Once your monitor accepts the connection you will hear a series of 3 beeps followed by a climbing crescendo of beeps. A screen will appear on the cell phone showing the two monitor strip placement options. Touch the picture that demonstrates where you applied the monitor strip.  Your monitor strip and battery are waterproof. You are able to shower, bathe, or swim with the monitor on. They just ask you do not submerge deeper than 3 feet underwater. We recommend removing the monitor if you are swimming in a lake, river, or ocean.  Your monitor battery will need to be switched to a fully charged monitor battery approximately once a week. The cell phone will alert you of an action which needs to be made.  On the cell phone, tap for details to reveal connection status, monitor battery status, and cell phone battery status. The green dots indicates your monitor is in good status. A red dot indicates there is something that needs your attention.  To record a symptom, click the circle on the monitor battery. In 30-60 seconds a list of symptoms will appear on the cell phone. Select your symptom and tap save. Your monitor will record a sustained or significant arrhythmia regardless of you clicking the button. Some patients do not feel the heart rhythm irregularities. [employer] will notify us of any serious or critical events.  Refer to instruction booklet for instructions on switching batteries, changing strips, the Do not disturb or Pause features, or any additional  questions.  Call [employer] at [phone], to confirm your monitor is transmitting and record your baseline. They will answer any questions you may have regarding the monitor instructions at that time.  Returning the monitor to [place] all equipment back into blue box. Peel off strip of paper to expose adhesive and close box securely. There is a prepaid UPS shipping label on this box. Drop in a UPS drop box, or at a UPS facility like [employer]. You may also contact [employer] to arrange UPS to pick up monitor package at your home.

## 2019-12-18 NOTE — Progress Notes (Signed)
Patient ID: Janice Flores, female   DOB: 1976/06/24, 44 y.o.   MRN: [id number] Patient enrolled for [employer] to ship a 30 day cardiac event monitor to the patients home.  Instructions included in the monitor kit.

## 2019-12-27 ENCOUNTER — Ambulatory Visit (INDEPENDENT_AMBULATORY_CARE_PROVIDER_SITE_OTHER): Payer: Managed Care, Other (non HMO)

## 2019-12-27 ENCOUNTER — Encounter: Payer: Self-pay | Admitting: Cardiology

## 2019-12-27 DIAGNOSIS — I10 Essential (primary) hypertension: Secondary | ICD-10-CM | POA: Diagnosis not present

## 2019-12-27 DIAGNOSIS — I480 Paroxysmal atrial fibrillation: Secondary | ICD-10-CM

## 2019-12-27 DIAGNOSIS — R079 Chest pain, unspecified: Secondary | ICD-10-CM

## 2020-01-03 ENCOUNTER — Ambulatory Visit (HOSPITAL_COMMUNITY): Payer: Commercial Managed Care - PPO | Admitting: Physician Assistant

## 2020-01-04 ENCOUNTER — Encounter: Payer: Self-pay | Admitting: Critical Care Medicine

## 2020-01-04 ENCOUNTER — Ambulatory Visit: Payer: Managed Care, Other (non HMO) | Admitting: Critical Care Medicine

## 2020-01-04 ENCOUNTER — Other Ambulatory Visit: Payer: Self-pay

## 2020-01-04 VITALS — BP 110/70 | HR 59 | Temp 97.8°F | Ht 67.0 in | Wt 191.0 lb

## 2020-01-04 DIAGNOSIS — J4530 Mild persistent asthma, uncomplicated: Secondary | ICD-10-CM | POA: Diagnosis not present

## 2020-01-04 DIAGNOSIS — R0602 Shortness of breath: Secondary | ICD-10-CM

## 2020-01-04 MED ORDER — FLUTICASONE-SALMETEROL 250-50 MCG/DOSE IN AEPB: 1.0000 | INHALATION_SPRAY | Freq: Two times a day (BID) | RESPIRATORY_TRACT | 5 refills | Status: DC

## 2020-01-04 NOTE — Patient Instructions (Addendum)
Thank you for visiting Dr. Carlis Abbott at [facility]. We recommend the following: Orders Placed This Encounter  Procedures  . Pulmonary Function Test   Orders Placed This Encounter  Procedures  . Pulmonary Function Test    Standing Status:   Future    Standing Expiration Date:   01/03/2021    Order Specific Question:   Where should this test be performed?    Answer:   [facility]    Order Specific Question:   Full PFT: includes the following: basic spirometry, spirometry pre & post bronchodilator, diffusion capacity (DLCO), lung volumes    Answer:   Full PFT    Meds ordered this encounter  Medications  . Fluticasone-Salmeterol (ADVAIR DISKUS) 250-50 MCG/DOSE AEPB    Sig: Inhale 1 puff into the lungs 2 (two) times daily.    Dispense:  60 each    Refill:  5    Return in about 2 months (around 03/03/2020).    Please do your part to reduce the spread of COVID-19.

## 2020-01-04 NOTE — Progress Notes (Signed)
Synopsis: Referred in January 2021 for shortness of breath by Donald Prose, MD.  Subjective:   PATIENT ID: Janice Flores Gram GENDER: female DOB: Jun 14, 1976, MRN: [id number]  Chief Complaint  Patient presents with  . Consult    Patient is here for shortness of breath that started a few weeks ago. Patient states that it comes out of no where and she just feels like she can't catch her breath.    Ms. Janice Flores is a 44 y/o woman referred by Cardiology for evaluation of intermittent CP associated with SOB. These episodes have been not associated with significant exertion, with sharp pain that is substernal in location. No associated dizziness, palpitations, heartburn, nausea, diaphoresis. No edema. These episodes have lasted variable amounts of time; the first episode occurred while she was pushing a cart in [place] and resolved on its own after about 30 minutes. She had an episode while trying to cook over the stove and another episode that occurred while she was in the shower, possibly related to the steam. This was after taking a dose of her migraine medication. She had to rest for a while to resolve her symptoms, which gradually improved. She had the sensation that she could not catch her breath. She denies cough, sputum production, wheezing. She did not notice that these episodes were precipitated by strong smells or any other triggers. She is not as physically active as she would like, but prior to these episodes her activity was not limited. She could play with her 83-year-old son. She can walk >1 mile.  She was evaluated by GI yesterday, who did not think her symptoms were attributable to GERD. She has a history of Afib with RVR diagnosed about 1 year ago. She was initially treated with Bblockers for rate control, but was started on flecainide for rhythm control in the April 2020. She was treated with eliquis for 1 month at that time, but due to a low CHADS-Vasc score has not otherwise required  systemic AC. Her Afib was symptomatic when she was in RVR with feeling chest pounding. She has a rhythm monitor on currently to determine if arrhythmias could be associated with her symptoms. Her last known episode in Afib was in April 2020. Her apple watch has never told her that she was in Afib.  She carries a previous diagnosis of mild asthma from her 61s when she had chest tightness and SOB after being around cigarette smoke, strong smells. She has used albuterol only twice in the past year. She has eczema and allergic rhinosinusitis, which she treats with zyrtec when she has seasonal allergies. Her brother had asthma, father had allergies. She is a never smoker/ vaper.  Reviewed 12/18/19 cardiology note- Dr. Radford Pax.     Past Medical History:  Diagnosis Date  . Anemia   . Asthma   . Atrial fibrillation with rapid ventricular response ([place]) 01/12/2019  . Bradycardia 08/13/2016  . Headache   . HSV infection   . Hypothyroidism   . Iron deficiency   . Migraine   . Tachycardia    intermittent tachycardia  . Tingling   . Vaginal Pap smear, abnormal    colposcopy   . Vitamin D deficiency      Family History  Problem Relation Age of Onset  . Hypertension Father   . Hypercholesterolemia Mother   . Sarcoidosis Mother   . Asthma Brother   . Sickle cell trait Brother   . Breast cancer Neg Hx      Past  Surgical History:  Procedure Laterality Date  . CESAREAN SECTION N/A 04/21/2015   Procedure: CESAREAN SECTION;  Surgeon: Everett Graff, MD;  Location: [facility];  Service: Obstetrics;  Laterality: N/A;  . removal of polyp from uterus      Social History   Socioeconomic History  . Marital status: Single    Spouse name: Not on file  . Number of children: 1  . Years of education: BS  . Highest education level: Not on file  Occupational History  . Occupation: Garment/textile technologist  Tobacco Use  . Smoking status: Never Smoker  . Smokeless tobacco: Never Used  Substance and Sexual  Activity  . Alcohol use: Yes    Comment: occasional  . Drug use: No  . Sexual activity: Yes  Other Topics Concern  . Not on file  Social History Narrative   Lives at home with her mother and son.   Right-handed.   Occasional soda.   Social Determinants of Health   Financial Resource Strain:   . Difficulty of Paying Living Expenses: Not on file  Food Insecurity:   . Worried About Charity fundraiser in the Last Year: Not on file  . Ran Out of Food in the Last Year: Not on file  Transportation Needs:   . Lack of Transportation (Medical): Not on file  . Lack of Transportation (Non-Medical): Not on file  Physical Activity:   . Days of Exercise per Week: Not on file  . Minutes of Exercise per Session: Not on file  Stress:   . Feeling of Stress : Not on file  Social Connections:   . Frequency of Communication with Friends and Family: Not on file  . Frequency of Social Gatherings with Friends and Family: Not on file  . Attends Religious Services: Not on file  . Active Member of Clubs or Organizations: Not on file  . Attends Archivist Meetings: Not on file  . Marital Status: Not on file  Intimate Partner Violence:   . Fear of Current or Ex-Partner: Not on file  . Emotionally Abused: Not on file  . Physically Abused: Not on file  . Sexually Abused: Not on file     Allergies  Allergen Reactions  . Other Swelling    cherries     Immunization History  Administered Date(s) Administered  . Influenza Split 09/18/2013  . Influenza, High Dose Seasonal PF 10/07/2012  . Influenza,inj,Quad PF,6-35 Mos 10/04/2019  . Influenza-Unspecified 10/28/2011  . Tdap 10/07/2012, 03/08/2015    Outpatient Medications Prior to Visit  Medication Sig Dispense Refill  . Ascorbic Acid (VITAMIN C) 1000 MG tablet Take 2,000 mg by mouth daily.    . cetirizine (ZYRTEC) 10 MG chewable tablet Chew 10 mg by mouth as needed.     . Cholecalciferol (VITAMIN D-3 PO) Take by mouth daily. Taking  1000mg  daily    . ferrous sulfate 325 (65 FE) MG EC tablet Take 325 mg by mouth daily. Taking one tablet daily    . flecainide (TAMBOCOR) 50 MG tablet Take 1 tablet (50 mg total) by mouth 2 (two) times daily. 180 tablet 2  . levothyroxine (SYNTHROID, LEVOTHROID) 75 MCG tablet Take 75 mcg by mouth daily before breakfast.    . metoprolol succinate (TOPROL-XL) 25 MG 24 hr tablet TAKE 1 TABLET BY MOUTH  DAILY (Patient taking differently: 12.5 mg daily. ) 90 tablet 1  . Misc Natural Products (TUMERSAID PO) Take by mouth. Taking one gummy daily- not sure the dosage    .  Multiple Vitamins-Minerals (MULTIVITAMIN ADULT PO) Take 1 tablet by mouth daily.     . SUMAtriptan (IMITREX) 50 MG tablet Take 1 tablet (50 mg total) by mouth every 2 (two) hours as needed for migraine. May repeat in 2 hours if headache persists or recurs. 12 tablet 11   No facility-administered medications prior to visit.    Review of Systems  Constitutional: Negative for chills, fever and weight loss.  Respiratory: Positive for shortness of breath. Negative for cough, sputum production and wheezing.   Cardiovascular: Positive for chest pain. Negative for palpitations and leg swelling.  Gastrointestinal: Negative for heartburn.  Skin: Negative for rash.     Objective:   Vitals:   01/04/20 1354  BP: 110/70  Pulse: (!) 59  Temp: 97.8 F (36.6 C)  TempSrc: Temporal  SpO2: 98%  Weight: 191 lb (86.6 kg)  Height: 5\' 7"  (1.702 m)   98% on   RA BMI Readings from Last 3 Encounters:  01/04/20 29.91 kg/m  12/18/19 29.73 kg/m  12/13/19 29.85 kg/m   Wt Readings from Last 3 Encounters:  01/04/20 191 lb (86.6 kg)  12/18/19 189 lb 12.8 oz (86.1 kg)  12/13/19 190 lb 9.6 oz (86.5 kg)    Physical Exam Vitals reviewed.  HENT:     Head: Normocephalic and atraumatic.     Nose:     Comments: Deferred due to masking requirement.    Mouth/Throat:     Comments: Deferred due to masking requirement. Eyes:     General: No  scleral icterus. Cardiovascular:     Rate and Rhythm: Normal rate and regular rhythm.     Heart sounds: No murmur.  Pulmonary:     Comments: Breathing comfortably on RA, CTAB Abdominal:     General: There is no distension.     Palpations: Abdomen is soft.     Tenderness: There is no abdominal tenderness.  Musculoskeletal:        General: No swelling or deformity.     Cervical back: Neck supple.  Lymphadenopathy:     Cervical: No cervical adenopathy.  Skin:    General: Skin is warm and dry.     Findings: No rash.  Neurological:     General: No focal deficit present.     Mental Status: She is alert.     Motor: No weakness.     Coordination: Coordination normal.  Psychiatric:        Mood and Affect: Mood normal.        Behavior: Behavior normal.      CBC    Component Value Date/Time   WBC 4.2 04/03/2019 1851   RBC 4.34 04/03/2019 1851   HGB 12.9 04/03/2019 1851   HGB 11.9 04/12/2017 0935   HCT 40.3 04/03/2019 1851   HCT 34.2 04/12/2017 0935   PLT 370 04/03/2019 1851   PLT 327 04/12/2017 0935   MCV 92.9 04/03/2019 1851   MCV 86 04/12/2017 0935   MCH 29.7 04/03/2019 1851   MCHC 32.0 04/03/2019 1851   RDW 13.1 04/03/2019 1851   RDW 13.0 04/12/2017 0935   LYMPHSABS 1.0 04/03/2019 1851   MONOABS 0.5 04/03/2019 1851   EOSABS 0.2 04/03/2019 1851   BASOSABS 0.0 04/03/2019 1851    CHEMISTRY No results for input(s): NA, K, CL, CO2, GLUCOSE, BUN, CREATININE, CALCIUM, MG, PHOS in the last 168 hours. CrCl cannot be calculated (Patient's most recent lab result is older than the maximum 21 days allowed.).   Chest Imaging- films reviewed: CTA  chest 01/11/2019-mild airway thickening, normal lung parenchyma.  No PE or mediastinal adenopathy.    Pulmonary Functions Testing Results: No flowsheet data found.   Echocardiogram 01/18/2019: LVEF greater than 65%, hyperdynamic systolic function, normal diastolic function.  LA, RV, RA.  Trivial TR, otherwise normal valves.      Assessment & Plan:     ICD-10-CM   1. Mild persistent allergic asthma  J45.30 Pulmonary Function Test  2. SOB (shortness of breath)  R06.02     Episodic SOB with associated with non-anginal chest pain; possibly mild persistent asthma. She has had 2 episodes precipitated by smoke/ steam. She has a history of allergies & eczema. Less likely is symptomatic paroxysms of rate-controlled Afib that have gone undetected. -agree with cardiac event monitoring to rule out symptomatic pAF -trial of Advair BID, which she has been on in the past. Instructed to rinse her mouth after every use -albuterol Q4h PRN -PFTs -UTD flu shot. -Recommend covid vaccine when it is available. Continue covid precautions- social distancing, mask wearing, hand washing.  Follow up in 2 months with PFTs.  Current Outpatient Medications:  .  Ascorbic Acid (VITAMIN C) 1000 MG tablet, Take 2,000 mg by mouth daily., Disp: , Rfl:  .  cetirizine (ZYRTEC) 10 MG chewable tablet, Chew 10 mg by mouth as needed. , Disp: , Rfl:  .  Cholecalciferol (VITAMIN D-3 PO), Take by mouth daily. Taking 1000mg  daily, Disp: , Rfl:  .  ferrous sulfate 325 (65 FE) MG EC tablet, Take 325 mg by mouth daily. Taking one tablet daily, Disp: , Rfl:  .  flecainide (TAMBOCOR) 50 MG tablet, Take 1 tablet (50 mg total) by mouth 2 (two) times daily., Disp: 180 tablet, Rfl: 2 .  levothyroxine (SYNTHROID, LEVOTHROID) 75 MCG tablet, Take 75 mcg by mouth daily before breakfast., Disp: , Rfl:  .  metoprolol succinate (TOPROL-XL) 25 MG 24 hr tablet, TAKE 1 TABLET BY MOUTH  DAILY (Patient taking differently: 12.5 mg daily. ), Disp: 90 tablet, Rfl: 1 .  Misc Natural Products (TUMERSAID PO), Take by mouth. Taking one gummy daily- not sure the dosage, Disp: , Rfl:  .  Multiple Vitamins-Minerals (MULTIVITAMIN ADULT PO), Take 1 tablet by mouth daily. , Disp: , Rfl:  .  SUMAtriptan (IMITREX) 50 MG tablet, Take 1 tablet (50 mg total) by mouth every 2 (two) hours as needed  for migraine. May repeat in 2 hours if headache persists or recurs., Disp: 12 tablet, Rfl: [place] Seymone Forlenza, DO [facility] 01/04/2020 2:09 PM

## 2020-01-10 ENCOUNTER — Other Ambulatory Visit: Payer: Managed Care, Other (non HMO)

## 2020-01-10 ENCOUNTER — Telehealth: Payer: Self-pay | Admitting: Medical

## 2020-01-10 ENCOUNTER — Telehealth (HOSPITAL_COMMUNITY): Payer: Self-pay | Admitting: *Deleted

## 2020-01-10 NOTE — Telephone Encounter (Addendum)
Strip reviewed by Malka So PA and Dr. Rayann Heman - not NSVT but afib with abberancy for 11 beats. Have left a message for patient to return my call. Pt was reported asymptomatic with epsiode.  Pt states she knew nothing about the episode until called by monitor company. Has pushed her button for symptoms several times since she started wearing it - is supposed to take monitor off on 2/18 and return. Per Adline Peals PA - recommends continue wearing monitor once full report known if AF burden up would warrant changes to medication possible increase of flecainide. He did not want to make any changes at this point and patient agreed office visit today was not needed. She will let our office know if her symptoms progress. She currently has follow up with Dr. Radford Pax once her monitor has resulted.

## 2020-01-10 NOTE — Telephone Encounter (Signed)
   Notified by [employer] heart monitor that patient had an 11 beat run of NSVT this morning with return to NSR afterwards. Patient was contacted and sleeping at the time/asymptomatic. Will route to MD as FYI. Will continue to monitor for now.   Abigail Butts, PA-C 01/10/20; 7:03 AM

## 2020-01-10 NOTE — Telephone Encounter (Signed)
This has been addressed.  Patient is on Flecainide and will get back into afib clinic ASAP

## 2020-01-12 ENCOUNTER — Other Ambulatory Visit: Payer: Managed Care, Other (non HMO)

## 2020-01-25 ENCOUNTER — Telehealth: Payer: Managed Care, Other (non HMO) | Admitting: Cardiology

## 2020-02-01 ENCOUNTER — Telehealth: Payer: Self-pay

## 2020-02-01 ENCOUNTER — Telehealth: Payer: Self-pay | Admitting: *Deleted

## 2020-02-01 ENCOUNTER — Encounter: Payer: Self-pay | Admitting: Cardiology

## 2020-02-01 ENCOUNTER — Other Ambulatory Visit: Payer: Self-pay

## 2020-02-01 ENCOUNTER — Telehealth (INDEPENDENT_AMBULATORY_CARE_PROVIDER_SITE_OTHER): Payer: Managed Care, Other (non HMO) | Admitting: Cardiology

## 2020-02-01 VITALS — Ht 67.0 in | Wt 188.0 lb

## 2020-02-01 DIAGNOSIS — R079 Chest pain, unspecified: Secondary | ICD-10-CM

## 2020-02-01 DIAGNOSIS — R0602 Shortness of breath: Secondary | ICD-10-CM

## 2020-02-01 DIAGNOSIS — I10 Essential (primary) hypertension: Secondary | ICD-10-CM

## 2020-02-01 DIAGNOSIS — I480 Paroxysmal atrial fibrillation: Secondary | ICD-10-CM

## 2020-02-01 NOTE — Telephone Encounter (Signed)
Patient Name: Janice Flores        DOB: [birth year]      Height: 5\' 7"     Weight: 188 lb  Office Name: [facility]         Referring Provider: Dr. Fransico Him  Today's Date: 02/01/2020  Date:   STOP BANG RISK ASSESSMENT S (snore) Have you been told that you snore?     NO   T (tired) Are you often tired, fatigued, or sleepy during the day?   YES  O (obstruction) Do you stop breathing, choke, or gasp during sleep? NO   P (pressure) Do you have or are you being treated for high blood pressure? NO   B (BMI) Is your body index greater than 35 kg/m? NO   A (age) Are you 82 years old or older? NO   N (neck) Do you have a neck circumference greater than 16 inches?   Unknown   G (gender) Are you a female? NO   TOTAL STOP/BANG "YES" ANSWERS                                                                        For Office Use Only              Procedure Order Form    YES to 3+ Stop Bang questions OR two clinical symptoms - patient qualifies for [employer] (CPT 95800)     Submit: This Form + Patient Face Sheet + Clinical Note via [employer] or Fax: [phone]         Clinical Notes: Will consult Sleep Specialist and refer for management of therapy due to patient increased risk of Sleep Apnea. Ordering a sleep study due to the following two clinical symptoms: Excessive daytime sleepiness G47.10 / Gastroesophageal reflux K21.9 / Nocturia R35.1 / Morning Headaches G44.221 / Difficulty concentrating R41.840 / Memory problems or poor judgment G31.84 / Personality changes or irritability R45.4 / Loud snoring R06.83 / Depression F32.9 / Unrefreshed by sleep G47.8 / Impotence N52.9 / History of high blood pressure R03.0 / Insomnia G47.00    I understand that I am proceeding with a home sleep apnea test as ordered by my treating physician. I understand that untreated sleep apnea is a serious cardiovascular risk factor and it is my responsibility to perform the test and seek management for sleep  apnea. I will be contacted with the results and be managed for sleep apnea by a local sleep physician. I will be receiving equipment and further instructions from [facility]. I shall promptly ship back the equipment via the included mailing label. I understand my insurance will be billed for the test and as the patient I am responsible for any insurance related out-of-pocket costs incurred. I have been provided with written instructions and can call for additional video or telephonic instruction, with 24-hour availability of qualified personnel to answer any questions: Patient Help Desk [phone].  Patient Signature ______________________________________________________   Date______________________ Patient Telemedicine Verbal Consent

## 2020-02-01 NOTE — Telephone Encounter (Signed)
-----   Message from Antonieta Iba, RN sent at 02/01/2020 10:05 AM EST ----- Regarding: Janice Flores Janice Flores sleep study has been ordered.  Patient's office note, bang assessment, and demographics have been faxed to [facility].

## 2020-02-01 NOTE — Patient Instructions (Signed)
Medication Instructions:  Your physician recommends that you continue on your current medications as directed. Please refer to the Current Medication list given to you today.  *If you need a refill on your cardiac medications before your next appointment, please call your pharmacy*  Testing/Procedures: Your physician has recommended that you have a sleep study. This test records several body functions during sleep, including: brain activity, eye movement, oxygen and carbon dioxide blood levels, heart rate and rhythm, breathing rate and rhythm, the flow of air through your mouth and nose, snoring, body muscle movements, and chest and belly movement.  Follow-Up: At [facility], you and your health needs are our priority.  As part of our continuing mission to provide you with exceptional heart care, we have created designated Provider Care Teams.  These Care Teams include your primary Cardiologist (physician) and Advanced Practice Providers (APPs -  Physician Assistants and Nurse Practitioners) who all work together to provide you with the care you need, when you need it.  Your next appointment:   6 month(s)  The format for your next appointment:   In Person  Provider:   Fransico Him, MD

## 2020-02-01 NOTE — Addendum Note (Signed)
Addended by: Antonieta Iba on: 02/01/2020 08:28 AM   Modules accepted: Orders

## 2020-02-01 NOTE — Progress Notes (Signed)
Virtual Visit via Telephone Note   This visit type was conducted due to national recommendations for restrictions regarding the COVID-19 Pandemic (e.g. social distancing) in an effort to limit this patient's exposure and mitigate transmission in our community.  Due to her co-morbid illnesses, this patient is at least at moderate risk for complications without adequate follow up.  This format is felt to be most appropriate for this patient at this time.  All issues noted in this document were discussed and addressed.  A limited physical exam was performed with this format.  Please refer to the patient's chart for her consent to telehealth for [facility].    Evaluation Performed:  Follow-up visit  This visit type was conducted due to national recommendations for restrictions regarding the COVID-19 Pandemic (e.g. social distancing).  This format is felt to be most appropriate for this patient at this time.  All issues noted in this document were discussed and addressed.  No physical exam was performed (except for noted visual exam findings with Video Visits).  Please refer to the patient's chart (MyChart message for video visits and phone note for telephone visits) for the patient's consent to telehealth for [facility].  Date:  02/01/2020   ID:  Janice Flores, DOB May 29, 1976, MRN [id number]  Patient Location:  Home  Provider location:   [place]  PCP:  Sueanne Margarita, MD  Cardiologist:  Fransico Him, MD  Electrophysiologist:  None   Chief Complaint:  Atrial flutter, HTN  History of Present Illness:    Janice Flores is a 44 y.o. female who presents via audio/video conferencing for a telehealth visit today.    Janice Flores is a 44y.o. female with a hx of asthma, anemia, chronic headaches, bradycardia, hypothyroidism and iron deficiency anemia.She has a history of PVCs and recently was diagnosed with new onset atrial fibrillation with RVR. Her  ChADS2VASC score is1 (female)and she was not anticoagulated. D-dimer was elevated but chest CTA showed no evidence of PE.She converted on beta-blocker.When I last saw her in February she was encouraged not to drink any caffeine and avoid alcohol. 2D echo was done and was normal with EF greater than 65% and normal left atrial size.  She called in on 04/03/2019 stating that her heart rate was fluctuating while she was sitting and will go as high as 123 bpm.She was seen in A. fib clinic on 4/29/2020and was still complaining of palpitations. Antiarrhythmic drug therapy was discussed with the patient andshe wanted to pursue flecainide therapy. Since she had been in atrial fibrillation more than 48 hours she was started on Eliquis 5 mg twice daily. Plan was to start flecainide when she had been on Eliquis for 3 weeks without any missed doses. Her Toprol-XL was increased to 50 mg daily for better rate control.. A coronary CTA was done before initiating Flecainide showing a calcium score of 0 and no CAD.   She was last seen a week ago in afib clinic and was doing well.  She continued to have atypical CP lasting 1-2 minutes at a time.  She was continued on Flecainide 50mg  BID and Toprol.    I saw her back in January and was continuing to complain of CP and SOB.  She was also still complaining of palpitaitons and Event monitor done showed PACs, nonsustained atrial tach and rare PVCs.  Her BB could not be increased due to resting bradycardia.  A repeat event monitor was obtained and showed NSR with short  bursts of WCT.  SHe called afib clinic the next day and they reviewed the rhythm strips and felt it was afib with aberation and no changes were made. She was also referred to Pulmonary for SOB and was dx with asthma and is now on an inhaler.  She was seen by GI and has no GERD and was set up for colonscopy.   She is here today for followup and is doing well.  She says that the sharp quality discomfort  has significant improved.  She denies any  SOB after going on the inhaler.  She denies any DOE, PND, orthopnea, LE edema, dizziness,  or syncope. She has started kickboxing and is feeling much better than last time we talked.  Her palpitations have significant improved. She is compliant with her meds and is tolerating meds with no SE.    The patient does not have symptoms concerning for COVID-19 infection (fever, chills, cough, or new shortness of breath).    Prior CV studies:   The following studies were reviewed today:  Event monitor  Past Medical History:  Diagnosis Date  . Anemia   . Asthma   . Atrial fibrillation with rapid ventricular response ([place]) 01/12/2019  . Bradycardia 08/13/2016  . Headache   . HSV infection   . Hypothyroidism   . Iron deficiency   . Migraine   . Tachycardia    intermittent tachycardia  . Tingling   . Vaginal Pap smear, abnormal    colposcopy   . Vitamin D deficiency    Past Surgical History:  Procedure Laterality Date  . CESAREAN SECTION N/A 04/21/2015   Procedure: CESAREAN SECTION;  Surgeon: Everett Graff, MD;  Location: [facility];  Service: Obstetrics;  Laterality: N/A;  . removal of polyp from uterus       No outpatient medications have been marked as taking for the 02/01/20 encounter (Appointment) with Sueanne Margarita, MD.     Allergies:   Other   Social History   Tobacco Use  . Smoking status: Never Smoker  . Smokeless tobacco: Never Used  Substance Use Topics  . Alcohol use: Yes    Comment: occasional  . Drug use: No     Family Hx: The patient's family history includes Asthma in her brother; Hypercholesterolemia in her mother; Hypertension in her father; Sarcoidosis in her mother; Sickle cell trait in her brother. There is no history of Breast cancer.  ROS:   Please see the history of present illness.     All other systems reviewed and are negative.   Labs/Other Tests and Data Reviewed:    Recent Labs: 04/03/2019: BUN 15;  Creatinine, Ser 0.82; Hemoglobin 12.9; Platelets 370; Potassium 4.2; Sodium 136   Recent Lipid Panel No results found for: CHOL, TRIG, HDL, CHOLHDL, LDLCALC, LDLDIRECT  Wt Readings from Last 3 Encounters:  01/04/20 191 lb (86.6 kg)  12/18/19 189 lb 12.8 oz (86.1 kg)  12/13/19 190 lb 9.6 oz (86.5 kg)     Objective:    Vital Signs:  There were no vitals taken for this visit.   CONSTITUTIONAL:  Well nourished, well developed female in no acute distress.  EYES: anicteric MOUTH: oral mucosa is pink RESPIRATORY: Normal respiratory effort, symmetric expansion CARDIOVASCULAR: No peripheral edema SKIN: No rash, lesions or ulcers MUSCULOSKELETAL: no digital cyanosis NEURO: Cranial Nerves II-XII grossly intact, moves all extremities PSYCH: Intact judgement and insight.  A&O x 3, Mood/affect appropriate   ASSESSMENT & PLAN:    1.  PAF --  followed in afib clinic -CHADS2VASC score is 1 so not on anticoagulation -recent event monitor for continued palpitations showed NSR with short bursts of WCT and she talked with afb clinic and felt it was PAF with aberration -her palpitations have significantly improved -continue Flecainide 50mg  BID, Toprol XL 12.5mg  daily  -cannot increase BB further due to bradycardia -she has followup in afib clinic in April -recommend home sleep study to rule out OSA  2.  HTN -BP controlled -continue Toprol XL 12.5mg  daily  3.  Atypical CP -coronary CTA showed a calcium score of 0 and no CAD -referred back to PCP for further workup -her CP has significantly improved and is now doing kickboxing with no problems  COVID-19 Education: The signs and symptoms of COVID-19 were discussed with the patient and how to seek care for testing (follow up with PCP or arrange E-visit).  The importance of social distancing was discussed today.  Patient Risk:   After full review of this patient's clinical status, I feel that they are at least moderate risk at this  time.  Time:   Today, I have spent 20 minutes on telemedicine discussing medical problems including Afib, HTN, CP and reviewing patient's chart including event monitor.  Medication Adjustments/Labs and Tests Ordered: Current medicines are reviewed at length with the patient today.  Concerns regarding medicines are outlined above.  Tests Ordered: No orders of the defined types were placed in this encounter.  Medication Changes: No orders of the defined types were placed in this encounter.   Disposition:  Follow up in 6 month(s)  Signed, Fransico Him, MD  02/01/2020 7:54 AM    [facility]

## 2020-02-19 ENCOUNTER — Other Ambulatory Visit: Payer: Self-pay | Admitting: Obstetrics and Gynecology

## 2020-03-01 ENCOUNTER — Other Ambulatory Visit (HOSPITAL_COMMUNITY)
Admission: RE | Admit: 2020-03-01 | Discharge: 2020-03-01 | Disposition: A | Discharge status: Home / Self Care | Payer: Managed Care, Other (non HMO) | Source: Ambulatory Visit | Attending: Critical Care Medicine | Admitting: Critical Care Medicine

## 2020-03-01 DIAGNOSIS — Z01812 Encounter for preprocedural laboratory examination: Secondary | ICD-10-CM | POA: Insufficient documentation

## 2020-03-01 DIAGNOSIS — Z20822 Contact with and (suspected) exposure to covid-19: Secondary | ICD-10-CM | POA: Insufficient documentation

## 2020-03-01 LAB — SARS CORONAVIRUS 2 (TAT 6-24 HRS): SARS Coronavirus 2: NEGATIVE

## 2020-03-04 ENCOUNTER — Ambulatory Visit (INDEPENDENT_AMBULATORY_CARE_PROVIDER_SITE_OTHER): Payer: Managed Care, Other (non HMO) | Admitting: Critical Care Medicine

## 2020-03-04 ENCOUNTER — Other Ambulatory Visit: Payer: Self-pay

## 2020-03-04 ENCOUNTER — Ambulatory Visit: Payer: Managed Care, Other (non HMO) | Attending: Internal Medicine

## 2020-03-04 DIAGNOSIS — J4530 Mild persistent asthma, uncomplicated: Secondary | ICD-10-CM

## 2020-03-04 DIAGNOSIS — Z23 Encounter for immunization: Secondary | ICD-10-CM

## 2020-03-04 LAB — PULMONARY FUNCTION TEST
DL/VA % pred: 141 %
DL/VA: 6.04 ml/min/mmHg/L
DLCO cor % pred: 99 %
DLCO cor: 23.62 ml/min/mmHg
DLCO unc % pred: 99 %
DLCO unc: 23.62 ml/min/mmHg
FEF 25-75 Post: 2.31 L/sec
FEF 25-75 Pre: 3.1 L/sec
FEF2575-%Change-Post: -25 %
FEF2575-%Pred-Post: 82 %
FEF2575-%Pred-Pre: 109 %
FEV1-%Change-Post: -6 %
FEV1-%Pred-Post: 82 %
FEV1-%Pred-Pre: 88 %
FEV1-Post: 2.22 L
FEV1-Pre: 2.38 L
FEV1FVC-%Change-Post: -1 %
FEV1FVC-%Pred-Pre: 104 %
FEV6-%Change-Post: -8 %
FEV6-%Pred-Post: 77 %
FEV6-%Pred-Pre: 85 %
FEV6-Post: 2.52 L
FEV6-Pre: 2.76 L
FEV6FVC-%Change-Post: 0 %
FEV6FVC-%Pred-Post: 102 %
FEV6FVC-%Pred-Pre: 102 %
FVC-%Change-Post: -5 %
FVC-%Pred-Post: 78 %
FVC-%Pred-Pre: 83 %
FVC-Post: 2.61 L
FVC-Pre: 2.76 L
Post FEV1/FVC ratio: 85 %
Post FEV6/FVC ratio: 100 %
Pre FEV1/FVC ratio: 86 %
Pre FEV6/FVC Ratio: 100 %
RV % pred: 94 %
RV: 1.75 L
TLC % pred: 83 %
TLC: 4.57 L

## 2020-03-04 NOTE — Progress Notes (Signed)
Full PFT performed today. °

## 2020-03-04 NOTE — Progress Notes (Signed)
   Covid-19 Vaccination Clinic  Name:  Janice Flores    MRN: [id number] DOB: [birth year]  03/04/2020  [name] was observed post Covid-19 immunization for 15 minutes without incident. She was provided with Vaccine Information Sheet and instruction to access the V-Safe system.   [name] was instructed to call 911 with any severe reactions post vaccine: Marland Kitchen Difficulty breathing  . Swelling of face and throat  . A fast heartbeat  . A bad rash all over body  . Dizziness and weakness   Immunizations Administered    Name Date Dose VIS Date Route   Pfizer COVID-19 Vaccine 03/04/2020 12:24 PM 0.3 mL 11/17/2019 Intramuscular   Manufacturer: [place]   Lot: [id number]   [place]: [id number]

## 2020-03-06 ENCOUNTER — Telehealth: Payer: Self-pay

## 2020-03-06 NOTE — Telephone Encounter (Signed)
Left message for patient to call back  

## 2020-03-06 NOTE — Telephone Encounter (Signed)
[facility] sleep Study Suits, Magdalene Patricia, RN  Sueanne Margarita, MD; Freada Bergeron, CMA  [facility] was unable to contact patient to arrange sleep study, so she has been voided from their system. Please let me know if ok to cancel order.

## 2020-03-06 NOTE — Telephone Encounter (Signed)
-----   Message from Sueanne Margarita, MD sent at 03/04/2020  8:09 PM EDT ----- Regarding: RE: [facility] sleep Study Please call patient to find out why she has not responded with her sleep study to [facility] ----- Message ----- From: Chapman Moss, RN Sent: 03/04/2020   1:47 PM EDT To: Sueanne Margarita, MD, Freada Bergeron, CMA Subject: [facility] sleep Study                             [facility] was unable to contact patient to arrange sleep study, so she has been voided from their system.  Please let me know if ok to cancel order.  Thank you

## 2020-03-06 NOTE — Telephone Encounter (Signed)
Reached out to patient and she wanted to contact [facility] to get rescheduled for her HST. I provided her the phone number and she said she would call.

## 2020-03-12 ENCOUNTER — Other Ambulatory Visit: Payer: Self-pay

## 2020-03-12 ENCOUNTER — Encounter (HOSPITAL_COMMUNITY): Payer: Self-pay | Admitting: Physician Assistant

## 2020-03-12 ENCOUNTER — Ambulatory Visit (HOSPITAL_COMMUNITY)
Admission: RE | Admit: 2020-03-12 | Discharge: 2020-03-12 | Disposition: A | Discharge status: Home / Self Care | Payer: Managed Care, Other (non HMO) | Source: Ambulatory Visit | Attending: Physician Assistant | Admitting: Physician Assistant

## 2020-03-12 VITALS — BP 116/70 | HR 51 | Ht 67.0 in | Wt 194.8 lb

## 2020-03-12 DIAGNOSIS — Z7901 Long term (current) use of anticoagulants: Secondary | ICD-10-CM | POA: Diagnosis not present

## 2020-03-12 DIAGNOSIS — I480 Paroxysmal atrial fibrillation: Secondary | ICD-10-CM | POA: Diagnosis present

## 2020-03-12 DIAGNOSIS — E039 Hypothyroidism, unspecified: Secondary | ICD-10-CM | POA: Insufficient documentation

## 2020-03-12 DIAGNOSIS — J45909 Unspecified asthma, uncomplicated: Secondary | ICD-10-CM | POA: Insufficient documentation

## 2020-03-12 DIAGNOSIS — D649 Anemia, unspecified: Secondary | ICD-10-CM | POA: Diagnosis not present

## 2020-03-12 DIAGNOSIS — Z79899 Other long term (current) drug therapy: Secondary | ICD-10-CM | POA: Diagnosis not present

## 2020-03-12 NOTE — Progress Notes (Signed)
Primary Care Physician: Sueanne Margarita, MD Primary Cardiologist: Dr Radford Pax Primary Electrophysiologist: none Referring Physician: Dr Maren Beach Janice Flores is a 44 y.o. female with a history of asthma, hypothyroidism, and paroxysmal atrial fibrillation who presents for follow up in the [facility]. Patient reports that in February she woke in the middle of the night with palpations and SOB. She presented to the ER and was found to be in afib with RVR. She was given metoprolol on discharge and patient reports she spontaneously converted to SR later that day. She had another episode of heart racing on 04/03/19 and again went to the ER and was found to be in atrial flutter with variable conduction. Cardioversion was not pursued at that time. Patient feels that she has been persistently out of rhythm since her ER visit with nearly constant symptoms of palpitations, periodic lightheadedness, and SOB on exertion. Her Apple Watch has shown labile HR up to 140s. She denies snoring or significant alcohol use. There were no triggers during each of these episodes that the patient could identify. She was started on flecainide on 05/22/19.  On follow up today, patient reports that she has done very well since her last visit. She has had only rare, brief palpitations lasting 1-2 seconds. She is exercising regularly by doing walking and kickboxing. She is tolerating the medication without difficulty.   Today, she denies symptoms of chest pain, shortness of breath, orthopnea, PND, lower extremity edema, dizziness, presyncope, syncope, snoring, daytime somnolence, bleeding, or neurologic sequela. The patient is tolerating medications without difficulties and is otherwise without complaint today.    Atrial Fibrillation Risk Factors:  she does not have symptoms or diagnosis of sleep apnea. she does not have a history of rheumatic fever. she does not have a history of alcohol  use. The patient does have a history of early familial atrial fibrillation or other arrhythmias. Mother has afib.  she has a BMI of Body mass index is 30.51 kg/m.Marland Kitchen Filed Weights   03/12/20 0933  Weight: 88.4 kg    Family History  Problem Relation Age of Onset  . Hypertension Father   . Hypercholesterolemia Mother   . Sarcoidosis Mother   . Asthma Brother   . Sickle cell trait Brother   . Breast cancer Neg Hx      Atrial Fibrillation Management history:  Previous antiarrhythmic drugs: flecainide Previous cardioversions: none Previous ablations: none CHADS2VASC score: 1 Anticoagulation history: Eliquis   Past Medical History:  Diagnosis Date  . Anemia   . Asthma   . Atrial fibrillation with rapid ventricular response ([place]) 01/12/2019  . Bradycardia 08/13/2016  . Headache   . HSV infection   . Hypothyroidism   . Iron deficiency   . Migraine   . Tachycardia    intermittent tachycardia  . Tingling   . Vaginal Pap smear, abnormal    colposcopy   . Vitamin D deficiency    Past Surgical History:  Procedure Laterality Date  . CESAREAN SECTION N/A 04/21/2015   Procedure: CESAREAN SECTION;  Surgeon: Everett Graff, MD;  Location: [facility];  Service: Obstetrics;  Laterality: N/A;  . removal of polyp from uterus      Current Outpatient Medications  Medication Sig Dispense Refill  . Ascorbic Acid (VITAMIN C) 1000 MG tablet Take 2,000 mg by mouth daily.    . cetirizine (ZYRTEC) 10 MG chewable tablet Chew 10 mg by mouth as needed.     . Cholecalciferol (  VITAMIN D-3 PO) Take by mouth daily. Taking 1000mg  daily    . ferrous sulfate 325 (65 FE) MG EC tablet Take 325 mg by mouth daily. Taking one tablet daily during cycle    . flecainide (TAMBOCOR) 50 MG tablet Take 1 tablet (50 mg total) by mouth 2 (two) times daily. 180 tablet 2  . Fluticasone-Salmeterol (ADVAIR DISKUS) 250-50 MCG/DOSE AEPB Inhale 1 puff into the lungs 2 (two) times daily. 60 each 5  . levothyroxine (SYNTHROID,  LEVOTHROID) 75 MCG tablet Take 75 mcg by mouth daily before breakfast.    . metoprolol succinate (TOPROL-XL) 25 MG 24 hr tablet TAKE 1 TABLET BY MOUTH  DAILY (Patient taking differently: 12.5 mg daily. ) 90 tablet 1  . Misc Natural Products (TUMERSAID PO) Take by mouth. Taking one gummy daily- not sure the dosage    . montelukast (SINGULAIR) 10 MG tablet Take 10 mg by mouth at bedtime.    . Multiple Vitamins-Minerals (MULTIVITAMIN ADULT PO) Take 1 tablet by mouth daily.     . SUMAtriptan (IMITREX) 50 MG tablet Take 1 tablet (50 mg total) by mouth every 2 (two) hours as needed for migraine. May repeat in 2 hours if headache persists or recurs. 12 tablet 11   No current facility-administered medications for this encounter.    Allergies  Allergen Reactions  . Other Swelling    cherries    Social History   Socioeconomic History  . Marital status: Single    Spouse name: Not on file  . Number of children: 1  . Years of education: BS  . Highest education level: Not on file  Occupational History  . Occupation: Garment/textile technologist  Tobacco Use  . Smoking status: Never Smoker  . Smokeless tobacco: Never Used  Substance and Sexual Activity  . Alcohol use: Yes    Comment: occasional  . Drug use: No  . Sexual activity: Yes  Other Topics Concern  . Not on file  Social History Narrative   Lives at home with her mother and son.   Right-handed.   Occasional soda.   Social Determinants of Health   Financial Resource Strain:   . Difficulty of Paying Living Expenses:   Food Insecurity:   . Worried About Charity fundraiser in the Last Year:   . Arboriculturist in the Last Year:   Transportation Needs:   . Film/video editor (Medical):   Marland Kitchen Lack of Transportation (Non-Medical):   Physical Activity:   . Days of Exercise per Week:   . Minutes of Exercise per Session:   Stress:   . Feeling of Stress :   Social Connections:   . Frequency of Communication with Friends and Family:    . Frequency of Social Gatherings with Friends and Family:   . Attends Religious Services:   . Active Member of Clubs or Organizations:   . Attends Archivist Meetings:   Marland Kitchen Marital Status:   Intimate Partner Violence:   . Fear of Current or Ex-Partner:   . Emotionally Abused:   Marland Kitchen Physically Abused:   . Sexually Abused:      ROS- All systems are reviewed and negative except as per the HPI above.  Physical Exam: Vitals:   03/12/20 0933  BP: 116/70  Pulse: (!) 51  Weight: 88.4 kg  Height: 5\' 7"  (1.702 m)    GEN- The patient is well appearing obese female, alert and oriented x 3 today.   HEENT-head normocephalic, atraumatic, sclera  clear, conjunctiva pink, hearing intact, trachea midline. Lungs- Clear to ausculation bilaterally, normal work of breathing Heart- Regular rate and rhythm, no murmurs, rubs or gallops  GI- soft, NT, ND, + BS Extremities- no clubbing, cyanosis, or edema MS- no significant deformity or atrophy Skin- no rash or lesion Psych- euthymic mood, full affect Neuro- strength and sensation are intact   Wt Readings from Last 3 Encounters:  03/12/20 88.4 kg  02/01/20 85.3 kg  01/04/20 86.6 kg    EKG today demonstrates SB HR 51, LAD, PR 180, QRS 102, QTc 403  Echo 01/18/19 demonstrated  1. The left ventricle has hyperdynamic systolic function, with an ejection fraction of >65%. The cavity size was normal. Left ventricular diastolic parameters were normal.  2. The right ventricle has normal systolic function. The cavity was normal. There is no increase in right ventricular wall thickness.  3. The mitral valve is normal in structure.  4. The tricuspid valve is normal in structure.  5. The aortic valve is normal in structure. no stenosis of the aortic valve.  6. The aortic root and ascending aorta are normal in size and structure.  7. The interatrial septum was not assessed.  Epic records are reviewed at length today  Assessment and Plan:  1.  Paroxysmal atrial fibrillation/atrial flutter Patient appears to be maintaining SR. Continue flecainide 50 mg BID.  Continue Toprol 12.5 mg daily. No anticoagulation indicated at this time. Lifestyle modification was discussed and encouraged including regular physical activity and weight reduction.  This patients CHA2DS2-VASc Score and unadjusted Ischemic Stroke Rate (% per year) is equal to 0.6 % stroke rate/year from a score of 1  Above score calculated as 1 point each if present [CHF, HTN, DM, Vascular=MI/PAD/Aortic Plaque, Age if 65-74, or Female] Above score calculated as 2 points each if present [Age > 75, or Stroke/TIA/TE]   Follow up in the AF clinic in 6 months.    [facility] [address] [place], [place] [address] [phone] 03/12/2020 9:51 AM

## 2020-03-12 NOTE — Telephone Encounter (Signed)
Spoke with patient who states that she has been meaning to call [facility] back but forgot. She will give them a call today. I will resend patient's information and orders to [employer] so that they will be aware of need to schedule patient for study.

## 2020-03-18 NOTE — Telephone Encounter (Signed)
Patient notified of insurance approval.

## 2020-03-18 NOTE — Telephone Encounter (Addendum)
RE: [facility] sleep Study Suits, Magdalene Patricia, RN  Freada Bergeron, CMA  Great, I did not cancel the order. Thank you         ----- Message -----  From: Freada Bergeron, CMA  Sent: 03/18/2020  1:15 PM EDT  To: Chapman Moss, RN  Subject: FW: [facility] sleep Study              Elpidio Anis, I got a fax today from her insurance that they have approved her sleep study. I tried to call her but she was at the doctors office and will call me back. Don't cancel but order.  Participant ID# [id number] Reference Code: [id number]  AUTHORIZATION NUMBER: # [id number]   AUTH EFFECTIVE DATE:  03/18/20 - EXP- 06/16/20. Thanks

## 2020-03-26 ENCOUNTER — Ambulatory Visit: Payer: Managed Care, Other (non HMO) | Attending: Internal Medicine

## 2020-03-26 DIAGNOSIS — Z23 Encounter for immunization: Secondary | ICD-10-CM

## 2020-03-26 NOTE — Progress Notes (Signed)
   Covid-19 Vaccination Clinic  Name:  Janice Flores    MRN: [id number] DOB: 04-30-1976  03/26/2020  Janice Flores was observed post Covid-19 immunization for 15 minutes without incident. She was provided with Vaccine Information Sheet and instruction to access the V-Safe system.   Janice Flores was instructed to call 911 with any severe reactions post vaccine: Marland Kitchen Difficulty breathing  . Swelling of face and throat  . A fast heartbeat  . A bad rash all over body  . Dizziness and weakness   Immunizations Administered    Name Date Dose VIS Date Route   Pfizer COVID-19 Vaccine 03/26/2020  1:25 PM 0.3 mL 01/31/2019 Intramuscular   Manufacturer: [place]   Lot: [id number]   [place]: [id number]

## 2020-04-05 ENCOUNTER — Encounter (INDEPENDENT_AMBULATORY_CARE_PROVIDER_SITE_OTHER): Payer: Managed Care, Other (non HMO) | Admitting: Cardiology

## 2020-04-05 DIAGNOSIS — I4891 Unspecified atrial fibrillation: Secondary | ICD-10-CM | POA: Diagnosis not present

## 2020-04-10 ENCOUNTER — Ambulatory Visit: Payer: Managed Care, Other (non HMO)

## 2020-04-10 NOTE — Procedures (Signed)
    Sleep Study Report  Patient Information Name: Janice Flores  ID: [id number] Birth Date: 17-Jan-1976  Age: 44  Gender: Female BMI: 29.4 (W=187 lb, H=5' 7'') Study Date: 04/05/2020 Referring Physician:  Fransico Him, MD  TEST DESCRIPTION: Home sleep apnea testing was completed using the WatchPat, a Type 1 device, utilizing peripheral arterial tonometry (PAT), chest movement, actigraphy, pulse oximetry, pulse rate, body position and snore. AHI was calculated with apnea and hypopnea using valid sleep time as the denominator. RDI includes apneas, hypopneas, and RERAs. The data acquired and the scoring of sleep and all associated events were performed in accordance with the recommended standards and specifications as outlined in the AASM Manual for the Scoring of Sleep and Associated Events 2.2.0 (2015).  FINDINGS: 1. No evidence of Obstructive Sleep Apnea with AHI /0.9hr. 2. No Central Sleep Apnea. 3. Oxygen desaturations as low as 93%. 4. Mild snoring was present. O2 sats were < 88% for 36minutes. 5. Total sleep time was 7 hrs and 1 min. 6. 28.5% of total sleep time was spent in REM sleep. 7. Mildly prolonged sleep onset latency at 31 min. 8. Shortened REM sleep onset latency at 41 min. 9. Total awakenings were 8.  DIAGNOSIS: Normal study with no significant sleep disordered breathing.  Recommendations 1. Normal study with no significant sleep disordered breathing.  2. Healthy sleep recommendations include: adequate nightly sleep (normal 7-9 hrs/night), avoidance of caffeine afternoon and alcohol near bedtime, and maintaining a sleep environment that is cool, dark and quiet.  3. Weight loss for overweight patients is recommended.  4. Snoring recommendations include: weight loss where appropriate, side sleeping, and avoidance of alcohol before bed.  5. Operation of motor vehicle or dangerous equipment must be avoided when feeling drowsy, excessively sleepy, or mentally  fatigued.  6. An ENT consultation which may be useful for specific causes of and possible treatment of bothersome snoring .  7. Weight loss may be of benefit in reducing the severity of snoring .   Report prepared by: Signature:  Fransico Him, MD [facility], Diplomate ABSM   Electronically Signed: Apr 10, 2020

## 2020-04-17 ENCOUNTER — Ambulatory Visit: Payer: Managed Care, Other (non HMO) | Admitting: Internal Medicine

## 2020-04-17 ENCOUNTER — Other Ambulatory Visit: Payer: Self-pay

## 2020-04-17 ENCOUNTER — Telehealth: Payer: Self-pay | Admitting: Cardiology

## 2020-04-17 ENCOUNTER — Encounter: Payer: Self-pay | Admitting: Internal Medicine

## 2020-04-17 VITALS — BP 130/70 | HR 51 | Ht 67.0 in | Wt 188.8 lb

## 2020-04-17 DIAGNOSIS — R079 Chest pain, unspecified: Secondary | ICD-10-CM

## 2020-04-17 DIAGNOSIS — I480 Paroxysmal atrial fibrillation: Secondary | ICD-10-CM | POA: Diagnosis not present

## 2020-04-17 NOTE — Telephone Encounter (Signed)
Pt c/o of Chest Pain: STAT if CP now or developed within 24 hours  1. Are you having CP right now? Yes, for the last 25 mins  2. Are you experiencing any other symptoms (ex. SOB, nausea, vomiting, sweating)? No   3. How long have you been experiencing CP? Just started 25 mins ago   4. Is your CP continuous or coming and going? continuous  5. Have you taken Nitroglycerin? No. She check her HR she states is 47.   ?

## 2020-04-17 NOTE — Progress Notes (Signed)
PCP: Donald Prose, MD Primary Cardiologist: Dr Radford Pax Primary EP: Dr Justice Britain Janice Flores is a 44 y.o. female who presents today for acute cardiology visit for chest pain. She follows with the AF clinic and Dr Radford Pax.  She has a h/o atypical chest with extensive workup in the past.  She reports having chest pressure this am, lasting an hour without associated palpitations or SOB.  This has since resolved. She is very active.  She denies exertional symptoms. Today, she denies symptoms of palpitations,   lower extremity edema, dizziness, presyncope, or syncope.  The patient is otherwise without complaint today.   Past Medical History:  Diagnosis Date  . Anemia   . Asthma   . Atrial fibrillation with rapid ventricular response ([place]) 01/12/2019  . Bradycardia 08/13/2016  . Headache   . HSV infection   . Hypothyroidism   . Iron deficiency   . Migraine   . Tachycardia    intermittent tachycardia  . Tingling   . Vaginal Pap smear, abnormal    colposcopy   . Vitamin D deficiency    Past Surgical History:  Procedure Laterality Date  . CESAREAN SECTION N/A 04/21/2015   Procedure: CESAREAN SECTION;  Surgeon: Everett Graff, MD;  Location: [facility];  Service: Obstetrics;  Laterality: N/A;  . removal of polyp from uterus      ROS- all systems are reviewed and negatives except as per HPI above  Current Outpatient Medications  Medication Sig Dispense Refill  . Ascorbic Acid (VITAMIN C) 1000 MG tablet Take 2,000 mg by mouth daily.    . cetirizine (ZYRTEC) 10 MG chewable tablet Chew 10 mg by mouth as needed.     . Cholecalciferol (VITAMIN D-3 PO) Take by mouth daily. Taking 1000mg  daily    . ferrous sulfate 325 (65 FE) MG EC tablet Take 325 mg by mouth daily. Taking one tablet daily during cycle    . flecainide (TAMBOCOR) 50 MG tablet Take 1 tablet (50 mg total) by mouth 2 (two) times daily. 180 tablet 2  . Fluticasone-Salmeterol (ADVAIR DISKUS) 250-50 MCG/DOSE AEPB Inhale 1 puff into  the lungs 2 (two) times daily. 60 each 5  . levothyroxine (SYNTHROID, LEVOTHROID) 75 MCG tablet Take 75 mcg by mouth daily before breakfast.    . metoprolol succinate (TOPROL-XL) 25 MG 24 hr tablet TAKE 1 TABLET BY MOUTH  DAILY 90 tablet 1  . Misc Natural Products (TUMERSAID PO) Take by mouth. Taking one gummy daily- not sure the dosage    . montelukast (SINGULAIR) 10 MG tablet Take 10 mg by mouth at bedtime.    . Multiple Vitamins-Minerals (MULTIVITAMIN ADULT PO) Take 1 tablet by mouth daily.     . SUMAtriptan (IMITREX) 50 MG tablet Take 1 tablet (50 mg total) by mouth every 2 (two) hours as needed for migraine. May repeat in 2 hours if headache persists or recurs. 12 tablet 11  . Vitamin D, Ergocalciferol, (DRISDOL) 1.25 MG (50000 UNIT) CAPS capsule Take 50,000 Units by mouth once a week.     No current facility-administered medications for this visit.    Physical Exam: Vitals:   04/17/20 1506  BP: 130/70  Pulse: (!) 51  SpO2: 99%  Weight: 188 lb 12.8 oz (85.6 kg)  Height: 5\' 7"  (1.702 m)    GEN- The patient is well appearing, alert and oriented x 3 today.   Head- normocephalic, atraumatic Eyes-  Sclera clear, conjunctiva pink Ears- hearing intact Oropharynx- clear Lungs-  normal work  of breathing Heart- Regular rate and rhythm  GI- soft  Extremities- no clubbing, cyanosis, or edema  Wt Readings from Last 3 Encounters:  04/17/20 188 lb 12.8 oz (85.6 kg)  03/12/20 194 lb 12.8 oz (88.4 kg)  02/01/20 188 lb (85.3 kg)    EKG tracing ordered today is personally reviewed and shows sinus, no ischemic symptoms  Cardiac CT and echo from 2020 are reviewed Dr Landis Gandy notes are reviewed  Assessment and Plan:  1. Atypical chest pain Resolved No ischemic symptoms Follow-up with Dr Radford Pax  2. Paroxysmal atrial fibrillation Remains in normal rhythm with flecainide  Follow-up with Dr Radford Pax as scheduled  Risks, benefits and potential toxicities for medications prescribed  and/or refilled reviewed with patient today.   Thompson Grayer MD, [facility] 04/17/2020 3:20 PM

## 2020-04-17 NOTE — Telephone Encounter (Signed)
Pt states she was just sitting down and suddenly developed CP about 25 mins ago.  States it is in the center to left chest.  Does not radiate into left arm.  Checked HR on Apple watch and it was 44.  Did EKG on Apple watch and it showed HR below 47.  Denies jaw pain.  Asked pt if she normally knows when she is in AF.  States she does and this discomfort does not feel same as her AF.  Denies lightheadedness, dizziness, sweating, vision disturbances or other issues.  Pain is not crushing or severe.  Advised pt I will try to reach out to Dr. Radford Pax but if she was unavailable I would go to DOD and call her back.  Pt appreciative for call.   Spoke with Dr. Rayann Heman, DOD.  Ok to schedule pt to come in and see him today.  If worsening of CP, go to ER.  Spoke with pt and reviewed recommendations.  She states pain has improved but not fully resolved.  Scheduled pt to come in at 2:45pm to see Dr. Rayann Heman and advised if CP worsens, head to ER.  Pt verbalized understanding and was in agreement with this plan.

## 2020-04-17 NOTE — Patient Instructions (Signed)
Medication Instructions:  Your physician recommends that you continue on your current medications as directed. Please refer to the Current Medication list given to you today.  *If you need a refill on your cardiac medications before your next appointment, please call your pharmacy*   Lab Work: None ordered   Testing/Procedures: None ordered   Follow-Up: At [facility], you and your health needs are our priority.  As part of our continuing mission to provide you with exceptional heart care, we have created designated Provider Care Teams.  These Care Teams include your primary Cardiologist (physician) and Advanced Practice Providers (APPs -  Physician Assistants and Nurse Practitioners) who all work together to provide you with the care you need, when you need it.  We recommend signing up for the patient portal called "MyChart".  Sign up information is provided on this After Visit Summary.  MyChart is used to connect with patients for Virtual Visits (Telemedicine).  Patients are able to view lab/test results, encounter notes, upcoming appointments, etc.  Non-urgent messages can be sent to your provider as well.   To learn more about what you can do with MyChart, go to [email or web address].    Your next appointment:   As scheduled  The format for your next appointment:   In Person  Provider:   AFib clinic   Thank you for choosing [facility]!!     Other Instructions

## 2020-04-18 ENCOUNTER — Telehealth: Payer: Self-pay | Admitting: *Deleted

## 2020-04-18 NOTE — Telephone Encounter (Signed)
Informed patient of sleep study results and patient understanding was verbalized. Patient understands her sleep study showed no significant sleep apnea.   Pt is aware and agreeable to normal results.  

## 2020-04-18 NOTE — Addendum Note (Signed)
Addended by: Rose Phi on: 04/18/2020 01:27 PM   Modules accepted: Orders

## 2020-04-18 NOTE — Telephone Encounter (Signed)
-----   Message from Sueanne Margarita, MD sent at 04/10/2020 11:06 PM EDT ----- Please let patient know that sleep study showed no significant sleep apnea.

## 2020-05-13 ENCOUNTER — Ambulatory Visit: Payer: Managed Care, Other (non HMO) | Admitting: Neurology

## 2020-05-13 ENCOUNTER — Encounter: Payer: Self-pay | Admitting: Neurology

## 2020-05-13 ENCOUNTER — Other Ambulatory Visit: Payer: Self-pay

## 2020-05-13 VITALS — BP 112/69 | HR 54 | Ht 64.0 in | Wt 184.0 lb

## 2020-05-13 DIAGNOSIS — IMO0002 Reserved for concepts with insufficient information to code with codable children: Secondary | ICD-10-CM

## 2020-05-13 DIAGNOSIS — G43709 Chronic migraine without aura, not intractable, without status migrainosus: Secondary | ICD-10-CM | POA: Diagnosis not present

## 2020-05-13 NOTE — Progress Notes (Signed)
PATIENT: Janice Flores, Janice Flores  REASON FOR VISIT: follow up HISTORY FROM: patient  HISTORY OF PRESENT ILLNESS: Today 05/13/20  HISTORY  Janice Allington Crumdyis a 44 year old right-handed female alone at today's clinical visit, seen in refer by her primary care physician Dr.Vyvyan Sun for evaluation of headacheson August 10, 2019, I saw her previously on November 06 2015 for migraine  I have reviewed and summarize her most recent office note, she had a history of hypothyroidism, intermittent asthma, iron deficiency anemia, vitamin D deficiency, idiopathic scoliosis of lumbar region, history of herpes simplex type II, PVCs  She started to have headaches since 2013, she reported sudden onset transient sharp headache lasting for few seconds with sudden positional change, for a while, it has improved, but since October 2016, she began to have frequent occurrence, multiple episodes in a day, usually happened with sudden positional change, turning her head, bending over, getting up from bed  In addition she also had a history of migraine headaches, above-mentioned headache is different from her typical migraine, her typical migraine left frontal behind eye severe pounding headache with associated light noise sensitivity, nauseous, lasting for 1 day,   She is currently still nursing her 62-month-old son, tried Tylenol for migraine with limited help, she was started on preventive medication magnesium oxide, riboflavin, which has been helpful, 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, no evidence of compression, headache was much improved, has lost follow-up,  She returns today for worsening headache since March 2020  Her headache is usually triggered by strong smells, such as perfume, cleaning agent, exertion, sudden head movement, her typical headache left parietal retro-orbital region pounding headache with associated light, noise sensitivity, nauseous, ringing in  ears, lasting for few hours, she also complains of intermittent left face, left finger paresthesia  She is having migraine headaches 3-4 times each week, tried Imitrex without significant improvement,  Update October 26, 2019, She is doing well, still has intermittent left lower face numbness, sometimes upper extremity paresthesia, only lasting for few minutes, no weakness,  She has migraine headaches every couple months, responding well to Imitrex  Personally reviewed MRI of the brain in September 2020, partially empty sella, no intracranial abnormality   Update November 13, 2019 SS: She was recently seen by Dr. Krista Blue 3 weeks ago.  She reports she has continued to do well, has had less frequent paresthesia and headaches.  Has had only had 2 migraines in September, reports excellent benefit with Imitrex.  She is working with cardiology to decrease her dose of metoprolol.  She thinks the decreased dose has resulted in less episodes of intermittent left lower face numbness, paresthesia to left upper extremity.  She is now taking metoprolol 12.5 mg daily.  The frequency of paresthesias is happening less than once a week, is much improved.  She presents today for evaluation unaccompanied.  She has not actually started nortriptyline, has not needed up to this point.  Update May 13, 2020 SS: Headaches remain overall well controlled, at worse 1-2 a month, recently last 2 weeks, has had 3 headaches, brought on by weather change.  Headaches start under the left eye, as pressure, will gradually spread up to the top of head (once at this point is worst).  Will take Imitrex with good benefit.  Occasionally, will get left sided facial paresthesia, numbness-not nearly as significant or frequent, previously thought to be related to higher dose metoprolol, now taking 12.5 mg daily.  Never started nortriptyline. Headaches  triggered by smells. Has 55 year old son.   REVIEW OF SYSTEMS: Out of a complete 14 system  review of symptoms, the patient complains only of the following symptoms, and all other reviewed systems are negative.  Headache  ALLERGIES: Allergies  Allergen Reactions  . Other Swelling    cherries    HOME MEDICATIONS: Outpatient Medications Prior to Visit  Medication Sig Dispense Refill  . Ascorbic Acid (VITAMIN C) 1000 MG tablet Take 2,000 mg by mouth daily.    . cetirizine (ZYRTEC) 10 MG chewable tablet Chew 10 mg by mouth as needed.     . ferrous sulfate 325 (65 FE) MG EC tablet Take 325 mg by mouth daily. Taking one tablet daily during cycle    . flecainide (TAMBOCOR) 50 MG tablet Take 1 tablet (50 mg total) by mouth 2 (two) times daily. 180 tablet 2  . levothyroxine (SYNTHROID, LEVOTHROID) 75 MCG tablet Take 75 mcg by mouth daily before breakfast.    . metoprolol succinate (TOPROL-XL) 25 MG 24 hr tablet TAKE 1 TABLET BY MOUTH  DAILY 90 tablet 1  . Misc Natural Products (TUMERSAID PO) Take by mouth. Taking one gummy daily- not sure the dosage    . montelukast (SINGULAIR) 10 MG tablet Take 10 mg by mouth at bedtime.    . Multiple Vitamins-Minerals (MULTIVITAMIN ADULT PO) Take 1 tablet by mouth daily.     . SUMAtriptan (IMITREX) 50 MG tablet Take 1 tablet (50 mg total) by mouth every 2 (two) hours as needed for migraine. May repeat in 2 hours if headache persists or recurs. 12 tablet 11  . Vitamin D, Ergocalciferol, (DRISDOL) 1.25 MG (50000 UNIT) CAPS capsule Take 50,000 Units by mouth once a week.    . Cholecalciferol (VITAMIN D-3 PO) Take by mouth daily. Taking 1000mg  daily    . Fluticasone-Salmeterol (ADVAIR DISKUS) 250-50 MCG/DOSE AEPB Inhale 1 puff into the lungs 2 (two) times daily. 60 each 5   No facility-administered medications prior to visit.    PAST MEDICAL HISTORY: Past Medical History:  Diagnosis Date  . Anemia   . Asthma   . Atrial fibrillation with rapid ventricular response ([place]) 01/12/2019  . Bradycardia 08/13/2016  . Headache   . HSV infection   .  Hypothyroidism   . Iron deficiency   . Migraine   . Tachycardia    intermittent tachycardia  . Tingling   . Vaginal Pap smear, abnormal    colposcopy   . Vitamin D deficiency     PAST SURGICAL HISTORY: Past Surgical History:  Procedure Laterality Date  . CESAREAN SECTION N/A 04/21/2015   Procedure: CESAREAN SECTION;  Surgeon: Everett Graff, MD;  Location: [facility];  Service: Obstetrics;  Laterality: N/A;  . removal of polyp from uterus      FAMILY HISTORY: Family History  Problem Relation Age of Onset  . Hypertension Father   . Hypercholesterolemia Mother   . Sarcoidosis Mother   . Asthma Brother   . Sickle cell trait Brother   . Breast cancer Neg Hx     SOCIAL HISTORY: Social History   Socioeconomic History  . Marital status: Single    Spouse name: Not on file  . Number of children: 1  . Years of education: BS  . Highest education level: Not on file  Occupational History  . Occupation: Garment/textile technologist  Tobacco Use  . Smoking status: Never Smoker  . Smokeless tobacco: Never Used  Substance and Sexual Activity  . Alcohol use: Yes  Comment: occasional  . Drug use: No  . Sexual activity: Yes  Other Topics Concern  . Not on file  Social History Narrative   Lives at home with her mother and son.   Right-handed.   Occasional soda.   Social Determinants of Health   Financial Resource Strain:   . Difficulty of Paying Living Expenses:   Food Insecurity:   . Worried About Charity fundraiser in the Last Year:   . Arboriculturist in the Last Year:   Transportation Needs:   . Film/video editor (Medical):   Marland Kitchen Lack of Transportation (Non-Medical):   Physical Activity:   . Days of Exercise per Week:   . Minutes of Exercise per Session:   Stress:   . Feeling of Stress :   Social Connections:   . Frequency of Communication with Friends and Family:   . Frequency of Social Gatherings with Friends and Family:   . Attends Religious Services:   .  Active Member of Clubs or Organizations:   . Attends Archivist Meetings:   Marland Kitchen Marital Status:   Intimate Partner Violence:   . Fear of Current or Ex-Partner:   . Emotionally Abused:   Marland Kitchen Physically Abused:   . Sexually Abused:     PHYSICAL EXAM  Vitals:   05/13/20 0944  BP: 112/69  Pulse: (!) 54  Weight: 184 lb (83.5 kg)  Height: 5\' 4"  (1.626 m)   Body mass index is 31.58 kg/m.  Generalized: Well developed, in no acute distress   Neurological examination  Mentation: Alert oriented to time, place, history taking. Follows all commands speech and language fluent Cranial nerve II-XII: Pupils were equal round reactive to light. Extraocular movements were full, visual field were full on confrontational test. Facial sensation and strength were normal. Head turning and shoulder shrug  were normal and symmetric. Motor: The motor testing reveals 5 over 5 strength of all 4 extremities. Good symmetric motor tone is noted throughout.  Sensory: Sensory testing is intact to soft touch on all 4 extremities. No evidence of extinction is noted.  Coordination: Cerebellar testing reveals good finger-nose-finger and heel-to-shin bilaterally.  Gait and station: Gait is normal.  Reflexes: Deep tendon reflexes are symmetric and normal bilaterally.   DIAGNOSTIC DATA (LABS, IMAGING, TESTING) - I reviewed patient records, labs, notes, testing and imaging myself where available.  Lab Results  Component Value Date   WBC 4.2 04/03/2019   HGB 12.9 04/03/2019   HCT 40.3 04/03/2019   MCV 92.9 04/03/2019   PLT 370 04/03/2019      Component Value Date/Time   NA 136 04/03/2019 1851   NA 137 04/05/2017 1034   K 4.2 04/03/2019 1851   CL 104 04/03/2019 1851   CO2 24 04/03/2019 1851   GLUCOSE 87 04/03/2019 1851   BUN 15 04/03/2019 1851   BUN 9 04/05/2017 1034   CREATININE 0.82 04/03/2019 1851   CALCIUM 9.1 04/03/2019 1851   PROT 8.1 01/11/2019 1127   PROT 7.6 04/05/2017 1034   ALBUMIN 4.3  01/11/2019 1127   ALBUMIN 4.6 04/05/2017 1034   AST 13 (L) 01/11/2019 1127   ALT 11 01/11/2019 1127   ALKPHOS 34 (L) 01/11/2019 1127   BILITOT 1.0 01/11/2019 1127   BILITOT 1.3 (H) 04/05/2017 1034   GFRNONAA >60 04/03/2019 1851   GFRAA >60 04/03/2019 1851   No results found for: CHOL, HDL, LDLCALC, LDLDIRECT, TRIG, CHOLHDL No results found for: HGBA1C No results found for:  [id number] Lab Results  Component Value Date   TSH 3.170 01/12/2019   ASSESSMENT AND PLAN 44 y.o. year old female  has a past medical history of Anemia, Asthma, Atrial fibrillation with rapid ventricular response ([place]) (01/12/2019), Bradycardia (08/13/2016), Headache, HSV infection, Hypothyroidism, Iron deficiency, Migraine, Tachycardia, Tingling, Vaginal Pap smear, abnormal, and Vitamin D deficiency. here with:  1.  Worsening headaches 2.  Worsening left side paresthesia -Headaches overall, well controlled, few more recently due to weather change -MRI of the brain showed no significant abnormality -Potential interaction of flecainide and nortriptyline (never started) -If headaches continue, may consider preventive medication Topamax -Will remain on Imitrex 50 mg as needed for acute headache -Follow-up in 6 months or sooner if needed  I spent 20 minutes of face-to-face and non-face-to-face time with patient.  This included previsit chart review, lab review, study review, order entry, electronic health record documentation, patient education.    Butler Denmark, AGNP-C, DNP 05/13/2020, 9:59 AM [facility] [address] [place], [place] [address] [phone]

## 2020-05-13 NOTE — Patient Instructions (Signed)
If headaches increase consider Topamax  Take Imitrex as needed  See you back in 6 months

## 2020-05-25 ENCOUNTER — Other Ambulatory Visit (HOSPITAL_COMMUNITY): Payer: Self-pay | Admitting: Physician Assistant

## 2020-07-16 ENCOUNTER — Other Ambulatory Visit: Payer: Self-pay | Admitting: Cardiology

## 2020-07-26 ENCOUNTER — Telehealth (HOSPITAL_COMMUNITY): Payer: Self-pay | Admitting: *Deleted

## 2020-07-26 NOTE — Telephone Encounter (Signed)
Patient woke up this AM in afib HRs from 80-107 Bp 115/76 Discussed with Adline Peals PA can take extra 25mg  of flecainide at lunch then this evening extra 12.5mg  of metoprolol if still out of rhythm. She can repeat tomorrow if needed.  Pt is stable on call - slight shortness of breath - ER precautions were reviewed.  Pt to call on Monday if still out rhythm.

## 2020-09-11 ENCOUNTER — Ambulatory Visit (HOSPITAL_COMMUNITY)
Admission: RE | Admit: 2020-09-11 | Discharge: 2020-09-11 | Disposition: A | Discharge status: Home / Self Care | Payer: Managed Care, Other (non HMO) | Source: Ambulatory Visit | Attending: Physician Assistant | Admitting: Physician Assistant

## 2020-09-11 ENCOUNTER — Encounter (HOSPITAL_COMMUNITY): Payer: Self-pay | Admitting: Physician Assistant

## 2020-09-11 ENCOUNTER — Other Ambulatory Visit: Payer: Self-pay

## 2020-09-11 VITALS — BP 114/68 | HR 64 | Ht 64.0 in | Wt 190.0 lb

## 2020-09-11 DIAGNOSIS — E039 Hypothyroidism, unspecified: Secondary | ICD-10-CM | POA: Insufficient documentation

## 2020-09-11 DIAGNOSIS — Z79899 Other long term (current) drug therapy: Secondary | ICD-10-CM | POA: Diagnosis not present

## 2020-09-11 DIAGNOSIS — I480 Paroxysmal atrial fibrillation: Secondary | ICD-10-CM

## 2020-09-11 DIAGNOSIS — I4892 Unspecified atrial flutter: Secondary | ICD-10-CM | POA: Diagnosis not present

## 2020-09-11 DIAGNOSIS — Z79890 Hormone replacement therapy (postmenopausal): Secondary | ICD-10-CM | POA: Insufficient documentation

## 2020-09-11 DIAGNOSIS — E559 Vitamin D deficiency, unspecified: Secondary | ICD-10-CM | POA: Diagnosis not present

## 2020-09-11 DIAGNOSIS — Z8249 Family history of ischemic heart disease and other diseases of the circulatory system: Secondary | ICD-10-CM | POA: Diagnosis not present

## 2020-09-11 MED ORDER — FLECAINIDE ACETATE 50 MG PO TABS: 50.0000 mg | ORAL_TABLET | Freq: Two times a day (BID) | ORAL | 6 refills | Status: DC

## 2020-09-11 MED ORDER — METOPROLOL SUCCINATE ER 25 MG PO TB24: 25.0000 mg | ORAL_TABLET | Freq: Every day | ORAL | 3 refills | Status: DC

## 2020-09-11 NOTE — Progress Notes (Signed)
Primary Care Physician: Donald Prose, MD Primary Cardiologist: Dr Radford Pax Primary Electrophysiologist: none Referring Physician: Dr Maren Beach Janice Flores is a 44 y.o. female with a history of asthma, hypothyroidism, and paroxysmal atrial fibrillation who presents for follow up in the [facility]. Patient reports that in February she woke in the middle of the night with palpations and SOB. She presented to the ER and was found to be in afib with RVR. She was given metoprolol on discharge and patient reports she spontaneously converted to SR later that day. She had another episode of heart racing on 04/03/19 and again went to the ER and was found to be in atrial flutter with variable conduction. Cardioversion was not pursued at that time. Patient feels that she has been persistently out of rhythm since her ER visit with nearly constant symptoms of palpitations, periodic lightheadedness, and SOB on exertion. Her Apple Watch has shown labile HR up to 140s. She denies snoring or significant alcohol use. There were no triggers during each of these episodes that the patient could identify. She was started on flecainide on 05/22/19.  On follow up today, patient reports that she has done well since her last visit. She did have one episode of afib 07/26/20 which resolved with extra doses of flecainide and metoprolol. Her sleep study was negative for OSA.  Today, she denies symptoms of chest pain, shortness of breath, orthopnea, PND, lower extremity edema, dizziness, presyncope, syncope, snoring, daytime somnolence, bleeding, or neurologic sequela. The patient is tolerating medications without difficulties and is otherwise without complaint today.    Atrial Fibrillation Risk Factors:  she does not have symptoms or diagnosis of sleep apnea. Negative sleep study. she does not have a history of rheumatic fever. she does not have a history of alcohol use. The patient does  have a history of early familial atrial fibrillation or other arrhythmias. Mother has afib.  she has a BMI of Body mass index is 32.61 kg/m.Marland Kitchen Filed Weights   09/11/20 0944  Weight: 86.2 kg    Family History  Problem Relation Age of Onset  . Hypertension Father   . Hypercholesterolemia Mother   . Sarcoidosis Mother   . Asthma Brother   . Sickle cell trait Brother   . Breast cancer Neg Hx      Atrial Fibrillation Management history:  Previous antiarrhythmic drugs: flecainide Previous cardioversions: none Previous ablations: none CHADS2VASC score: 1 Anticoagulation history: Eliquis   Past Medical History:  Diagnosis Date  . Anemia   . Asthma   . Atrial fibrillation with rapid ventricular response ([place]) 01/12/2019  . Bradycardia 08/13/2016  . Headache   . HSV infection   . Hypothyroidism   . Iron deficiency   . Migraine   . Tachycardia    intermittent tachycardia  . Tingling   . Vaginal Pap smear, abnormal    colposcopy   . Vitamin D deficiency    Past Surgical History:  Procedure Laterality Date  . CESAREAN SECTION N/A 04/21/2015   Procedure: CESAREAN SECTION;  Surgeon: Everett Graff, MD;  Location: [facility];  Service: Obstetrics;  Laterality: N/A;  . removal of polyp from uterus      Current Outpatient Medications  Medication Sig Dispense Refill  . Ascorbic Acid (VITAMIN C) 1000 MG tablet Take 2,000 mg by mouth daily.    . cetirizine (ZYRTEC) 10 MG chewable tablet Chew 10 mg by mouth as needed.     . flecainide (TAMBOCOR) 50  MG tablet TAKE 1 TABLET BY MOUTH TWICE A DAY 60 tablet 3  . levothyroxine (SYNTHROID, LEVOTHROID) 75 MCG tablet Take 75 mcg by mouth daily before breakfast.    . metoprolol succinate (TOPROL-XL) 25 MG 24 hr tablet TAKE 1 TABLET BY MOUTH EVERY DAY 90 tablet 3  . Misc Natural Products (TUMERSAID PO) Take by mouth. Taking one gummy daily- not sure the dosage    . montelukast (SINGULAIR) 10 MG tablet Take 10 mg by mouth at bedtime.    . Multiple  Vitamins-Minerals (MULTIVITAMIN ADULT PO) Take 1 tablet by mouth daily.     . SUMAtriptan (IMITREX) 50 MG tablet Take 1 tablet (50 mg total) by mouth every 2 (two) hours as needed for migraine. May repeat in 2 hours if headache persists or recurs. 12 tablet 11  . Vitamin D, Ergocalciferol, (DRISDOL) 1.25 MG (50000 UNIT) CAPS capsule Take 50,000 Units by mouth once a week.     No current facility-administered medications for this encounter.    Allergies  Allergen Reactions  . Other Swelling    cherries    Social History   Socioeconomic History  . Marital status: Single    Spouse name: Not on file  . Number of children: 1  . Years of education: BS  . Highest education level: Not on file  Occupational History  . Occupation: Garment/textile technologist  Tobacco Use  . Smoking status: Never Smoker  . Smokeless tobacco: Never Used  Vaping Use  . Vaping Use: Never used  Substance and Sexual Activity  . Alcohol use: Yes    Comment: occasional  . Drug use: No  . Sexual activity: Yes  Other Topics Concern  . Not on file  Social History Narrative   Lives at home with her mother and son.   Right-handed.   Occasional soda.   Social Determinants of Health   Financial Resource Strain:   . Difficulty of Paying Living Expenses: Not on file  Food Insecurity:   . Worried About Charity fundraiser in the Last Year: Not on file  . Ran Out of Food in the Last Year: Not on file  Transportation Needs:   . Lack of Transportation (Medical): Not on file  . Lack of Transportation (Non-Medical): Not on file  Physical Activity:   . Days of Exercise per Week: Not on file  . Minutes of Exercise per Session: Not on file  Stress:   . Feeling of Stress : Not on file  Social Connections:   . Frequency of Communication with Friends and Family: Not on file  . Frequency of Social Gatherings with Friends and Family: Not on file  . Attends Religious Services: Not on file  . Active Member of Clubs or  Organizations: Not on file  . Attends Archivist Meetings: Not on file  . Marital Status: Not on file  Intimate Partner Violence:   . Fear of Current or Ex-Partner: Not on file  . Emotionally Abused: Not on file  . Physically Abused: Not on file  . Sexually Abused: Not on file     ROS- All systems are reviewed and negative except as per the HPI above.  Physical Exam: Vitals:   09/11/20 0944  BP: 114/68  Pulse: 64  Weight: 86.2 kg  Height: 5\' 4"  (1.626 m)    GEN- The patient is well appearing obese female, alert and oriented x 3 today.   HEENT-head normocephalic, atraumatic, sclera clear, conjunctiva pink, hearing intact, trachea midline.  Lungs- Clear to ausculation bilaterally, normal work of breathing Heart- Regular rate and rhythm, no murmurs, rubs or gallops  GI- soft, NT, ND, + BS Extremities- no clubbing, cyanosis, or edema MS- no significant deformity or atrophy Skin- no rash or lesion Psych- euthymic mood, full affect Neuro- strength and sensation are intact   Wt Readings from Last 3 Encounters:  09/11/20 86.2 kg  05/13/20 83.5 kg  04/17/20 85.6 kg    EKG today demonstrates SR HR 64, PR 180, QRS 94, QTc 433  Echo 01/18/19 demonstrated  1. The left ventricle has hyperdynamic systolic function, with an ejection fraction of >65%. The cavity size was normal. Left ventricular diastolic parameters were normal.  2. The right ventricle has normal systolic function. The cavity was normal. There is no increase in right ventricular wall thickness.  3. The mitral valve is normal in structure.  4. The tricuspid valve is normal in structure.  5. The aortic valve is normal in structure. no stenosis of the aortic valve.  6. The aortic root and ascending aorta are normal in size and structure.  7. The interatrial septum was not assessed.  Epic records are reviewed at length today  Assessment and Plan:  1. Paroxysmal atrial fibrillation/atrial flutter Patient  doing well with infrequent afib episodes. Continue flecainide 50 mg BID.  Continue Toprol 12.5 mg daily. No anticoagulation indicated at this time. We did briefly discuss ablation if her afib should become more persistent. She is satisfied with her present therapy for now.  This patients CHA2DS2-VASc Score and unadjusted Ischemic Stroke Rate (% per year) is equal to 0.6 % stroke rate/year from a score of 1  Above score calculated as 1 point each if present [CHF, HTN, DM, Vascular=MI/PAD/Aortic Plaque, Age if 65-74, or Female] Above score calculated as 2 points each if present [Age > 75, or Stroke/TIA/TE]   Follow up in the AF clinic in 6 months.    [facility] [address] [place], [place] [address] [phone] 09/11/2020 9:49 AM

## 2020-10-08 NOTE — Progress Notes (Signed)
I have reviewed and agreed above plan. 

## 2020-11-12 ENCOUNTER — Encounter: Payer: Self-pay | Admitting: Neurology

## 2020-11-12 ENCOUNTER — Ambulatory Visit: Payer: Managed Care, Other (non HMO) | Admitting: Neurology

## 2020-11-12 VITALS — BP 106/58 | HR 59 | Ht 67.0 in | Wt 186.0 lb

## 2020-11-12 DIAGNOSIS — G43709 Chronic migraine without aura, not intractable, without status migrainosus: Secondary | ICD-10-CM | POA: Diagnosis not present

## 2020-11-12 MED ORDER — SUMATRIPTAN SUCCINATE 50 MG PO TABS: 50.0000 mg | ORAL_TABLET | ORAL | 11 refills | Status: DC | PRN

## 2020-11-12 MED ORDER — NURTEC 75 MG PO TBDP: 75.0000 mg | ORAL_TABLET | ORAL | 11 refills | Status: DC | PRN

## 2020-11-12 NOTE — Progress Notes (Signed)
PATIENT: Janice Flores DOB: 05-17-1976  REASON FOR VISIT: follow up HISTORY FROM: patient  HISTORY OF PRESENT ILLNESS: Today 11/12/20  HISTORY Janice Druckenmiller Crumdyis a 43 year old right-handed female alone at today's clinical visit, seen in refer by her primary care physician Dr.Vyvyan Sun for evaluation of headacheson August 10, 2019, I saw her previously on November 06 2015 for migraine  I have reviewed and summarize her most recent office note, she had a history of hypothyroidism, intermittent asthma, iron deficiency anemia, vitamin D deficiency, idiopathic scoliosis of lumbar region, history of herpes simplex type II, PVCs  She started to have headaches since 2013, she reported sudden onset transient sharp headache lasting for few seconds with sudden positional change, for a while, it has improved, but since October 2016, she began to have frequent occurrence, multiple episodes in a day, usually happened with sudden positional change, turning her head, bending over, getting up from bed  In addition she also had a history of migraine headaches, above-mentioned headache is different from her typical migraine, her typical migraine left frontal behind eye severe pounding headache with associated light noise sensitivity, nauseous, lasting for 1 day,   She is currently still nursing her 6-month-old son, tried Tylenol for migraine with limited help, she was started on preventive medication magnesium oxide, riboflavin, which has been helpful, 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, no evidence of compression, headache was much improved, has lost follow-up,  She returns today for worsening headache since March 2020  Her headache is usually triggered by strong smells, such as perfume, cleaning agent, exertion, sudden head movement, her typical headache left parietal retro-orbital region pounding headache with associated light, noise sensitivity, nauseous, ringing in  ears, lasting for few hours, she also complains of intermittent left face, left finger paresthesia  She is having migraine headaches 3-4 times each week, tried Imitrex without significant improvement,  Update October 26, 2019, She is doing well, still has intermittent left lower face numbness, sometimes upper extremity paresthesia, only lasting for few minutes, no weakness,  She has migraine headaches every couple months, responding well to Imitrex  Personally reviewed MRI of the brain in September 2020, partially empty sella, no intracranial abnormality  Update December 7, 2020SS:She was recently seen by Dr. Krista Blue 3 weeks ago.She reports she has continued to do well, has had less frequent paresthesia and headaches. Has hadonly had 2 migraines in September, reports excellent benefit with Imitrex. She is working with cardiology to decrease her dose of metoprolol. She thinks the decreaseddose has resulted in less episodes of intermittent left lower face numbness, paresthesia to left upper extremity. She is now taking metoprolol 12.5 mg daily. The frequency of paresthesias ishappening less than once a week, is much improved. She presents today for evaluation unaccompanied.She has not actually started nortriptyline, has not needed up to this point.  Update May 13, 2020 SS: Headaches remain overall well controlled, at worse 1-2 a month, recently last 2 weeks, has had 3 headaches, brought on by weather change.  Headaches start under the left eye, as pressure, will gradually spread up to the top of head (once at this point is worst).  Will take Imitrex with good benefit.  Occasionally, will get left sided facial paresthesia, numbness-not nearly as significant or frequent, previously thought to be related to higher dose metoprolol, now taking 12.5 mg daily.  Never started nortriptyline. Headaches triggered by smells. Has 74 year old son.   Update November 12, 2020 SS: Headaches remain  well  controlled, could go a month with no headache, then have a few days of cluster headache. Had headache last weekend 3 days, taking 2 doses of Imitrex did not relieve.  It does make her nauseated.  Known triggers are smells, but is not the case when wakes up with headache. 1 spell of A. fib since last seen.  Does not feel headaches are frequent enough to be on preventive medication.  REVIEW OF SYSTEMS: Out of a complete 14 system review of symptoms, the patient complains only of the following symptoms, and all other reviewed systems are negative.  Headache  ALLERGIES: Allergies  Allergen Reactions  . Other Swelling    cherries    HOME MEDICATIONS: Outpatient Medications Prior to Visit  Medication Sig Dispense Refill  . Ascorbic Acid (VITAMIN C) 1000 MG tablet Take 2,000 mg by mouth daily.    . cetirizine (ZYRTEC) 10 MG chewable tablet Chew 10 mg by mouth as needed.     . DUPIXENT 300 MG/2ML SOPN Inject into the skin.    . flecainide (TAMBOCOR) 50 MG tablet Take 1 tablet (50 mg total) by mouth 2 (two) times daily. 60 tablet 6  . levothyroxine (SYNTHROID, LEVOTHROID) 75 MCG tablet Take 75 mcg by mouth daily before breakfast.    . metoprolol succinate (TOPROL-XL) 25 MG 24 hr tablet Take 1 tablet (25 mg total) by mouth daily. 90 tablet 3  . Misc Natural Products (TUMERSAID PO) Take by mouth. Taking one gummy daily- not sure the dosage    . montelukast (SINGULAIR) 10 MG tablet Take 10 mg by mouth at bedtime.    . Multiple Vitamins-Minerals (MULTIVITAMIN ADULT PO) Take 1 tablet by mouth daily.     . SUMAtriptan (IMITREX) 50 MG tablet Take 1 tablet (50 mg total) by mouth every 2 (two) hours as needed for migraine. May repeat in 2 hours if headache persists or recurs. 12 tablet 11  . Vitamin D, Ergocalciferol, (DRISDOL) 1.25 MG (50000 UNIT) CAPS capsule Take 50,000 Units by mouth once a week.     No facility-administered medications prior to visit.    PAST MEDICAL HISTORY: Past Medical History:   Diagnosis Date  . Anemia   . Asthma   . Atrial fibrillation with rapid ventricular response ([place]) 01/12/2019  . Bradycardia 08/13/2016  . Headache   . HSV infection   . Hypothyroidism   . Iron deficiency   . Migraine   . Tachycardia    intermittent tachycardia  . Tingling   . Vaginal Pap smear, abnormal    colposcopy   . Vitamin D deficiency     PAST SURGICAL HISTORY: Past Surgical History:  Procedure Laterality Date  . CESAREAN SECTION N/A 04/21/2015   Procedure: CESAREAN SECTION;  Surgeon: Everett Graff, MD;  Location: [facility];  Service: Obstetrics;  Laterality: N/A;  . removal of polyp from uterus      FAMILY HISTORY: Family History  Problem Relation Age of Onset  . Hypertension Father   . Hypercholesterolemia Mother   . Sarcoidosis Mother   . Asthma Brother   . Sickle cell trait Brother   . Breast cancer Neg Hx     SOCIAL HISTORY: Social History   Socioeconomic History  . Marital status: Single    Spouse name: Not on file  . Number of children: 1  . Years of education: BS  . Highest education level: Not on file  Occupational History  . Occupation: Garment/textile technologist  Tobacco Use  . Smoking status:  Never Smoker  . Smokeless tobacco: Never Used  Vaping Use  . Vaping Use: Never used  Substance and Sexual Activity  . Alcohol use: Yes    Comment: occasional  . Drug use: No  . Sexual activity: Yes  Other Topics Concern  . Not on file  Social History Narrative   Lives at home with her mother and son.   Right-handed.   Occasional soda.   Social Determinants of Health   Financial Resource Strain:   . Difficulty of Paying Living Expenses: Not on file  Food Insecurity:   . Worried About Charity fundraiser in the Last Year: Not on file  . Ran Out of Food in the Last Year: Not on file  Transportation Needs:   . Lack of Transportation (Medical): Not on file  . Lack of Transportation (Non-Medical): Not on file  Physical Activity:   . Days of Exercise  per Week: Not on file  . Minutes of Exercise per Session: Not on file  Stress:   . Feeling of Stress : Not on file  Social Connections:   . Frequency of Communication with Friends and Family: Not on file  . Frequency of Social Gatherings with Friends and Family: Not on file  . Attends Religious Services: Not on file  . Active Member of Clubs or Organizations: Not on file  . Attends Archivist Meetings: Not on file  . Marital Status: Not on file  Intimate Partner Violence:   . Fear of Current or Ex-Partner: Not on file  . Emotionally Abused: Not on file  . Physically Abused: Not on file  . Sexually Abused: Not on file   PHYSICAL EXAM  Vitals:   11/12/20 1050  BP: (!) 106/58  Pulse: (!) 59  Weight: 186 lb (84.4 kg)  Height: 5\' 7"  (1.702 m)   Body mass index is 29.13 kg/m.  Generalized: Well developed, in no acute distress   Neurological examination  Mentation: Alert oriented to time, place, history taking. Follows all commands speech and language fluent Cranial nerve II-XII: Pupils were equal round reactive to light. Extraocular movements were full, visual field were full on confrontational test. Facial sensation and strength were normal. Head turning and shoulder shrug  were normal and symmetric. Motor: The motor testing reveals 5 over 5 strength of all 4 extremities. Good symmetric motor tone is noted throughout.  Sensory: Sensory testing is intact to soft touch on all 4 extremities. No evidence of extinction is noted.  Coordination: Cerebellar testing reveals good finger-nose-finger and heel-to-shin bilaterally.  Gait and station: Gait is normal.  Reflexes: Deep tendon reflexes are symmetric and normal bilaterally.   DIAGNOSTIC DATA (LABS, IMAGING, TESTING) - I reviewed patient records, labs, notes, testing and imaging myself where available.  Lab Results  Component Value Date   WBC 4.2 04/03/2019   HGB 12.9 04/03/2019   HCT 40.3 04/03/2019   MCV 92.9  04/03/2019   PLT 370 04/03/2019      Component Value Date/Time   NA 136 04/03/2019 1851   NA 137 04/05/2017 1034   K 4.2 04/03/2019 1851   CL 104 04/03/2019 1851   CO2 24 04/03/2019 1851   GLUCOSE 87 04/03/2019 1851   BUN 15 04/03/2019 1851   BUN 9 04/05/2017 1034   CREATININE 0.82 04/03/2019 1851   CALCIUM 9.1 04/03/2019 1851   PROT 8.1 01/11/2019 1127   PROT 7.6 04/05/2017 1034   ALBUMIN 4.3 01/11/2019 1127   ALBUMIN 4.6 04/05/2017 1034  AST 13 (L) 01/11/2019 1127   ALT 11 01/11/2019 1127   ALKPHOS 34 (L) 01/11/2019 1127   BILITOT 1.0 01/11/2019 1127   BILITOT 1.3 (H) 04/05/2017 1034   GFRNONAA >60 04/03/2019 1851   GFRAA >60 04/03/2019 1851   No results found for: CHOL, HDL, LDLCALC, LDLDIRECT, TRIG, CHOLHDL No results found for: HGBA1C No results found for: VITAMINB12 Lab Results  Component Value Date   TSH 3.170 01/12/2019    ASSESSMENT AND PLAN 44 y.o. year old female  has a past medical history of Anemia, Asthma, Atrial fibrillation with rapid ventricular response ([place]) (01/12/2019), Bradycardia (08/13/2016), Headache, HSV infection, Hypothyroidism, Iron deficiency, Migraine, Tachycardia, Tingling, Vaginal Pap smear, abnormal, and Vitamin D deficiency. here with:  1.  Worsening headaches 2.  Worsening left-sided paresthesia -Headaches overall well controlled, sometimes Imitrex does not completely relieve the headache -MRI of the brain showed no significant abnormality -Try Nurtec 75 mg for acute headache, as alternative to Imitrex to see if better benefit (up to date showed no potential interaction with flecainide) -Will refill Imitrex 50 mg as needed for acute headache if Nurtec is not helpful -Headaches not frequent enough for daily preventative medications -Follow-up in 6 months or sooner if needed  I spent 20 minutes of face-to-face and non-face-to-face time with patient.  This included previsit chart review, lab review, study review, order entry, electronic  health record documentation, patient education.  Butler Denmark, AGNP-C, DNP 11/12/2020, 10:53 AM [facility] [address] [place], [place] [address] [phone]

## 2020-11-12 NOTE — Patient Instructions (Signed)
Let's try Nurtec for acute headache, take 1 at onset of headache, max is 1 tablet in 24 hours See you back in 6 months

## 2020-12-04 ENCOUNTER — Other Ambulatory Visit: Payer: Self-pay | Admitting: Family Medicine

## 2020-12-04 DIAGNOSIS — Z1231 Encounter for screening mammogram for malignant neoplasm of breast: Secondary | ICD-10-CM

## 2021-01-09 NOTE — Progress Notes (Signed)
I have reviewed and agreed above plan. 

## 2021-01-10 ENCOUNTER — Ambulatory Visit
Admission: RE | Admit: 2021-01-10 | Discharge: 2021-01-10 | Disposition: A | Discharge status: Home / Self Care | Payer: Managed Care, Other (non HMO) | Source: Ambulatory Visit | Attending: Family Medicine | Admitting: Family Medicine

## 2021-01-10 ENCOUNTER — Other Ambulatory Visit: Payer: Self-pay

## 2021-01-10 DIAGNOSIS — Z1231 Encounter for screening mammogram for malignant neoplasm of breast: Secondary | ICD-10-CM

## 2021-01-16 ENCOUNTER — Other Ambulatory Visit: Payer: Managed Care, Other (non HMO)

## 2021-01-16 DIAGNOSIS — Z20822 Contact with and (suspected) exposure to covid-19: Secondary | ICD-10-CM

## 2021-01-17 LAB — SARS-COV-2, NAA 2 DAY TAT

## 2021-01-17 LAB — NOVEL CORONAVIRUS, NAA: SARS-CoV-2, NAA: NOT DETECTED

## 2021-01-22 ENCOUNTER — Other Ambulatory Visit: Payer: Managed Care, Other (non HMO)

## 2021-01-22 DIAGNOSIS — Z20822 Contact with and (suspected) exposure to covid-19: Secondary | ICD-10-CM

## 2021-01-23 LAB — SARS-COV-2, NAA 2 DAY TAT

## 2021-01-23 LAB — NOVEL CORONAVIRUS, NAA: SARS-CoV-2, NAA: NOT DETECTED

## 2021-03-11 NOTE — Progress Notes (Signed)
Primary Care Physician: Donald Prose, MD Primary Cardiologist: Dr Radford Pax Primary Electrophysiologist: Dr Rayann Heman Referring Physician: Dr Maren Beach Janice Flores is a 45 y.o. female with a history of asthma, hypothyroidism, and paroxysmal atrial fibrillation who presents for follow up in the [facility]. Patient reports that in February she woke in the middle of the night with palpations and SOB. She presented to the ER and was found to be in afib with RVR. She was given metoprolol on discharge and patient reports she spontaneously converted to SR later that day. She had another episode of heart racing on 04/03/19 and again went to the ER and was found to be in atrial flutter with variable conduction. Cardioversion was not pursued at that time. Patient feels that she has been persistently out of rhythm since her ER visit with nearly constant symptoms of palpitations, periodic lightheadedness, and SOB on exertion. Her Apple Watch has shown labile HR up to 140s. She denies snoring or significant alcohol use. There were no triggers during each of these episodes that the patient could identify. She was started on flecainide on 05/22/19.  On follow up today, patient reports that she has done well since her last visit. She had two brief episodes of palpitations which resolved in a few minutes.   Today, she denies symptoms of chest pain, shortness of breath, orthopnea, PND, lower extremity edema, dizziness, presyncope, syncope, snoring, daytime somnolence, bleeding, or neurologic sequela. The patient is tolerating medications without difficulties and is otherwise without complaint today.    Atrial Fibrillation Risk Factors:  she does not have symptoms or diagnosis of sleep apnea. Negative sleep study. she does not have a history of rheumatic fever. she does not have a history of alcohol use. The patient does have a history of early familial atrial fibrillation or other  arrhythmias. Mother has afib.  she has a BMI of Body mass index is 30.45 kg/m.Marland Kitchen Filed Weights   03/12/21 0947  Weight: 88.2 kg    Family History  Problem Relation Age of Onset  . Hypertension Father   . Hypercholesterolemia Mother   . Sarcoidosis Mother   . Asthma Brother   . Sickle cell trait Brother   . Breast cancer Neg Hx      Atrial Fibrillation Management history:  Previous antiarrhythmic drugs: flecainide Previous cardioversions: none Previous ablations: none CHADS2VASC score: 1 Anticoagulation history: Eliquis   Past Medical History:  Diagnosis Date  . Anemia   . Asthma   . Atrial fibrillation with rapid ventricular response ([place]) 01/12/2019  . Bradycardia 08/13/2016  . Headache   . HSV infection   . Hypothyroidism   . Iron deficiency   . Migraine   . Tachycardia    intermittent tachycardia  . Tingling   . Vaginal Pap smear, abnormal    colposcopy   . Vitamin D deficiency    Past Surgical History:  Procedure Laterality Date  . CESAREAN SECTION N/A 04/21/2015   Procedure: CESAREAN SECTION;  Surgeon: Everett Graff, MD;  Location: [facility];  Service: Obstetrics;  Laterality: N/A;  . removal of polyp from uterus      Current Outpatient Medications  Medication Sig Dispense Refill  . albuterol (VENTOLIN HFA) 108 (90 Base) MCG/ACT inhaler     . Ascorbic Acid (VITAMIN C) 1000 MG tablet Take 2,000 mg by mouth daily.    . cetirizine (ZYRTEC) 10 MG chewable tablet Chew 10 mg by mouth as needed.     Marland Kitchen  DUPIXENT 300 MG/2ML SOPN Inject into the skin.    . flecainide (TAMBOCOR) 50 MG tablet Take 1 tablet (50 mg total) by mouth 2 (two) times daily. 60 tablet 6  . levothyroxine (SYNTHROID, LEVOTHROID) 75 MCG tablet Take 75 mcg by mouth daily before breakfast.    . meloxicam (MOBIC) 15 MG tablet Take 1 tablet by mouth daily.    . metoprolol succinate (TOPROL-XL) 25 MG 24 hr tablet Take 1 tablet (25 mg total) by mouth daily. (Patient taking differently: Take 12.5 mg by  mouth daily.) 90 tablet 3  . montelukast (SINGULAIR) 10 MG tablet Take 10 mg by mouth at bedtime.    . Multiple Vitamins-Minerals (MULTIVITAMIN ADULT PO) Take 1 tablet by mouth daily.     . Rimegepant Sulfate (NURTEC) 75 MG TBDP Take 75 mg by mouth as needed (Take 1 at onset of headache, max is 1 tablet in 24 hours). 12 tablet 11  . tacrolimus (PROTOPIC) 0.1 % ointment SMARTSIG:Topical 1 to 2 Times Daily PRN     No current facility-administered medications for this encounter.    Allergies  Allergen Reactions  . Other Swelling    cherries    Social History   Socioeconomic History  . Marital status: Single    Spouse name: Not on file  . Number of children: 1  . Years of education: BS  . Highest education level: Not on file  Occupational History  . Occupation: Garment/textile technologist  Tobacco Use  . Smoking status: Never Smoker  . Smokeless tobacco: Never Used  Vaping Use  . Vaping Use: Never used  Substance and Sexual Activity  . Alcohol use: Yes    Alcohol/week: 1.0 - 2.0 standard drink    Types: 1 - 2 Standard drinks or equivalent per week    Comment: occasional  . Drug use: No  . Sexual activity: Yes  Other Topics Concern  . Not on file  Social History Narrative   Lives at home with her mother and son.   Right-handed.   Occasional soda.   Social Determinants of Health   Financial Resource Strain: Not on file  Food Insecurity: Not on file  Transportation Needs: Not on file  Physical Activity: Not on file  Stress: Not on file  Social Connections: Not on file  Intimate Partner Violence: Not on file     ROS- All systems are reviewed and negative except as per the HPI above.  Physical Exam: Vitals:   03/12/21 0947  BP: 114/70  Pulse: 60  Weight: 88.2 kg  Height: 5\' 7"  (1.702 m)    GEN- The patient is a well appearing obese female, alert and oriented x 3 today.   HEENT-head normocephalic, atraumatic, sclera clear, conjunctiva pink, hearing intact, trachea  midline. Lungs- Clear to ausculation bilaterally, normal work of breathing Heart- Regular rate and rhythm, no murmurs, rubs or gallops  GI- soft, NT, ND, + BS Extremities- no clubbing, cyanosis, or edema MS- no significant deformity or atrophy Skin- no rash or lesion Psych- euthymic mood, full affect Neuro- strength and sensation are intact   Wt Readings from Last 3 Encounters:  03/12/21 88.2 kg  11/12/20 84.4 kg  09/11/20 86.2 kg    EKG today demonstrates  SR Vent. rate 60 BPM PR interval 158 ms QRS duration 106 ms QT/QTcB 420/420 ms  Echo 01/18/19 demonstrated  1. The left ventricle has hyperdynamic systolic function, with an ejection fraction of >65%. The cavity size was normal. Left ventricular diastolic parameters were  normal.  2. The right ventricle has normal systolic function. The cavity was normal. There is no increase in right ventricular wall thickness.  3. The mitral valve is normal in structure.  4. The tricuspid valve is normal in structure.  5. The aortic valve is normal in structure. no stenosis of the aortic valve.  6. The aortic root and ascending aorta are normal in size and structure.  7. The interatrial septum was not assessed.  Epic records are reviewed at length today  Assessment and Plan:  1. Paroxysmal atrial fibrillation/atrial flutter Patient appears to be maintaining SR. Continue flecainide 50 mg BID Continue Toprol 12.5 mg daily No anticoagulation indicated at this time.  This patients CHA2DS2-VASc Score and unadjusted Ischemic Stroke Rate (% per year) is equal to 0.6 % stroke rate/year from a score of 1  Above score calculated as 1 point each if present [CHF, HTN, DM, Vascular=MI/PAD/Aortic Plaque, Age if 65-74, or Female] Above score calculated as 2 points each if present [Age > 75, or Stroke/TIA/TE]   Follow up with Dr Radford Pax in 6 months per recall. AF clinic in one year.    [facility] [address] [place], [place] [address] [phone] 03/12/2021 9:56 AM

## 2021-03-12 ENCOUNTER — Ambulatory Visit (HOSPITAL_COMMUNITY)
Admission: RE | Admit: 2021-03-12 | Discharge: 2021-03-12 | Disposition: A | Discharge status: Home / Self Care | Payer: Managed Care, Other (non HMO) | Source: Ambulatory Visit | Attending: Physician Assistant | Admitting: Physician Assistant

## 2021-03-12 ENCOUNTER — Encounter (HOSPITAL_COMMUNITY): Payer: Self-pay | Admitting: Physician Assistant

## 2021-03-12 ENCOUNTER — Other Ambulatory Visit: Payer: Self-pay

## 2021-03-12 VITALS — BP 114/70 | HR 60 | Ht 67.0 in | Wt 194.4 lb

## 2021-03-12 DIAGNOSIS — I480 Paroxysmal atrial fibrillation: Secondary | ICD-10-CM | POA: Diagnosis present

## 2021-03-12 DIAGNOSIS — I4892 Unspecified atrial flutter: Secondary | ICD-10-CM | POA: Diagnosis not present

## 2021-04-01 ENCOUNTER — Other Ambulatory Visit (HOSPITAL_COMMUNITY): Payer: Self-pay | Admitting: *Deleted

## 2021-04-01 MED ORDER — FLECAINIDE ACETATE 50 MG PO TABS: 50.0000 mg | ORAL_TABLET | Freq: Two times a day (BID) | ORAL | 6 refills | Status: DC

## 2021-04-11 ENCOUNTER — Other Ambulatory Visit: Payer: Self-pay

## 2021-04-11 ENCOUNTER — Ambulatory Visit (HOSPITAL_COMMUNITY)
Admission: EM | Admit: 2021-04-11 | Discharge: 2021-04-11 | Disposition: A | Discharge status: Home / Self Care | Payer: Managed Care, Other (non HMO) | Attending: Physician Assistant | Admitting: Physician Assistant

## 2021-04-11 ENCOUNTER — Encounter (HOSPITAL_COMMUNITY): Payer: Self-pay

## 2021-04-11 DIAGNOSIS — J0100 Acute maxillary sinusitis, unspecified: Secondary | ICD-10-CM | POA: Diagnosis not present

## 2021-04-11 MED ORDER — AMOXICILLIN-POT CLAVULANATE 875-125 MG PO TABS: 1.0000 | ORAL_TABLET | Freq: Two times a day (BID) | ORAL | 0 refills | Status: DC

## 2021-04-11 NOTE — ED Triage Notes (Signed)
Pt in with c/o headaches, st, right ear pain and congestion x 4 days  Pt has not had medication for sx

## 2021-04-11 NOTE — ED Provider Notes (Signed)
[facility]    CSN: [id number] Arrival date & time: 04/11/21  1158      History   Chief Complaint Chief Complaint  Patient presents with  . Migraine  . Otalgia  . Sore Throat  . Nasal Congestion    HPI Janice Flores is a 45 y.o. female.   Patient presents today with a weeklong history of sinus pressure.  Reports associated nasal congestion, productive cough with thick sputum, sore throat, left otalgia.  Reports she developed a severe headache yesterday consistent with a migraine this is improved today.  Denies worst headache of her life.  Denies fever, body aches, chest pain, shortness of breath.  She has tried prescribed montelukast without improvement of symptoms.  She is not taking any decongestants as she has an history of atrial fibrillation.  She denies any known sick contacts.  She is up-to-date on COVID-19 vaccinations including booster and flu shot.  She does have a history of allergies and states symptoms are more severe than typical episodes of this condition.  She does have a history of asthma but has not required rescue inhaler.  Denies COPD or smoking.  Denies any recent antibiotic use.  She is able to perform daily activities despite symptoms.     Past Medical History:  Diagnosis Date  . Anemia   . Asthma   . Atrial fibrillation with rapid ventricular response ([place]) 01/12/2019  . Bradycardia 08/13/2016  . Headache   . HSV infection   . Hypothyroidism   . Iron deficiency   . Migraine   . Tachycardia    intermittent tachycardia  . Tingling   . Vaginal Pap smear, abnormal    colposcopy   . Vitamin D deficiency     Patient Active Problem List   Diagnosis Date Noted  . Chronic migraine w/o aura w/o status migrainosus, not intractable 08/10/2019  . Paresthesia 08/10/2019  . Paroxysmal atrial fibrillation ([place]) 04/11/2019  . Typical atrial flutter ([place]) 04/05/2019  . Atrial fibrillation with rapid ventricular response ([place]) 01/12/2019  .  Hypertension 04/05/2017  . Bradycardia 08/13/2016  . Migraine 11/06/2015  . Episodic headache 11/06/2015  . 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 Advanced maternal age, 1st pregnancy 04/21/2015  . Positive GBS test 04/21/2015  . Normal labor 04/21/2015  . SOB (shortness of breath) 02/22/2015  . Palpitations 02/22/2015  . PVC's (premature ventricular contractions) 09/28/2013  . Hypothyroidism   . Iron deficiency   . Anemia     Past Surgical History:  Procedure Laterality Date  . CESAREAN SECTION N/A 04/21/2015   Procedure: CESAREAN SECTION;  Surgeon: Everett Graff, MD;  Location: [facility];  Service: Obstetrics;  Laterality: N/A;  . removal of polyp from uterus      OB History    Gravida  1   Para  1   Term  1   Preterm      AB      Living  0     SAB      IAB      Ectopic      Multiple  0   Live Births               Home Medications    Prior to Admission medications   Medication Sig Start Date End Date Taking? Authorizing Provider  amoxicillin-clavulanate (AUGMENTIN) 875-125 MG tablet Take 1 tablet by mouth every 12 (twelve) hours. 04/11/21  Yes Reyli Schroth K, PA-C  albuterol (VENTOLIN HFA) 108 (90 Base) MCG/ACT inhaler  [provider]  Ascorbic Acid (VITAMIN C) 1000 MG tablet Take 2,000 mg by mouth daily.    [provider]  cetirizine (ZYRTEC) 10 MG chewable tablet Chew 10 mg by mouth as needed.     [provider]  DUPIXENT 300 MG/2ML SOPN Inject into the skin. 10/08/20   [provider]  flecainide (TAMBOCOR) 50 MG tablet Take 1 tablet (50 mg total) by mouth 2 (two) times daily. 04/01/21   Fenton, Clint R, PA  levothyroxine (SYNTHROID, LEVOTHROID) 75 MCG tablet Take 75 mcg by mouth daily before breakfast.    [provider]  meloxicam (MOBIC) 15 MG tablet Take 1 tablet by mouth daily. 11/11/20   [provider]  metoprolol succinate (TOPROL-XL) 25 MG 24 hr tablet Take 1 tablet (25 mg total) by mouth  daily. Patient taking differently: Take 12.5 mg by mouth daily. 09/11/20   Fenton, Clint R, PA  montelukast (SINGULAIR) 10 MG tablet Take 10 mg by mouth at bedtime. 02/14/20   [provider]  Multiple Vitamins-Minerals (MULTIVITAMIN ADULT PO) Take 1 tablet by mouth daily.     [provider]  Rimegepant Sulfate (NURTEC) 75 MG TBDP Take 75 mg by mouth as needed (Take 1 at onset of headache, max is 1 tablet in 24 hours). 11/12/20   Suzzanne Cloud, NP  tacrolimus (PROTOPIC) 0.1 % ointment SMARTSIG:Topical 1 to 2 Times Daily PRN 12/18/20   [provider]    Family History Family History  Problem Relation Age of Onset  . Hypertension Father   . Hypercholesterolemia Mother   . Sarcoidosis Mother   . Asthma Brother   . Sickle cell trait Brother   . Breast cancer Neg Hx     Social History Social History   Tobacco Use  . Smoking status: Never Smoker  . Smokeless tobacco: Never Used  Vaping Use  . Vaping Use: Never used  Substance Use Topics  . Alcohol use: Yes    Alcohol/week: 1.0 - 2.0 standard drink    Types: 1 - 2 Standard drinks or equivalent per week    Comment: occasional  . Drug use: No     Allergies   Other   Review of Systems Review of Systems  Constitutional: Positive for activity change. Negative for appetite change, fatigue and fever.  HENT: Positive for congestion, ear pain, sinus pressure and sore throat. Negative for rhinorrhea and sneezing.   Respiratory: Positive for cough. Negative for shortness of breath.   Cardiovascular: Negative for chest pain and palpitations.  Gastrointestinal: Negative for abdominal pain, diarrhea, nausea and vomiting.  Musculoskeletal: Negative for arthralgias and myalgias.  Neurological: Positive for headaches. Negative for dizziness and light-headedness.     Physical Exam Triage Vital Signs ED Triage Vitals  Enc Vitals Group     BP 04/11/21 1303 113/65     Pulse Rate 04/11/21 1303 (!) 58     Resp  04/11/21 1303 19     Temp 04/11/21 1303 98.1 F (36.7 C)     Temp src --      SpO2 04/11/21 1303 100 %     Weight --      Height --      Head Circumference --      Peak Flow --      Pain Score 04/11/21 1301 2     Pain Loc --      Pain Edu? --      Excl. in GC? --    No  data found.  Updated Vital Signs BP 113/65   Pulse (!) 58   Temp 98.1 F (36.7 C)   Resp 19   LMP 03/24/2021 (Exact Date)   SpO2 100%   Visual Acuity Right Eye Distance:   Left Eye Distance:   Bilateral Distance:    Right Eye Near:   Left Eye Near:    Bilateral Near:     Physical Exam Vitals reviewed.  Constitutional:      General: She is awake. She is not in acute distress.    Appearance: Normal appearance. She is not ill-appearing.     Comments: Very pleasant female appears stated age in no acute distress  HENT:     Head: Normocephalic and atraumatic.     Right Ear: Ear canal and external ear normal. A middle ear effusion is present. Tympanic membrane is not erythematous or bulging.     Left Ear: Ear canal and external ear normal. A middle ear effusion is present. Tympanic membrane is not erythematous or bulging.     Nose:     Right Sinus: Maxillary sinus tenderness present. No frontal sinus tenderness.     Left Sinus: Maxillary sinus tenderness present. No frontal sinus tenderness.     Mouth/Throat:     Pharynx: Uvula midline. No oropharyngeal exudate or posterior oropharyngeal erythema.     Comments: Drainage posterior oropharynx. Cardiovascular:     Rate and Rhythm: Normal rate and regular rhythm.     Heart sounds: No murmur heard.   Pulmonary:     Effort: Pulmonary effort is normal.     Breath sounds: Normal breath sounds. No wheezing, rhonchi or rales.     Comments: Clear to auscultation bilaterally Lymphadenopathy:     Head:     Right side of head: No submental, submandibular or tonsillar adenopathy.     Left side of head: No submental, submandibular or tonsillar adenopathy.      Cervical: No cervical adenopathy.  Psychiatric:        Behavior: Behavior is cooperative.      UC Treatments / Results  Labs (all labs ordered are listed, but only abnormal results are displayed) Labs Reviewed - No data to display  EKG   Radiology No results found.  Procedures Procedures (including critical care time)  Medications Ordered in UC Medications - No data to display  Initial Impression / Assessment and Plan / UC Course  I have reviewed the triage vital signs and the nursing notes.  Pertinent labs & imaging results that were available during my care of the patient were reviewed by me and considered in my medical decision making (see chart for details).     No indication for COVID-19 or flu testing given patient has been symptomatic for over a week at this would not change management.  She was started on Augmentin given prolonged and worsening symptoms.  Recommended she use Mucinex and prescribed allergy medication for symptom relief.  Discussed that she should not take anything with a decongestant given cardiovascular history to which she expressed understanding.  Strict return precautions given to which patient expressed understanding.  Final Clinical Impressions(s) / UC Diagnoses   Final diagnoses:  Acute non-recurrent maxillary sinusitis     Discharge Instructions     Take Augmentin twice a day for a week.  If anything worsens please return for reevaluation.  You can take plain Mucinex to help with symptoms.  Make sure you are resting and drinking plenty of fluid.  ED Prescriptions    Medication Sig Dispense Auth. Provider   amoxicillin-clavulanate (AUGMENTIN) 875-125 MG tablet Take 1 tablet by mouth every 12 (twelve) hours. 14 tablet Vonda Harth, Derry Skill, PA-C     PDMP not reviewed this encounter.   Terrilee Croak, PA-C 04/11/21 1323

## 2021-04-11 NOTE — Discharge Instructions (Addendum)
Take Augmentin twice a day for a week.  If anything worsens please return for reevaluation.  You can take plain Mucinex to help with symptoms.  Make sure you are resting and drinking plenty of fluid.

## 2021-05-14 ENCOUNTER — Encounter: Payer: Self-pay | Admitting: Neurology

## 2021-05-14 ENCOUNTER — Ambulatory Visit: Payer: Managed Care, Other (non HMO) | Admitting: Neurology

## 2021-05-14 VITALS — BP 103/63 | HR 50 | Ht 67.0 in | Wt 192.0 lb

## 2021-05-14 DIAGNOSIS — G43709 Chronic migraine without aura, not intractable, without status migrainosus: Secondary | ICD-10-CM

## 2021-05-14 MED ORDER — NURTEC 75 MG PO TBDP: 75.0000 mg | ORAL_TABLET | ORAL | 11 refills | Status: DC | PRN

## 2021-05-14 NOTE — Patient Instructions (Signed)
Continue current medication Take Nurtec at onset of headache  See you back in 1 year

## 2021-05-14 NOTE — Progress Notes (Signed)
PATIENT: Janice Flores DOB: [birth year]  REASON FOR VISIT: follow up HISTORY FROM: patient  HISTORY OF PRESENT ILLNESS: Today 05/14/21  HISTORY Janice Biggers Crumdyis a 45 year old right-handed female alone at today's clinical visit, seen in refer by her primary care physician Dr.Vyvyan Sun for evaluation of headacheson August 10, 2019, I saw her previously on November 06 2015 for migraine  I have reviewed and summarize her most recent office note, she had a history of hypothyroidism, intermittent asthma, iron deficiency anemia, vitamin D deficiency, idiopathic scoliosis of lumbar region, history of herpes simplex type II, PVCs  She started to have headaches since 2013, she reported sudden onset transient sharp headache lasting for few seconds with sudden positional change, for a while, it has improved, but since October 2016, she began to have frequent occurrence, multiple episodes in a day, usually happened with sudden positional change, turning her head, bending over, getting up from bed  In addition she also had a history of migraine headaches, above-mentioned headache is different from her typical migraine, her typical migraine left frontal behind eye severe pounding headache with associated light noise sensitivity, nauseous, lasting for 1 day,   She is currently still nursing her 7-month-old son, tried Tylenol for migraine with limited help, she was started on preventive medication magnesium oxide, riboflavin, which has been helpful, 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, no evidence of compression, headache was much improved, has lost follow-up,  She returns today for worsening headache since March 2020  Her headache is usually triggered by strong smells, such as perfume, cleaning agent, exertion, sudden head movement, her typical headache left parietal retro-orbital region pounding headache with associated light, noise sensitivity, nauseous, ringing in  ears, lasting for few hours, she also complains of intermittent left face, left finger paresthesia  She is having migraine headaches 3-4 times each week, tried Imitrex without significant improvement,  Update October 26, 2019, She is doing well, still has intermittent left lower face numbness, sometimes upper extremity paresthesia, only lasting for few minutes, no weakness,  She has migraine headaches every couple months, responding well to Imitrex  Personally reviewed MRI of the brain in September 2020, partially empty sella, no intracranial abnormality  Update December 7, 2020SS:She was recently seen by Dr. Krista Blue 3 weeks ago.She reports she has continued to do well, has had less frequent paresthesia and headaches. Has hadonly had 2 migraines in September, reports excellent benefit with Imitrex. She is working with cardiology to decrease her dose of metoprolol. She thinks the decreaseddose has resulted in less episodes of intermittent left lower face numbness, paresthesia to left upper extremity. She is now taking metoprolol 12.5 mg daily. The frequency of paresthesias ishappening less than once a week, is much improved. She presents today for evaluation unaccompanied.She has not actually started nortriptyline, has not needed up to this point.  Update May 13, 2020 SS: Headaches remain overall well controlled, at worse 1-2 a month, recently last 2 weeks, has had 3 headaches, brought on by weather change.  Headaches start under the left eye, as pressure, will gradually spread up to the top of head (once at this point is worst).  Will take Imitrex with good benefit.  Occasionally, will get left sided facial paresthesia, numbness-not nearly as significant or frequent, previously thought to be related to higher dose metoprolol, now taking 12.5 mg daily.  Never started nortriptyline. Headaches triggered by smells. Has 14 year old son.   Update November 12, 2020 SS: Headaches remain  well  controlled, could go a month with no headache, then have a few days of cluster headache. Had headache last weekend 3 days, taking 2 doses of Imitrex did not relieve.  It does make her nauseated.  Known triggers are smells, but is not the case when wakes up with headache. 1 spell of A. fib since last seen.  Does not feel headaches are frequent enough to be on preventive medication.  Update May 14, 2021  SS: Headaches remain well controlled, overcast weather days, certain smells are triggers. Frequency depends on triggers. Taking Nurtec for acute headache works really well/fast, hasn't taken Imitrex at all. Nurtec filled in December # 12 tablets, only filled twice since. Migraines start left temple area, localize to left vertex when really bad. Headaches have calmed down a lot more. Is more aware of her triggers. No AFIB episodes recently.   REVIEW OF SYSTEMS: Out of a complete 14 system review of symptoms, the patient complains only of the following symptoms, and all other reviewed systems are negative.  Headache  ALLERGIES: Allergies  Allergen Reactions  . Other Swelling    cherries    HOME MEDICATIONS: Outpatient Medications Prior to Visit  Medication Sig Dispense Refill  . albuterol (VENTOLIN HFA) 108 (90 Base) MCG/ACT inhaler     . Ascorbic Acid (VITAMIN C) 1000 MG tablet Take 2,000 mg by mouth daily.    . cetirizine (ZYRTEC) 10 MG chewable tablet Chew 10 mg by mouth as needed.     . DUPIXENT 300 MG/2ML SOPN Inject into the skin.    . flecainide (TAMBOCOR) 50 MG tablet Take 1 tablet (50 mg total) by mouth 2 (two) times daily. 60 tablet 6  . levothyroxine (SYNTHROID, LEVOTHROID) 75 MCG tablet Take 75 mcg by mouth daily before breakfast.    . metoprolol succinate (TOPROL-XL) 25 MG 24 hr tablet Take 1 tablet (25 mg total) by mouth daily. (Patient taking differently: Take 12.5 mg by mouth daily.) 90 tablet 3  . montelukast (SINGULAIR) 10 MG tablet Take 10 mg by mouth at bedtime.    .  Multiple Vitamins-Minerals (MULTIVITAMIN ADULT PO) Take 1 tablet by mouth daily.     . tacrolimus (PROTOPIC) 0.1 % ointment SMARTSIG:Topical 1 to 2 Times Daily PRN    . Rimegepant Sulfate (NURTEC) 75 MG TBDP Take 75 mg by mouth as needed (Take 1 at onset of headache, max is 1 tablet in 24 hours). 12 tablet 11  . amoxicillin-clavulanate (AUGMENTIN) 875-125 MG tablet Take 1 tablet by mouth every 12 (twelve) hours. 14 tablet 0  . meloxicam (MOBIC) 15 MG tablet Take 1 tablet by mouth daily.     No facility-administered medications prior to visit.    PAST MEDICAL HISTORY: Past Medical History:  Diagnosis Date  . Anemia   . Asthma   . Atrial fibrillation with rapid ventricular response ([place]) 01/12/2019  . Bradycardia 08/13/2016  . Headache   . HSV infection   . Hypothyroidism   . Iron deficiency   . Migraine   . Tachycardia    intermittent tachycardia  . Tingling   . Vaginal Pap smear, abnormal    colposcopy   . Vitamin D deficiency     PAST SURGICAL HISTORY: Past Surgical History:  Procedure Laterality Date  . CESAREAN SECTION N/A 04/21/2015   Procedure: CESAREAN SECTION;  Surgeon: Everett Graff, MD;  Location: [facility];  Service: Obstetrics;  Laterality: N/A;  . removal of polyp from uterus      FAMILY HISTORY:  Family History  Problem Relation Age of Onset  . Hypertension Father   . Hypercholesterolemia Mother   . Sarcoidosis Mother   . Asthma Brother   . Sickle cell trait Brother   . Breast cancer Neg Hx     SOCIAL HISTORY: Social History   Socioeconomic History  . Marital status: Single    Spouse name: Not on file  . Number of children: 1  . Years of education: BS  . Highest education level: Not on file  Occupational History  . Occupation: Garment/textile technologist  Tobacco Use  . Smoking status: Never Smoker  . Smokeless tobacco: Never Used  Vaping Use  . Vaping Use: Never used  Substance and Sexual Activity  . Alcohol use: Yes    Alcohol/week: 1.0 - 2.0  standard drink    Types: 1 - 2 Standard drinks or equivalent per week    Comment: occasional  . Drug use: No  . Sexual activity: Yes  Other Topics Concern  . Not on file  Social History Narrative   Lives at home with her mother and son.   Right-handed.   Occasional soda.   Social Determinants of Health   Financial Resource Strain: Not on file  Food Insecurity: Not on file  Transportation Needs: Not on file  Physical Activity: Not on file  Stress: Not on file  Social Connections: Not on file  Intimate Partner Violence: Not on file   PHYSICAL EXAM  Vitals:   05/14/21 1050  BP: 103/63  Pulse: (!) 50  Weight: 192 lb (87.1 kg)  Height: 5\' 7"  (1.702 m)   Body mass index is 30.07 kg/m.  Generalized: Well developed, in no acute distress   Neurological examination  Mentation: Alert oriented to time, place, history taking. Follows all commands speech and language fluent Cranial nerve II-XII: Pupils were equal round reactive to light. Extraocular movements were full, visual field were full on confrontational test. Facial sensation and strength were normal. Head turning and shoulder shrug  were normal and symmetric. Motor: The motor testing reveals 5 over 5 strength of all 4 extremities. Good symmetric motor tone is noted throughout.  Sensory: Sensory testing is intact to soft touch on all 4 extremities. No evidence of extinction is noted.  Coordination: Cerebellar testing reveals good finger-nose-finger and heel-to-shin bilaterally.  Gait and station: Gait is normal.  Reflexes: Deep tendon reflexes are symmetric and normal bilaterally.   DIAGNOSTIC DATA (LABS, IMAGING, TESTING) - I reviewed patient records, labs, notes, testing and imaging myself where available.  Lab Results  Component Value Date   WBC 4.2 04/03/2019   HGB 12.9 04/03/2019   HCT 40.3 04/03/2019   MCV 92.9 04/03/2019   PLT 370 04/03/2019      Component Value Date/Time   NA 136 04/03/2019 1851   NA 137  04/05/2017 1034   K 4.2 04/03/2019 1851   CL 104 04/03/2019 1851   CO2 24 04/03/2019 1851   GLUCOSE 87 04/03/2019 1851   BUN 15 04/03/2019 1851   BUN 9 04/05/2017 1034   CREATININE 0.82 04/03/2019 1851   CALCIUM 9.1 04/03/2019 1851   PROT 8.1 01/11/2019 1127   PROT 7.6 04/05/2017 1034   ALBUMIN 4.3 01/11/2019 1127   ALBUMIN 4.6 04/05/2017 1034   AST 13 (L) 01/11/2019 1127   ALT 11 01/11/2019 1127   ALKPHOS 34 (L) 01/11/2019 1127   BILITOT 1.0 01/11/2019 1127   BILITOT 1.3 (H) 04/05/2017 1034   GFRNONAA >60 04/03/2019 1851  GFRAA >60 04/03/2019 1851   No results found for: CHOL, HDL, LDLCALC, LDLDIRECT, TRIG, CHOLHDL No results found for: HGBA1C No results found for: VITAMINB12 Lab Results  Component Value Date   TSH 3.170 01/12/2019    ASSESSMENT AND PLAN 45 y.o. year old female  has a past medical history of Anemia, Asthma, Atrial fibrillation with rapid ventricular response ([place]) (01/12/2019), Bradycardia (08/13/2016), Headache, HSV infection, Hypothyroidism, Iron deficiency, Migraine, Tachycardia, Tingling, Vaginal Pap smear, abnormal, and Vitamin D deficiency. here with:  1.  Chronic migraine headache 2.  Left-sided paresthesia -Well-controlled, knows triggers, happy with control -Continue Nurtec 75 mg tablet for acute headache, works much more consistently/immediate than Imitrex -Will remain off preventative medications for now, does not feel frequent enough to justify -MRI of the brain showed no significant abnormality -Follow-up in 1 year or sooner if needed  Butler Denmark, AGNP-C, DNP 05/14/2021, 11:06 AM [facility] [address], [place], [place] [address] [phone]

## 2021-08-20 NOTE — Progress Notes (Signed)
Chart reviewed, agree above plan ?

## 2021-10-07 ENCOUNTER — Other Ambulatory Visit (HOSPITAL_COMMUNITY): Payer: Self-pay | Admitting: Physician Assistant

## 2022-01-07 ENCOUNTER — Other Ambulatory Visit (HOSPITAL_COMMUNITY): Payer: Self-pay | Admitting: *Deleted

## 2022-01-07 MED ORDER — METOPROLOL SUCCINATE ER 25 MG PO TB24: 12.5000 mg | ORAL_TABLET | Freq: Every day | ORAL | 1 refills | Status: DC

## 2022-01-12 ENCOUNTER — Other Ambulatory Visit (HOSPITAL_COMMUNITY)
Admission: RE | Admit: 2022-01-12 | Discharge: 2022-01-12 | Disposition: A | Discharge status: Home / Self Care | Payer: Managed Care, Other (non HMO) | Source: Ambulatory Visit | Attending: Obstetrics and Gynecology | Admitting: Obstetrics and Gynecology

## 2022-01-12 ENCOUNTER — Other Ambulatory Visit: Payer: Self-pay | Admitting: Obstetrics and Gynecology

## 2022-01-12 DIAGNOSIS — Z01419 Encounter for gynecological examination (general) (routine) without abnormal findings: Secondary | ICD-10-CM | POA: Diagnosis not present

## 2022-01-14 ENCOUNTER — Ambulatory Visit (HOSPITAL_COMMUNITY)
Admission: RE | Admit: 2022-01-14 | Discharge: 2022-01-14 | Disposition: A | Discharge status: Home / Self Care | Payer: Managed Care, Other (non HMO) | Source: Ambulatory Visit | Attending: Physician Assistant | Admitting: Physician Assistant

## 2022-01-14 ENCOUNTER — Encounter (HOSPITAL_COMMUNITY): Payer: Self-pay | Admitting: Physician Assistant

## 2022-01-14 ENCOUNTER — Other Ambulatory Visit: Payer: Self-pay

## 2022-01-14 VITALS — BP 104/72 | HR 64 | Ht 67.0 in | Wt 184.0 lb

## 2022-01-14 DIAGNOSIS — J45909 Unspecified asthma, uncomplicated: Secondary | ICD-10-CM | POA: Insufficient documentation

## 2022-01-14 DIAGNOSIS — I4892 Unspecified atrial flutter: Secondary | ICD-10-CM | POA: Diagnosis not present

## 2022-01-14 DIAGNOSIS — Z7901 Long term (current) use of anticoagulants: Secondary | ICD-10-CM | POA: Insufficient documentation

## 2022-01-14 DIAGNOSIS — E039 Hypothyroidism, unspecified: Secondary | ICD-10-CM | POA: Insufficient documentation

## 2022-01-14 DIAGNOSIS — I480 Paroxysmal atrial fibrillation: Secondary | ICD-10-CM | POA: Diagnosis not present

## 2022-01-14 LAB — CYTOLOGY - PAP
Comment: NEGATIVE
Diagnosis: UNDETERMINED — AB
High risk HPV: NEGATIVE

## 2022-01-14 MED ORDER — FLECAINIDE ACETATE 50 MG PO TABS: 50.0000 mg | ORAL_TABLET | Freq: Two times a day (BID) | ORAL | 3 refills | Status: DC

## 2022-01-14 MED ORDER — METOPROLOL SUCCINATE ER 25 MG PO TB24: 12.5000 mg | ORAL_TABLET | Freq: Every day | ORAL | 3 refills | Status: DC

## 2022-01-14 NOTE — Progress Notes (Signed)
Primary Care Physician: Donald Prose, MD Primary Cardiologist: Dr Radford Pax Primary Electrophysiologist: Dr Rayann Heman Referring Physician: Dr Maren Beach Janice Flores is a 46 y.o. female with a history of asthma, hypothyroidism, and paroxysmal atrial fibrillation who presents for follow up in the [facility]. Patient reports that in February she woke in the middle of the night with palpations and SOB. She presented to the ER and was found to be in afib with RVR. She was given metoprolol on discharge and patient reports she spontaneously converted to SR later that day. She had another episode of heart racing on 04/03/19 and again went to the ER and was found to be in atrial flutter with variable conduction. Cardioversion was not pursued at that time. Patient feels that she has been persistently out of rhythm since her ER visit with nearly constant symptoms of palpitations, periodic lightheadedness, and SOB on exertion. Her Apple Watch has shown labile HR up to 140s. She denies snoring or significant alcohol use. There were no triggers during each of these episodes that the patient could identify. She was started on flecainide on 05/22/19.  On follow up today, patient reports that she has done very well since her last visit. She had one episode of palpitations which lasted < 2 minutes. She remains physically active.   Today, she denies symptoms of palpitations, chest pain, shortness of breath, orthopnea, PND, lower extremity edema, dizziness, presyncope, syncope, snoring, daytime somnolence, bleeding, or neurologic sequela. The patient is tolerating medications without difficulties and is otherwise without complaint today.    Atrial Fibrillation Risk Factors:  she does not have symptoms or diagnosis of sleep apnea. Negative sleep study. she does not have a history of rheumatic fever. she does not have a history of alcohol use. The patient does have a history of early  familial atrial fibrillation or other arrhythmias. Mother has afib.  she has a BMI of Body mass index is 28.82 kg/m.Marland Kitchen Filed Weights   01/14/22 0843  Weight: 83.5 kg     Family History  Problem Relation Age of Onset   Hypertension Father    Hypercholesterolemia Mother    Sarcoidosis Mother    Asthma Brother    Sickle cell trait Brother    Breast cancer Neg Hx      Atrial Fibrillation Management history:  Previous antiarrhythmic drugs: flecainide Previous cardioversions: none Previous ablations: none CHADS2VASC score: 1 Anticoagulation history: Eliquis   Past Medical History:  Diagnosis Date   Anemia    Asthma    Atrial fibrillation with rapid ventricular response ([place]) 01/12/2019   Bradycardia 08/13/2016   Headache    HSV infection    Hypothyroidism    Iron deficiency    Migraine    Tachycardia    intermittent tachycardia   Tingling    Vaginal Pap smear, abnormal    colposcopy    Vitamin D deficiency    Past Surgical History:  Procedure Laterality Date   CESAREAN SECTION N/A 04/21/2015   Procedure: CESAREAN SECTION;  Surgeon: Everett Graff, MD;  Location: [facility];  Service: Obstetrics;  Laterality: N/A;   removal of polyp from uterus      Current Outpatient Medications  Medication Sig Dispense Refill   albuterol (VENTOLIN HFA) 108 (90 Base) MCG/ACT inhaler      Ascorbic Acid (VITAMIN C) 1000 MG tablet Take 2,000 mg by mouth daily.     cetirizine (ZYRTEC) 10 MG chewable tablet Chew 10 mg by mouth as  needed.      DUPIXENT 300 MG/2ML SOPN Inject into the skin.     levothyroxine (SYNTHROID, LEVOTHROID) 75 MCG tablet Take 75 mcg by mouth daily before breakfast.     montelukast (SINGULAIR) 10 MG tablet Take 10 mg by mouth at bedtime.     Multiple Vitamins-Minerals (MULTIVITAMIN ADULT PO) Take 1 tablet by mouth daily.      Rimegepant Sulfate (NURTEC) 75 MG TBDP Take 75 mg by mouth as needed (Take 1 at onset of headache, max is 1 tablet in 24 hours). 12 tablet 11    tacrolimus (PROTOPIC) 0.1 % ointment SMARTSIG:Topical 1 to 2 Times Daily PRN     flecainide (TAMBOCOR) 50 MG tablet Take 1 tablet (50 mg total) by mouth 2 (two) times daily. 180 tablet 3   metoprolol succinate (TOPROL-XL) 25 MG 24 hr tablet Take 0.5 tablets (12.5 mg total) by mouth daily. 45 tablet 3   No current facility-administered medications for this encounter.    Allergies  Allergen Reactions   Other Swelling    cherries    Social History   Socioeconomic History   Marital status: Single    Spouse name: Not on file   Number of children: 1   Years of education: BS   Highest education level: Not on file  Occupational History   Occupation: Garment/textile technologist  Tobacco Use   Smoking status: Never   Smokeless tobacco: Never  Vaping Use   Vaping Use: Never used  Substance and Sexual Activity   Alcohol use: Yes    Alcohol/week: 1.0 - 2.0 standard drink    Types: 1 - 2 Standard drinks or equivalent per week    Comment: occasional   Drug use: No   Sexual activity: Yes  Other Topics Concern   Not on file  Social History Narrative   Lives at home with her mother and son.   Right-handed.   Occasional soda.   Social Determinants of Health   Financial Resource Strain: Not on file  Food Insecurity: Not on file  Transportation Needs: Not on file  Physical Activity: Not on file  Stress: Not on file  Social Connections: Not on file  Intimate Partner Violence: Not on file     ROS- All systems are reviewed and negative except as per the HPI above.  Physical Exam: Vitals:   01/14/22 0843  BP: 104/72  Pulse: 64  Weight: 83.5 kg  Height: 5\' 7"  (1.702 m)    GEN- The patient is a well appearing female, alert and oriented x 3 today.   HEENT-head normocephalic, atraumatic, sclera clear, conjunctiva pink, hearing intact, trachea midline. Lungs- Clear to ausculation bilaterally, normal work of breathing Heart- Regular rate and rhythm, no murmurs, rubs or gallops  GI-  soft, NT, ND, + BS Extremities- no clubbing, cyanosis, or edema MS- no significant deformity or atrophy Skin- no rash or lesion Psych- euthymic mood, full affect Neuro- strength and sensation are intact   Wt Readings from Last 3 Encounters:  01/14/22 83.5 kg  05/14/21 87.1 kg  03/12/21 88.2 kg    EKG today demonstrates  SR Vent. rate 64 BPM PR interval 162 ms QRS duration 96 ms QT/QTcB 406/418 ms  Echo 01/18/19 demonstrated  1. The left ventricle has hyperdynamic systolic function, with an ejection fraction of >65%. The cavity size was normal. Left ventricular diastolic parameters were normal.  2. The right ventricle has normal systolic function. The cavity was normal. There is no increase in right ventricular  wall thickness.  3. The mitral valve is normal in structure.  4. The tricuspid valve is normal in structure.  5. The aortic valve is normal in structure. no stenosis of the aortic valve.  6. The aortic root and ascending aorta are normal in size and structure.  7. The interatrial septum was not assessed.  Epic records are reviewed at length today  CHA2DS2-VASc Score = 1  The patient's score is based upon: CHF History: 0 HTN History: 0 Diabetes History: 0 Stroke History: 0 Vascular Disease History: 0 Age Score: 0 Gender Score: 1       ASSESSMENT AND PLAN: 1. Paroxysmal Atrial Fibrillation/atrial flutter The patient's CHA2DS2-VASc score is 1, indicating a 0.6% annual risk of stroke.   Patient appears to be maintaining SR. Continue flecainide 50 mg BID Continue Toprol 12.5 mg daily Anticoagulation not indicated at this time with low CV score    Follow up with Dr Radford Pax in 6 months. AF clinic in one year.    [facility] [address] [place], [place] [address] [phone] 01/14/2022 9:03 AM

## 2022-04-01 DIAGNOSIS — J029 Acute pharyngitis, unspecified: Secondary | ICD-10-CM | POA: Diagnosis not present

## 2022-04-01 DIAGNOSIS — Z03818 Encounter for observation for suspected exposure to other biological agents ruled out: Secondary | ICD-10-CM | POA: Diagnosis not present

## 2022-04-01 DIAGNOSIS — H9209 Otalgia, unspecified ear: Secondary | ICD-10-CM | POA: Diagnosis not present

## 2022-04-01 DIAGNOSIS — J020 Streptococcal pharyngitis: Secondary | ICD-10-CM | POA: Diagnosis not present

## 2022-04-13 ENCOUNTER — Encounter: Payer: Self-pay | Admitting: Neurology

## 2022-04-13 NOTE — Telephone Encounter (Signed)
Able to locate PA form online. Printed, filled out and waiting on NP signature. Will fax in once signed. ?

## 2022-04-13 NOTE — Telephone Encounter (Signed)
Tried submitted on CMM. Unable to locate pt. I tried calling [employer] to initiate PA at [phone]. They were closed.  ?

## 2022-04-14 ENCOUNTER — Telehealth: Payer: Self-pay | Admitting: *Deleted

## 2022-04-14 NOTE — Telephone Encounter (Addendum)
PA for Nurtec '75mg'$  started on covermymeds (key: [id number]). Pharmacy coverage through [place] [phone]). Decision pending. ? ?Her plan only allows #8/30. If approved, new rx for this quantity will need to be sent to pharmacy.  ?

## 2022-04-15 NOTE — Telephone Encounter (Signed)
Received a fax from [employer]. They required additional information over the phone. Called [phone] and completed questions. Determination should be made in 1-2 business days.  ?

## 2022-04-16 NOTE — Telephone Encounter (Signed)
PA approved. Effective from 04/14/2022 through 04/13/2023. ?

## 2022-05-18 NOTE — Progress Notes (Signed)
PATIENT: Janice Flores DOB: 09/29/1976  REASON FOR VISIT: follow up for migraines HISTORY FROM: patient PRIMARY NEUROLOGIST: Dr. Krista Blue  HISTORY Janice Flores is a 46 year old right-handed female alone at today's clinical visit, seen in refer by  her primary care physician Dr.Vyvyan Sun for evaluation of headaches on August 10, 2019, I saw her previously on November 06 2015 for migraine   I have reviewed and summarize her most recent office note, she had a history of hypothyroidism, intermittent asthma, iron deficiency anemia, vitamin D deficiency, idiopathic scoliosis of lumbar region, history of herpes simplex type II, PVCs   She started to have headaches since 2013, she reported sudden onset transient sharp headache lasting for few seconds with sudden positional change, for a while, it has improved, but since October 2016, she began to have frequent occurrence, multiple episodes in a day, usually happened with sudden positional change, turning her head, bending over, getting up from bed   In addition she also had a history of migraine headaches, above-mentioned headache is different from her typical migraine, her typical migraine left frontal behind eye severe pounding headache with associated light noise sensitivity, nauseous, lasting for 1 day,    She is currently still nursing her 27-monthold son, tried Tylenol for migraine with limited help, she was started on preventive medication magnesium oxide, riboflavin, which has been helpful, 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, no evidence of compression, headache was much improved, has lost follow-up,   She returns today for worsening headache since March 2020   Her headache is usually triggered by strong smells, such as perfume, cleaning agent, exertion, sudden head movement, her typical headache left parietal retro-orbital region pounding headache with associated light, noise sensitivity, nauseous, ringing in ears,  lasting for few hours, she also complains of intermittent left face, left finger paresthesia   She is having migraine headaches 3-4 times each week, tried Imitrex without significant improvement,   Update October 26, 2019, She is doing well, still has intermittent left lower face numbness, sometimes upper extremity paresthesia, only lasting for few minutes, no weakness,   She has migraine headaches every couple months, responding well to Imitrex   Personally reviewed MRI of the brain in September 2020, partially empty sella, no intracranial abnormality   Update November 13, 2019 SS: She was recently seen by Dr. YKrista Blue3 weeks ago.  She reports she has continued to do well, has had less frequent paresthesia and headaches.  Has had only had 2 migraines in September, reports excellent benefit with Imitrex.  She is working with cardiology to decrease her dose of metoprolol.  She thinks the decreased dose has resulted in less episodes of intermittent left lower face numbness, paresthesia to left upper extremity.  She is now taking metoprolol 12.5 mg daily.  The frequency of paresthesias is happening less than once a week, is much improved.  She presents today for evaluation unaccompanied.  She has not actually started nortriptyline, has not needed up to this point.   Update May 13, 2020 SS: Headaches remain overall well controlled, at worse 1-2 a month, recently last 2 weeks, has had 3 headaches, brought on by weather change.  Headaches start under the left eye, as pressure, will gradually spread up to the top of head (once at this point is worst).  Will take Imitrex with good benefit.  Occasionally, will get left sided facial paresthesia, numbness-not nearly as significant or frequent, previously thought to be related to higher  dose metoprolol, now taking 12.5 mg daily.  Never started nortriptyline. Headaches triggered by smells. Has 3 year old son.   Update November 12, 2020 SS: Headaches remain well  controlled, could go a month with no headache, then have a few days of cluster headache. Had headache last weekend 3 days, taking 2 doses of Imitrex did not relieve.  It does make her nauseated.  Known triggers are smells, but is not the case when wakes up with headache. 1 spell of A. fib since last seen.  Does not feel headaches are frequent enough to be on preventive medication.  Update May 14, 2021  SS: Headaches remain well controlled, overcast weather days, certain smells are triggers. Frequency depends on triggers. Taking Nurtec for acute headache works really well/fast, hasn't taken Imitrex at all. Nurtec filled in December # 12 tablets, only filled twice since. Migraines start left temple area, localize to left vertex when really bad. Headaches have calmed down a lot more. Is more aware of her triggers. No AFIB episodes recently.   Update May 19, 2022 SS: Doing well, last month 2 migraines, takes Nurtec, eases off within 1 hr, usually occur in AM. Sees cardiology, on metoprolol. No health issues. Her son is 7, doing well.  Not on preventative.  REVIEW OF SYSTEMS: Out of a complete 14 system review of symptoms, the patient complains only of the following symptoms, and all other reviewed systems are negative.  See HPI  ALLERGIES: Allergies  Allergen Reactions   Other Swelling    cherries    HOME MEDICATIONS: Outpatient Medications Prior to Visit  Medication Sig Dispense Refill   albuterol (VENTOLIN HFA) 108 (90 Base) MCG/ACT inhaler      Ascorbic Acid (VITAMIN C) 1000 MG tablet Take 2,000 mg by mouth daily.     cetirizine (ZYRTEC) 10 MG chewable tablet Chew 10 mg by mouth as needed.      DUPIXENT 300 MG/2ML SOPN Inject into the skin.     flecainide (TAMBOCOR) 50 MG tablet Take 1 tablet (50 mg total) by mouth 2 (two) times daily. 180 tablet 3   levothyroxine (SYNTHROID, LEVOTHROID) 75 MCG tablet Take 75 mcg by mouth daily before breakfast.     metoprolol succinate (TOPROL-XL) 25 MG 24  hr tablet Take 0.5 tablets (12.5 mg total) by mouth daily. 45 tablet 3   montelukast (SINGULAIR) 10 MG tablet Take 10 mg by mouth at bedtime.     Multiple Vitamins-Minerals (MULTIVITAMIN ADULT PO) Take 1 tablet by mouth daily.      tacrolimus (PROTOPIC) 0.1 % ointment SMARTSIG:Topical 1 to 2 Times Daily PRN     Rimegepant Sulfate (NURTEC) 75 MG TBDP Take 75 mg by mouth as needed (Take 1 at onset of headache, max is 1 tablet in 24 hours). 12 tablet 11   No facility-administered medications prior to visit.    PAST MEDICAL HISTORY: Past Medical History:  Diagnosis Date   Anemia    Asthma    Atrial fibrillation with rapid ventricular response ([place]) 01/12/2019   Bradycardia 08/13/2016   Headache    HSV infection    Hypothyroidism    Iron deficiency    Migraine    Tachycardia    intermittent tachycardia   Tingling    Vaginal Pap smear, abnormal    colposcopy    Vitamin D deficiency     PAST SURGICAL HISTORY: Past Surgical History:  Procedure Laterality Date   CESAREAN SECTION N/A 04/21/2015   Procedure: CESAREAN SECTION;  Surgeon:  Everett Graff, MD;  Location: [facility];  Service: Obstetrics;  Laterality: N/A;   removal of polyp from uterus      FAMILY HISTORY: Family History  Problem Relation Age of Onset   Hypertension Father    Hypercholesterolemia Mother    Sarcoidosis Mother    Asthma Brother    Sickle cell trait Brother    Breast cancer Neg Hx     SOCIAL HISTORY: Social History   Socioeconomic History   Marital status: Single    Spouse name: Not on file   Number of children: 1   Years of education: BS   Highest education level: Not on file  Occupational History   Occupation: Garment/textile technologist  Tobacco Use   Smoking status: Never   Smokeless tobacco: Never  Vaping Use   Vaping Use: Never used  Substance and Sexual Activity   Alcohol use: Yes    Alcohol/week: 1.0 - 2.0 standard drink of alcohol    Types: 1 - 2 Standard drinks or equivalent per week     Comment: occasional   Drug use: No   Sexual activity: Yes  Other Topics Concern   Not on file  Social History Narrative   Lives at home with her mother and son.   Right-handed.   Occasional soda.   Social Determinants of Health   Financial Resource Strain: Not on file  Food Insecurity: Not on file  Transportation Needs: Not on file  Physical Activity: Not on file  Stress: Not on file  Social Connections: Not on file  Intimate Partner Violence: Not on file   PHYSICAL EXAM  Vitals:   05/19/22 1055  BP: 135/74  Pulse: (!) 54  Weight: 184 lb (83.5 kg)  Height: '5\' 7"'$  (1.702 m)   Body mass index is 28.82 kg/m.  Generalized: Well developed, in no acute distress   Neurological examination  Mentation: Alert oriented to time, place, history taking. Follows all commands speech and language fluent Cranial nerve II-XII: Pupils were equal round reactive to light. Extraocular movements were full, visual field were full on confrontational test. Facial sensation and strength were normal. Head turning and shoulder shrug  were normal and symmetric. Motor: The motor testing reveals 5 over 5 strength of all 4 extremities. Good symmetric motor tone is noted throughout.  Sensory: Sensory testing is intact to soft touch on all 4 extremities. No evidence of extinction is noted.  Coordination: Cerebellar testing reveals good finger-nose-finger and heel-to-shin bilaterally.  Gait and station: Gait is normal.  Tandem gait is normal.  DIAGNOSTIC DATA (LABS, IMAGING, TESTING) - I reviewed patient records, labs, notes, testing and imaging myself where available.  Lab Results  Component Value Date   WBC 4.2 04/03/2019   HGB 12.9 04/03/2019   HCT 40.3 04/03/2019   MCV 92.9 04/03/2019   PLT 370 04/03/2019      Component Value Date/Time   NA 136 04/03/2019 1851   NA 137 04/05/2017 1034   K 4.2 04/03/2019 1851   CL 104 04/03/2019 1851   CO2 24 04/03/2019 1851   GLUCOSE 87 04/03/2019 1851    BUN 15 04/03/2019 1851   BUN 9 04/05/2017 1034   CREATININE 0.82 04/03/2019 1851   CALCIUM 9.1 04/03/2019 1851   PROT 8.1 01/11/2019 1127   PROT 7.6 04/05/2017 1034   ALBUMIN 4.3 01/11/2019 1127   ALBUMIN 4.6 04/05/2017 1034   AST 13 (L) 01/11/2019 1127   ALT 11 01/11/2019 1127   ALKPHOS 34 (L) 01/11/2019 1127  BILITOT 1.0 01/11/2019 1127   BILITOT 1.3 (H) 04/05/2017 1034   GFRNONAA >60 04/03/2019 1851   GFRAA >60 04/03/2019 1851   No results found for: "CHOL", "HDL", "LDLCALC", "LDLDIRECT", "TRIG", "CHOLHDL" No results found for: "HGBA1C" No results found for: "VITAMINB12" Lab Results  Component Value Date   TSH 3.170 01/12/2019   ASSESSMENT AND PLAN 45 y.o. year old female  1.  Chronic migraine headache 2.  Left-sided paresthesia -Under good control, continue Nurtec as needed for acute headache -On metoprolol, which could potentially benefit headaches -Follow-up in 1 year or sooner if needed, 15 min VV  Butler Denmark, AGNP-C, DNP 05/19/2022, 11:31 AM [facility] [address], [place], [place] [address] [phone]

## 2022-05-19 ENCOUNTER — Encounter: Payer: Self-pay | Admitting: Neurology

## 2022-05-19 ENCOUNTER — Ambulatory Visit: Payer: BC Managed Care – PPO | Admitting: Neurology

## 2022-05-19 VITALS — BP 135/74 | HR 54 | Ht 67.0 in | Wt 184.0 lb

## 2022-05-19 DIAGNOSIS — G43709 Chronic migraine without aura, not intractable, without status migrainosus: Secondary | ICD-10-CM | POA: Diagnosis not present

## 2022-05-19 MED ORDER — NURTEC 75 MG PO TBDP: 75.0000 mg | ORAL_TABLET | ORAL | 11 refills | Status: DC | PRN

## 2022-06-05 ENCOUNTER — Other Ambulatory Visit: Payer: Self-pay | Admitting: Neurology

## 2022-08-17 ENCOUNTER — Other Ambulatory Visit (HOSPITAL_COMMUNITY): Payer: Self-pay | Admitting: *Deleted

## 2022-08-17 MED ORDER — METOPROLOL SUCCINATE ER 25 MG PO TB24: 12.5000 mg | ORAL_TABLET | Freq: Every day | ORAL | 3 refills | Status: DC

## 2022-08-18 ENCOUNTER — Telehealth: Payer: Self-pay

## 2022-08-18 NOTE — Telephone Encounter (Signed)
(  Key: [id number])  Your information has been submitted to [employer]. To check for an updated outcome later, reopen this PA request from your dashboard.  If [employer] has not responded to your request within 24 hours, contact [place] at [phone]. If you think there may be a problem with your PA request, use our live chat feature at the bottom right.

## 2022-08-20 NOTE — Telephone Encounter (Signed)
PA approval received via fax 08/18/22-08/19/23

## 2022-09-29 NOTE — Progress Notes (Unsigned)
Office Visit    Patient Name: Janice Flores Date of Encounter: 09/30/2022  Primary Care Provider:  Donald Prose, MD Primary Cardiologist:  Fransico Him, MD Primary Electrophysiologist: None  Chief Complaint    Janice Flores is a 46 y.o. female with PMH of PAF, HTN, asthma, IDA, chronic headaches, bradycardia, hypothyroidism who presents today for 94-monthfollow-up of atrial fibrillation.  Past Medical History    Past Medical History:  Diagnosis Date   Anemia    Asthma    [place] 01/12/2019   Bradycardia 08/13/2016   Headache    HSV infection    Hypothyroidism    Iron deficiency    Migraine    Tachycardia    intermittent tachycardia   Tingling    Vaginal Pap smear, abnormal    colposcopy    Vitamin D deficiency    Past Surgical History:  Procedure Laterality Date   CESAREAN SECTION N/A 04/21/2015   Procedure: CESAREAN SECTION;  Surgeon: AEverett Graff MD;  [facility];  Service: Obstetrics;  Laterality: N/A;   removal of polyp from uterus      Allergies  Allergies  Allergen Reactions   Other Swelling    cherries    History of Present Illness    Janice DEssie ChristineCrumdy  is a 46year old female with the above mention past medical history who presents today for follow-up of atrial fibrillation.  She was initially seen by Dr. TRadford Paxin for complaint of chest pain, shortness of breath and palpitations.  She completed a ETT that showed no ischemia and 12 METS with normal blood pressure response to exercise.  She wore 24-hour Holter monitor that showed PVCs she was started on Toprol for suppression.  She was seen in the ED 11/2016 with complaint of atypical chest pain and troponins were negative and was found to be mildly anemic.  In 2020 she presented to the ED with new onset atrial fibrillation.  She was treated with Lopressor IV and heart rate reduced to the 80s.  D-dimer was elevated and CTA of the  chest showed no evidence of PE. She was not anticoagulated because of a CHADS2VASC score of 1 (female).     She was referred to the AF clinic for further management on 03/2019.  2D echo was completed that showed normal EF of 65%, with no RWMA or valvular abnormalities.  She was started on Eliquis 5 mg and discussed starting flecainide.  She underwent coronary CTA to rule out any underlying coronary artery disease prior to starting antiarrhythmic medication.  CT results showed calcium score 0 with normal coronaries no evidence of CAD.  She was started on flecainide and Eliquis was discontinued.  She had complaints of bradycardia and wore 7-day Zio patch on 11/2019.  Event monitor showed sinus rhythm with occasional PACs and nonsustained atrial tach with no A-fib.  She was given a repeat monitor for 20 days for further evaluation.  She had also complained of chest tightness and was referred to pulmonary and advised to follow-up with PCP regarding PPI for GERD.  Repeat event monitor showed NSR with short burst of VT while sleeping. She was last seen in the AF clinic on 01/2022 and reported no palpitations at that time.  She was maintaining sinus rhythm and continued on flecainide 50 mg and Toprol 12.5 mg anticoagulation still not indicated due to low CV risk score.  [name] today alone for 627-monthollow-up of  atrial fibrillation.  Since last being seen in the office patient reports she has been doing well with 1-2 recurrences of short-lived palpitations since her previous visit.  She is compliant with current medications and denies any adverse reactions.  She is currently working a new job that is  sedentary in nature.  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  She notes occasional isolated episodes of dizziness and also 1-2 episodes of chest tightness that was extremely short-lived and resolved with rest.  She was advised to inform us if  chest discomfort occurs and is more frequent.  Patient denies chest pain, palpitations, dyspnea, PND, orthopnea, nausea, vomiting, dizziness, syncope, edema, weight gain, or early satiety.    Home Medications    Current Outpatient Medications  Medication Sig Dispense Refill   albuterol (VENTOLIN HFA) 108 (90 Base) MCG/ACT inhaler Inhale 1 puff into the lungs as directed. Colds/Allergies     cetirizine (ZYRTEC) 10 MG chewable tablet Chew 10 mg by mouth as needed.      clobetasol ointment (TEMOVATE) 7.10 % Apply 1 Application topically as directed. Eczema     flecainide (TAMBOCOR) 50 MG tablet Take 1 tablet (50 mg total) by mouth 2 (two) times daily. 180 tablet 3   levothyroxine (SYNTHROID, LEVOTHROID) 75 MCG tablet Take 75 mcg by mouth daily before breakfast.     metoprolol succinate (TOPROL-XL) 25 MG 24 hr tablet Take 0.5 tablets (12.5 mg total) by mouth daily. 45 tablet 3   Rimegepant Sulfate (NURTEC) 75 MG TBDP Take 75 mg by mouth as needed (Take 1 at onset of headache, max is 1 tablet in 24 hours). 12 tablet 11   tacrolimus (PROTOPIC) 0.1 % ointment SMARTSIG:Topical 1 to 2 Times Daily PRN     No current facility-administered medications for this visit.     Review of Systems  Please see the history of present illness.    (+) Dizziness All other systems reviewed and are otherwise negative except as noted above.  Physical Exam    Wt Readings from Last 3 Encounters:  09/30/22 201 lb 12.8 oz (91.5 kg)  05/19/22 184 lb (83.5 kg)  01/14/22 184 lb (83.5 kg)   VS: Vitals:   09/30/22 1454  BP: 116/68  Pulse: 66  SpO2: 97%  ,Body mass index is 31.61 kg/m.  Constitutional:      Appearance: Healthy appearance. Not in distress.  Neck:     Vascular: JVD normal.  Pulmonary:     Effort: Pulmonary effort is normal.     Breath sounds: No wheezing. No rales. Diminished in the bases Cardiovascular:     Normal rate. Regular rhythm. Normal S1. Normal S2.      Murmurs: There is no murmur.   Edema:    Peripheral edema absent.  Abdominal:     Palpations: Abdomen is soft non tender. There is no hepatomegaly.  Skin:    General: Skin is warm and dry.  Neurological:     General: No focal deficit present.     Mental Status: Alert and oriented to person, place and time.     Cranial Nerves: Cranial nerves are intact.  EKG/LABS/Other Studies Reviewed    ECG personally reviewed by me today -sinus rhythm with rate of 6 6 bpm and no acute changes consistent to previous EKG.  Risk Assessment/Calculations:    CHA2DS2-VASc Score = 1   This indicates a 0.6% annual risk of stroke. The patient's score  is based upon: CHF History: 0 HTN History: 0 Diabetes History: 0 Stroke History: 0 Vascular Disease History: 0 Age Score: 0 Gender Score: 1     Lab Results  Component Value Date   WBC 4.2 04/03/2019   HGB 12.9 04/03/2019   HCT 40.3 04/03/2019   MCV 92.9 04/03/2019   PLT 370 04/03/2019   Lab Results  Component Value Date   CREATININE 0.82 04/03/2019   BUN 15 04/03/2019   NA 136 04/03/2019   K 4.2 04/03/2019   CL 104 04/03/2019   CO2 24 04/03/2019   Lab Results  Component Value Date   ALT 11 01/11/2019   AST 13 (L) 01/11/2019   ALKPHOS 34 (L) 01/11/2019   BILITOT 1.0 01/11/2019   No results found for: "CHOL", "HDL", "LDLCALC", "LDLDIRECT", "TRIG", "CHOLHDL"  No results found for: "HGBA1C"  Assessment & Plan    1.  Paroxysmal atrial fibrillation: -CHA2DS2-VASc score is 1, currently not indicated for anticoagulation -She is currently on antiarrhythmic therapy with flecainide 50 mg twice daily and Toprol XL 12.5 mg daily -QTc currently is normal at 422 ms -She reports no recurrence of tachyarrhythmia since her previous appointment.   2.  Essential hypertension: -Patient's blood pressure today was well controlled at 116/68 -Encouraged to continue low-sodium heart healthy diet. -Continue Toprol-XL 12.5 mg daily   3.  Shortness of breath: -Patient reports that  her shortness of breath has resolved and no longer occurs with activity. -Patient advised to report to [place] if shortness of breath becomes more consistent and is not relieved with rest.   Disposition: Follow-up with Fransico Him, MD or APP in 12 months    Medication Adjustments/Labs and Tests Ordered: Current medicines are reviewed at length with the patient today.  Concerns regarding medicines are outlined above.   Signed, Mable Fill, Janice Nestle, NP 09/30/2022, 3:14 PM [facility]  Note:  This document was prepared using Dragon voice recognition software and may include unintentional dictation errors.

## 2022-09-30 ENCOUNTER — Encounter: Payer: Self-pay | Admitting: Nurse Practitioner

## 2022-09-30 ENCOUNTER — Ambulatory Visit: Payer: 59 | Attending: Nurse Practitioner | Admitting: Nurse Practitioner

## 2022-09-30 VITALS — BP 116/68 | HR 66 | Ht 67.0 in | Wt 201.8 lb

## 2022-09-30 DIAGNOSIS — R0602 Shortness of breath: Secondary | ICD-10-CM

## 2022-09-30 DIAGNOSIS — I4891 Unspecified atrial fibrillation: Secondary | ICD-10-CM

## 2022-09-30 DIAGNOSIS — I10 Essential (primary) hypertension: Secondary | ICD-10-CM | POA: Diagnosis not present

## 2022-09-30 NOTE — Patient Instructions (Signed)
Medication Instructions:  Your physician recommends that you continue on your current medications as directed. Please refer to the Current Medication list given to you today.  *If you need a refill on your cardiac medications before your next appointment, please call your pharmacy*  Follow-Up: At [facility], you and your health needs are our priority.  As part of our continuing mission to provide you with exceptional heart care, we have created designated Provider Care Teams.  These Care Teams include your primary Cardiologist (physician) and Advanced Practice Providers (APPs -  Physician Assistants and Nurse Practitioners) who all work together to provide you with the care you need, when you need it.  Your next appointment:   1 year(s)  The format for your next appointment:   In Person  Provider:   Traci Turner, MD     Important Information About Sugar       

## 2022-12-03 ENCOUNTER — Encounter: Payer: Self-pay | Admitting: Neurology

## 2022-12-29 ENCOUNTER — Ambulatory Visit
Admission: RE | Admit: 2022-12-29 | Discharge: 2022-12-29 | Disposition: A | Discharge status: Home / Self Care | Payer: 59 | Source: Ambulatory Visit | Attending: Family Medicine | Admitting: Family Medicine

## 2022-12-29 ENCOUNTER — Other Ambulatory Visit: Payer: Self-pay | Admitting: Family Medicine

## 2022-12-29 DIAGNOSIS — Z1231 Encounter for screening mammogram for malignant neoplasm of breast: Secondary | ICD-10-CM

## 2023-01-19 ENCOUNTER — Ambulatory Visit (HOSPITAL_COMMUNITY)
Admission: RE | Admit: 2023-01-19 | Discharge: 2023-01-19 | Disposition: A | Discharge status: Home / Self Care | Payer: 59 | Source: Ambulatory Visit | Attending: Physician Assistant | Admitting: Physician Assistant

## 2023-01-19 ENCOUNTER — Encounter (HOSPITAL_COMMUNITY): Payer: Self-pay | Admitting: Physician Assistant

## 2023-01-19 VITALS — BP 116/74 | HR 57 | Ht 67.0 in | Wt 204.0 lb

## 2023-01-19 DIAGNOSIS — Z79899 Other long term (current) drug therapy: Secondary | ICD-10-CM | POA: Diagnosis not present

## 2023-01-19 DIAGNOSIS — I4892 Unspecified atrial flutter: Secondary | ICD-10-CM | POA: Insufficient documentation

## 2023-01-19 DIAGNOSIS — E039 Hypothyroidism, unspecified: Secondary | ICD-10-CM | POA: Insufficient documentation

## 2023-01-19 DIAGNOSIS — J45909 Unspecified asthma, uncomplicated: Secondary | ICD-10-CM | POA: Diagnosis not present

## 2023-01-19 DIAGNOSIS — Z79890 Hormone replacement therapy (postmenopausal): Secondary | ICD-10-CM | POA: Diagnosis not present

## 2023-01-19 DIAGNOSIS — I480 Paroxysmal atrial fibrillation: Secondary | ICD-10-CM | POA: Insufficient documentation

## 2023-01-19 NOTE — Progress Notes (Signed)
Primary Care Physician: Donald Prose, MD Primary Cardiologist: Dr Radford Pax Primary Electrophysiologist: Dr Rayann Heman Referring Physician: Dr Maren Beach Janice Flores is a 47 y.o. female with a history of asthma, hypothyroidism, and paroxysmal atrial fibrillation who presents for follow up in the [facility]. Patient reports that in February she woke in the middle of the night with palpations and SOB. She presented to the ER and was found to be in afib with RVR. She was given metoprolol on discharge and patient reports she spontaneously converted to SR later that day. She had another episode of heart racing on 04/03/19 and again went to the ER and was found to be in atrial flutter with variable conduction. Cardioversion was not pursued at that time. Patient feels that she has been persistently out of rhythm since her ER visit with nearly constant symptoms of palpitations, periodic lightheadedness, and SOB on exertion. Her Apple Watch has shown labile HR up to 140s. She denies snoring or significant alcohol use. There were no triggers during each of these episodes that the patient could identify. She was started on flecainide on 05/22/19.  On follow up today, patient reports that she has had infrequent and brief palpitations. There does not appear to be a specific trigger for her afib. She has also had a few episodes of dizziness where she feels "unbalanced". This lasts for only a few seconds and then passes. The dizziness is not associated with palpations or any other symptoms.   Today, she denies symptoms of chest pain, shortness of breath, orthopnea, PND, lower extremity edema, presyncope, syncope, snoring, daytime somnolence, bleeding, or neurologic sequela. The patient is tolerating medications without difficulties and is otherwise without complaint today.    Atrial Fibrillation Risk Factors:  she does not have symptoms or diagnosis of sleep apnea. Negative sleep  study. she does not have a history of rheumatic fever. she does not have a history of alcohol use. The patient does have a history of early familial atrial fibrillation or other arrhythmias. Mother has afib.  she has a BMI of Body mass index is 31.95 kg/m.Marland Kitchen Filed Weights   01/19/23 0934  Weight: 92.5 kg    Family History  Problem Relation Age of Onset   Hypertension Father    Hypercholesterolemia Mother    Sarcoidosis Mother    Asthma Brother    Sickle cell trait Brother    Breast cancer Neg Hx      Atrial Fibrillation Management history:  Previous antiarrhythmic drugs: flecainide Previous cardioversions: none Previous ablations: none CHADS2VASC score: 1 Anticoagulation history: Eliquis   Past Medical History:  Diagnosis Date   Anemia    Asthma    Atrial fibrillation with rapid ventricular response ([place]) 01/12/2019   Bradycardia 08/13/2016   Headache    HSV infection    Hypothyroidism    Iron deficiency    Migraine    Tachycardia    intermittent tachycardia   Tingling    Vaginal Pap smear, abnormal    colposcopy    Vitamin D deficiency    Past Surgical History:  Procedure Laterality Date   CESAREAN SECTION N/A 04/21/2015   Procedure: CESAREAN SECTION;  Surgeon: Everett Graff, MD;  Location: [facility];  Service: Obstetrics;  Laterality: N/A;   removal of polyp from uterus      Current Outpatient Medications  Medication Sig Dispense Refill   albuterol (VENTOLIN HFA) 108 (90 Base) MCG/ACT inhaler Inhale 1 puff into the lungs as directed.  Colds/Allergies     cetirizine (ZYRTEC) 10 MG chewable tablet Chew 10 mg by mouth as needed.      clobetasol ointment (TEMOVATE) [id number] % Apply 1 Application topically as directed. Eczema     flecainide (TAMBOCOR) 50 MG tablet Take 1 tablet (50 mg total) by mouth 2 (two) times daily. 180 tablet 3   levothyroxine (SYNTHROID, LEVOTHROID) 75 MCG tablet Take 75 mcg by mouth daily before breakfast.     metoprolol succinate (TOPROL-XL)  25 MG 24 hr tablet Take 0.5 tablets (12.5 mg total) by mouth daily. 45 tablet 3   Rimegepant Sulfate (NURTEC) 75 MG TBDP Take 75 mg by mouth as needed (Take 1 at onset of headache, max is 1 tablet in 24 hours). 12 tablet 11   tacrolimus (PROTOPIC) 0.1 % ointment SMARTSIG:Topical 1 to 2 Times Daily PRN     No current facility-administered medications for this encounter.    Allergies  Allergen Reactions   Other Swelling    cherries    Social History   Socioeconomic History   Marital status: Single    Spouse name: Not on file   Number of children: 1   Years of education: BS   Highest education level: Not on file  Occupational History   Occupation: Garment/textile technologist  Tobacco Use   Smoking status: Never   Smokeless tobacco: Never   Tobacco comments:    Never smoke 01/19/23  Vaping Use   Vaping Use: Never used  Substance and Sexual Activity   Alcohol use: Yes    Alcohol/week: 1.0 - 2.0 standard drink of alcohol    Types: 1 - 2 Standard drinks or equivalent per week    Comment: occasional   Drug use: No   Sexual activity: Yes  Other Topics Concern   Not on file  Social History Narrative   Lives at home with her mother and son.   Right-handed.   Occasional soda.   Social Determinants of Health   Financial Resource Strain: Not on file  Food Insecurity: Not on file  Transportation Needs: Not on file  Physical Activity: Not on file  Stress: Not on file  Social Connections: Not on file  Intimate Partner Violence: Not on file     ROS- All systems are reviewed and negative except as per the HPI above.  Physical Exam: Vitals:   01/19/23 0934  BP: 116/74  Pulse: (!) 57  Weight: 92.5 kg  Height: 5' 7"$  (1.702 m)     GEN- The patient is a well appearing female, alert and oriented x 3 today.   HEENT-head normocephalic, atraumatic, sclera clear, conjunctiva pink, hearing intact, trachea midline. Lungs- Clear to ausculation bilaterally, normal work of  breathing Heart- Regular rate and rhythm, no murmurs, rubs or gallops  GI- soft, NT, ND, + BS Extremities- no clubbing, cyanosis, or edema MS- no significant deformity or atrophy Skin- no rash or lesion Psych- euthymic mood, full affect Neuro- strength and sensation are intact   Wt Readings from Last 3 Encounters:  01/19/23 92.5 kg  09/30/22 91.5 kg  05/19/22 83.5 kg    EKG today demonstrates  SB Vent. rate 57 BPM PR interval 158 ms QRS duration 98 ms QT/QTcB 424/412 ms  Echo 01/18/19 demonstrated  1. The left ventricle has hyperdynamic systolic function, with an ejection fraction of >65%. The cavity size was normal. Left ventricular diastolic parameters were normal.  2. The right ventricle has normal systolic function. The cavity was normal. There is no increase  in right ventricular wall thickness.  3. The mitral valve is normal in structure.  4. The tricuspid valve is normal in structure.  5. The aortic valve is normal in structure. no stenosis of the aortic valve.  6. The aortic root and ascending aorta are normal in size and structure.  7. The interatrial septum was not assessed.  Epic records are reviewed at length today  CHA2DS2-VASc Score = 1  The patient's score is based upon: CHF History: 0 HTN History: 0 Diabetes History: 0 Stroke History: 0 Vascular Disease History: 0 Age Score: 0 Gender Score: 1        ASSESSMENT AND PLAN: 1. Paroxysmal Atrial Fibrillation/atrial flutter The patient's CHA2DS2-VASc score is 1, indicating a 0.6% annual risk of stroke.   Patient appears to be maintaining SR with only brief palpitations. We discussed increasing flecainide vs afib ablation. She does not feel her symptoms warrant a change at this time.  Continue flecainide 50 mg BID Continue Toprol 12.5 mg daily Anticoagulation not indicated at this time with low CV score    Follow up with Dr Theodosia Blender office per recall. AF clinic in one year.    [facility] [address] [place], [place] [address] [phone] 01/19/2023 10:28 AM

## 2023-02-09 ENCOUNTER — Telehealth: Payer: Self-pay | Admitting: Cardiology

## 2023-02-09 NOTE — Telephone Encounter (Signed)
Patient c/o Palpitations:  High priority if patient c/o lightheadedness, shortness of breath, or chest pain  How long have you had palpitations/irregular HR/ Afib? Are you having the symptoms now? Pt's mother is unsure  Are you currently experiencing lightheadedness, SOB or CP? Lightheadedness, she felt like she was going to pass out and heart rate got slow  Do you have a history of afib (atrial fibrillation) or irregular heart rhythm? Yes, she a history of Afib  Have you checked your BP or HR? (document readings if available): No  Are you experiencing any other symptoms? No  Pt is out of town and wont be back until Thursday after 6pm but the pt's mother requested to call her.

## 2023-02-09 NOTE — Telephone Encounter (Signed)
Pt mother called stating her daughter text her this morning complain of chest pain, low heart rate,and feeling lightheadedness. Asked the mother if pt is able to talk and she stated pt out of town and in meetings. I advised the mother it would be helpful to speak to the patient. Mother text the pt, the patient replied to her mother and she called the daughter on 3-way. The patient c/o chest pain off and on, lightheadedness, swollen ankles for the past few weeks, and dizziness. She doesn't monitor vitals. Pt wanted to scheduled appointment with MD. Durward Fortes pt MD does not have any available appointments for the rest of March. Also it is recommended that you seek immediate medical attention due to your symptoms. Pt replied she is being followed by A-FIB clinic. Advised pt again its recommended to seek immediate medical attention and I will forward her concerns to the A-FIB clinic. Patient voiced understanding.

## 2023-02-10 NOTE — Telephone Encounter (Signed)
Spoke with patient feeling back to baseline today. Pt states apple watch triggering alerts for low HRs and dizziness intermittently. Per Adline Peals PA will apply 2 week zio to assess for bradycardia. For atypical chest pain recommend follow up with cardiology app. Will froward to scheduling to contact pt for follow up. Pt in agreement.

## 2023-02-11 NOTE — Progress Notes (Signed)
Cardiology Clinic Note   Date: 02/12/2023 ID: Shaliyah, Jeanlouis January 08, 1976, MRN [id number]  Primary Cardiologist:  Fransico Him, MD  Patient Profile    Janice Flores is a 47 y.o. female who presents to the clinic today for evaluation of chest pain.  Past medical history significant for: Atypical chest pain. Coronary CTA 05/10/2019: Calcium score 0.  No evidence of CAD. ETT 09/28/2019: Borderline 1 mm ST depressions in the inferior leads at peak exercise appears horizontal to slightly upsloping.  Become upsloping only in early recovery.  No PVCs or VT with treadmill on flecainide. PAF/A-flutter. Echo 01/18/2019: EF 65%.  No valvular abnormalities. Event monitor 12/27/2019: Sinus bradycardia, normal sinus rhythm, sinus tachycardia, average HR 68 bpm, range 46 to 152 bpm.  Wide-complex tachycardia for 11 beats.  Rhythm strips reviewed by Dr. Radford Pax and felt to likely be A-fib with aberration. PVCs. Event monitor 08/26/2016: NSR with occasional PACs and PVCs. Hypertension. Migraine. Hypothyroidism. Iron deficiency anemia.   History of Present Illness    Janice Flores is a longtime patient of cardiology.  She is followed by Dr. Radford Pax for the above outlined history.  Patient was first evaluated by A-fib clinic, Clint Fenton, PA-C on 04/05/2019 after ED visit x 2 for A-fib with RVR.  Patient presented to the ED in February 2020 with complaints of waking in the middle of the night with palpitations and shortness of breath.   She was found to be in A-fib with RVR.  She was given metoprolol and discharged home where she later converted spontaneously to sinus rhythm.  She returned to the ED on 04/03/2019 with a 2-day history of palpitations and shortness of breath.  She was found to be in a flutter with variable conduction.  Patient failed she had been persistently out of rhythm since that visit with symptoms of constant palpitations, periodic lightheadedness, and shortness of  breath on exertion.  Apple smart watch showed labile heart rate up to 140s.  Patient was started on Eliquis with plan to begin flecainide after appropriate anticoagulation.  Metoprolol was increased for better rate control.  Patient was last seen in by Malka So, PA-C on 01/19/2023.  At that time she was maintaining sinus rhythm with brief episodes of palpitations.  With shared decision making patient was continued on flecainide and Toprol.  She is not anticoagulated secondary to CHA2DS2-VASc score of 1.  Most recently patient's mother called the office on 02/09/2023 on behalf of patient to report symptoms of chest pain.  "Pt mother called stating her daughter text her this morning complain of chest pain, low heart rate,and feeling lightheadedness. Asked the mother if pt is able to talk and she stated pt out of town and in meetings. I advised the mother it would be helpful to speak to the patient. Mother text the pt, the patient replied to her mother and she called the daughter on 3-way. The patient c/o chest pain off and on, lightheadedness, swollen ankles for the past few weeks, and dizziness. She doesn't monitor vitals. Pt wanted to scheduled appointment with MD. Durward Fortes pt MD does not have any available appointments for the rest of March. Also it is recommended that you seek immediate medical attention due to your symptoms. Pt replied she is being followed by A-FIB clinic. Advised pt again its recommended to seek immediate medical attention and I will forward her concerns to the A-FIB clinic. Patient voiced understanding."  Patient called back into the office on 02/10/2023 to reports  she was feeling back to baseline.  Clint Fenton, PA-C ordered 2-week ZIO for patient and recommended follow-up with general cardiology APP for atypical chest pain.  Today, patient confirms symptoms as above.  She reports chest tightness has been coming and going since December.  It started mid December with an episode of shortness  of breath and increased heart rate (not in A-fib) while she was on a business trip.  It lasted for part of the morning and then resolved on its own.  From that time until now she has noticed episodes of mild mid chest tightness that comes on in different nonexertional times such as sitting at her desk working or while driving.  She does not feel these episodes are associated with shortness of breath.  The last a couple of hours and then resolve on their own and then she may not have another episode for several days.  The episode that occurred 3 days ago was the most intense she has had.  She states she woke up with a migraine and then noticed the tightness across her chest.  She was not short of breath.  She states episode lasted for about 3 hours and then resolved on its own only to return later in the evening and last for another couple of hours.  She has not had any episodes since that time.  She does not relate any of these episodes of chest tightness with her usual palpitations.  She admits to a relatively sedentary lifestyle.  She does live in a two-story home and is typically able to walk up and down the stairs without any difficulty.  She has not had chest pain while walking the stairs.  She does report every once in a while she will feel little winded after going up and down the stairs.  She reports noticing some mild bilateral feet and ankle edema at the end of the day.  She states that this is unusual for her but does report she sits in a dependent position the majority of the time.  She does not monitor her dietary salt intake but states she tries to cook with little to no sodium secondary to living with her mother who has heart issues.  She denies orthopnea or PND.    ROS: All other systems reviewed and are otherwise negative except as noted in History of Present Illness.   Risk Assessment/Calculations     CHA2DS2-VASc Score = 1   This indicates a 0.6% annual risk of stroke. The patient's  score is based upon: CHF History: 0 HTN History: 0 Diabetes History: 0 Stroke History: 0 Vascular Disease History: 0 Age Score: 0 Gender Score: 1              Physical Exam    VS:  BP 120/68   Pulse 67   Ht '5\' 7"'$  (1.702 m)   Wt 206 lb 12.8 oz (93.8 kg)   LMP 01/18/2023   SpO2 100%   BMI 32.39 kg/m  , BMI Body mass index is 32.39 kg/m.  GEN: Well nourished, well developed, in no acute distress. Neck: No JVD or carotid bruits. Cardiac: RRR. No murmurs. No rubs or gallops.   Respiratory:  Respirations regular and unlabored. Clear to auscultation without rales, wheezing or rhonchi. GI: Soft, nontender, nondistended. Extremities: Radials/DP/PT 2+ and equal bilaterally. No clubbing or cyanosis. No edema.  Skin: Warm and dry, no rash. Neuro: Strength intact.  Assessment & Plan   Precordial chest pain.  Patient describes  episodes of nonexertional chest tightness that have been on and off since December.  Most significant episode was 3 days ago while she was on a business trip.  She states she awoke with a migraine and then noticed chest tightness that lasted about 3 hours before it resolved on its own.  The same pain returned later on in the evening and stayed for another couple of hours.  She has not had any episodes since.  Pain cannot be reproduced with palpation.  Patient had a coronary CTA in June 2020 which showed a calcium score of 0 and no CAD.  She also underwent an ETT in October 2020 which was normal.  Although her symptoms are atypical given her use of flecainide will get a coronary PET/CT to further evaluate.  ED precautions discussed. Ankle edema.  Patient has no edema today.  She reports that this is not typical for her and she has only noticed it a couple of times.  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  She typically cooks low-sodium meals.  I do not see a need for diuretic at this time.  She will continue to  monitor. PAF.  Event monitor January 2021 showed sinus bradycardia, normal sinus, sinus tachycardia with heart rate range 46 to 152 bpm.  Dr. Radford Pax reviewed strips and felt she was likely in A-fib with aberration.  Patient was last seen by A-fib clinic February 2024 and was maintaining sinus rhythm.  Not on anticoagulation secondary to CHA2DS2-VASc score of 1.  Patient reports occasional palpitations but no feelings of A-fib.  She has regular rate and rhythm on auscultation today.  No shortness of breath at rest or unusual fatigue.  She wears an Apple smart watch which has not alerted her to possible arrhythmia.  Continue metoprolol and flecainide. Hypertension.  BP today 120/68.  Patient denies headaches or dizziness.  Continue metoprolol.  Disposition: Coronary PET/CT.  Follow-up with Dr. Radford Pax after testing or sooner as needed.     Shared Decision Making/Informed Consent The risks [chest pain, shortness of breath, cardiac arrhythmias, dizziness, blood pressure fluctuations, myocardial infarction, stroke/transient ischemic attack, nausea, vomiting, allergic reaction, radiation exposure, metallic taste sensation and life-threatening complications (estimated to be 1 in 10,000)], benefits (risk stratification, diagnosing coronary artery disease, treatment guidance) and alternatives of a cardiac PET stress test were discussed in detail with Ms. Kreischer and she agrees to proceed.   Signed, Justice Britain. Maricella Filyaw, DNP, NP-C

## 2023-02-12 ENCOUNTER — Ambulatory Visit (INDEPENDENT_AMBULATORY_CARE_PROVIDER_SITE_OTHER): Payer: 59 | Admitting: Student

## 2023-02-12 ENCOUNTER — Encounter: Payer: Self-pay | Admitting: Student

## 2023-02-12 ENCOUNTER — Ambulatory Visit (HOSPITAL_COMMUNITY)
Admission: RE | Admit: 2023-02-12 | Discharge: 2023-02-12 | Disposition: A | Discharge status: Home / Self Care | Payer: 59 | Source: Ambulatory Visit | Attending: Physician Assistant | Admitting: Physician Assistant

## 2023-02-12 ENCOUNTER — Ambulatory Visit
Admission: RE | Admit: 2023-02-12 | Discharge: 2023-02-12 | Disposition: A | Discharge status: Home / Self Care | Payer: 59 | Source: Ambulatory Visit | Attending: Physician Assistant | Admitting: Physician Assistant

## 2023-02-12 VITALS — BP 120/68 | HR 67 | Ht 67.0 in | Wt 206.8 lb

## 2023-02-12 DIAGNOSIS — M25472 Effusion, left ankle: Secondary | ICD-10-CM

## 2023-02-12 DIAGNOSIS — M25471 Effusion, right ankle: Secondary | ICD-10-CM | POA: Diagnosis not present

## 2023-02-12 DIAGNOSIS — R42 Dizziness and giddiness: Secondary | ICD-10-CM

## 2023-02-12 DIAGNOSIS — I10 Essential (primary) hypertension: Secondary | ICD-10-CM

## 2023-02-12 DIAGNOSIS — I480 Paroxysmal atrial fibrillation: Secondary | ICD-10-CM | POA: Diagnosis not present

## 2023-02-12 DIAGNOSIS — R072 Precordial pain: Secondary | ICD-10-CM | POA: Diagnosis not present

## 2023-02-12 NOTE — Patient Instructions (Signed)
Medication Instructions:  Your physician recommends that you continue on your current medications as directed. Please refer to the Current Medication list given to you today.  *If you need a refill on your cardiac medications before your next appointment, please call your pharmacy*   Lab Work: NONE If you have labs (blood work) drawn today and your tests are completely normal, you will receive your results only by: [place] (if you have MyChart) OR A paper copy in the mail If you have any lab test that is abnormal or we need to change your treatment, we will call you to review the results.   Testing/Procedures: CARDIAC PET- Your physician has requested that you have a Cardiac Pet Stress Test. This testing is completed at [facility] ([place], [place] [address]). The schedulers will call you to get this scheduled. Please follow instructions below and call the office with any questions/concerns [phone]).    Follow-Up: At [facility], you and your health needs are our priority.  As part of our continuing mission to provide you with exceptional heart care, we have created designated Provider Care Teams.  These Care Teams include your primary Cardiologist (physician) and Advanced Practice Providers (APPs -  Physician Assistants and Nurse Practitioners) who all work together to provide you with the care you need, when you need it.  We recommend signing up for the patient portal called "MyChart".  Sign up information is provided on this After Visit Summary.  MyChart is used to connect with patients for Virtual Visits (Telemedicine).  Patients are able to view lab/test results, encounter notes, upcoming appointments, etc.  Non-urgent messages can be sent to your provider as well.   To learn more about what you can do with MyChart, go to [email or web address].    Your next appointment:   Follow up with Dr. Heron Nay after testing.    How to  Prepare for Your Cardiac PET/CT Stress Test:  1. Please do not take these medications before your test:   Medications that may interfere with the cardiac pharmacological stress agent (ex. nitrates - including erectile dysfunction medications, isosorbide mononitrate, tamulosin or beta-blockers) the day of the exam. (Erectile dysfunction medication should be held for at least 72 hrs prior to test) PLEASE HOLD METOPROLOL THE DAY OF YOUR PET SCAN  Your remaining medications may be taken with water.  2. Nothing to eat or drink, except water, 3 hours prior to arrival time.   NO caffeine/decaffeinated products, or chocolate 12 hours prior to arrival.  3. NO perfume, cologne or lotion  4. Total time is 1 to 2 hours; you may want to bring reading material for the waiting time.  5. Please report to Radiology at the [place] Entrance 30 minutes early for your test.  [place], [place] [address]   IF YOU THINK YOU MAY BE PREGNANT, OR ARE NURSING PLEASE INFORM THE TECHNOLOGIST.  In preparation for your appointment, medication and supplies will be purchased.  Appointment availability is limited, so if you need to cancel or reschedule, please call the Radiology Department at [phone]  24 hours in advance to avoid a cancellation fee of $100.00  What to Expect After you Arrive:  Once you arrive and check in for your appointment, you will be taken to a preparation room within the Radiology Department.  A technologist or Nurse will obtain your medical history, verify that you are correctly prepped for the exam, and explain  the procedure.  Afterwards,  an IV will be started in your arm and electrodes will be placed on your skin for EKG monitoring during the stress portion of the exam. Then you will be escorted to the PET/CT scanner.  There, staff will get you positioned on the scanner and obtain a blood pressure and EKG.  During the exam, you will continue to be connected  to the EKG and blood pressure machines.  A small, safe amount of a radioactive tracer will be injected in your IV to obtain a series of pictures of your heart along with an injection of a stress agent.    After your Exam:  It is recommended that you eat a meal and drink a caffeinated beverage to counter act any effects of the stress agent.  Drink plenty of fluids for the remainder of the day and urinate frequently for the first couple of hours after the exam.  Your doctor will inform you of your test results within 7-10 business days.  For questions about your test or how to prepare for your test, please call: Marchia Bond, Cardiac Imaging Nurse Navigator  Gordy Clement, Cardiac Imaging Nurse Navigator Office: [phone]

## 2023-03-08 ENCOUNTER — Other Ambulatory Visit: Payer: Self-pay | Admitting: Obstetrics and Gynecology

## 2023-03-10 NOTE — Addendum Note (Signed)
Encounter addended by: Juluis Mire, RN on: 03/10/2023 9:17 AM  Actions taken: Imaging Exam ended

## 2023-03-11 ENCOUNTER — Encounter (HOSPITAL_COMMUNITY): Payer: Self-pay | Admitting: *Deleted

## 2023-03-19 ENCOUNTER — Telehealth (HOSPITAL_COMMUNITY): Payer: Self-pay | Admitting: Emergency Medicine

## 2023-03-19 NOTE — Telephone Encounter (Signed)
Attempted to call patient regarding upcoming cardiac PET appointment. Left message on voicemail with name and callback number Meital Riehl RN Navigator Cardiac Imaging [facility] [phone] Office [phone] Cell  

## 2023-03-23 ENCOUNTER — Encounter (HOSPITAL_COMMUNITY)
Admission: RE | Admit: 2023-03-23 | Discharge: 2023-03-23 | Disposition: A | Discharge status: Home / Self Care | Payer: 59 | Source: Ambulatory Visit | Attending: Student | Admitting: Student

## 2023-03-23 DIAGNOSIS — R072 Precordial pain: Secondary | ICD-10-CM | POA: Insufficient documentation

## 2023-03-23 LAB — NM PET CT CARDIAC PERFUSION MULTI W/ABSOLUTE BLOODFLOW
MBFR: 3.81
Nuc Rest EF: 50 %
Nuc Stress EF: 65 %
Rest MBF: 0.77 ml/g/min
Rest Nuclear Isotope Dose: 24.1 mCi
ST Depression (mm): 0 mm
Stress MBF: 2.93 ml/g/min
Stress Nuclear Isotope Dose: 24.1 mCi

## 2023-03-23 MED ORDER — REGADENOSON 0.4 MG/5ML IV SOLN: 0.4000 mg | Freq: Once | INTRAVENOUS | Status: DC

## 2023-03-23 MED ORDER — CAFFEINE CITRATE BASE COMPONENT 10 MG/ML IV SOLN
INTRAVENOUS | Status: AC
  Filled 2023-03-23: qty 3

## 2023-03-23 MED ORDER — RUBIDIUM RB82 GENERATOR (RUBYFILL)
24.1000 | PACK | Freq: Once | INTRAVENOUS | Status: AC
  Administered 2023-03-23: 24.1 via INTRAVENOUS

## 2023-03-23 MED ORDER — DEXTROSE 5 % IV SOLN
INTRAVENOUS | Status: AC
  Filled 2023-03-23: qty 50

## 2023-03-23 MED ORDER — REGADENOSON 0.4 MG/5ML IV SOLN
INTRAVENOUS | Status: AC
  Filled 2023-03-23: qty 5

## 2023-03-29 ENCOUNTER — Other Ambulatory Visit (HOSPITAL_COMMUNITY): Payer: Self-pay | Admitting: Physician Assistant

## 2023-04-29 ENCOUNTER — Other Ambulatory Visit (HOSPITAL_COMMUNITY): Payer: Self-pay

## 2023-05-26 ENCOUNTER — Telehealth (INDEPENDENT_AMBULATORY_CARE_PROVIDER_SITE_OTHER): Payer: 59 | Admitting: Neurology

## 2023-05-26 DIAGNOSIS — G43709 Chronic migraine without aura, not intractable, without status migrainosus: Secondary | ICD-10-CM | POA: Diagnosis not present

## 2023-05-26 MED ORDER — NURTEC 75 MG PO TBDP: 75.0000 mg | ORAL_TABLET | ORAL | 11 refills | Status: DC | PRN

## 2023-05-26 NOTE — Patient Instructions (Signed)
I reordered Nurtec, try to use the co-pay card, we will also see if your insurance will cover.  If your migraines increase please let me know!  Thanks!

## 2023-05-26 NOTE — Progress Notes (Signed)
Virtual Visit via Video Note  I connected with Janice Flores on 05/26/23 at  2:00 PM EDT by a video enabled telemedicine application and verified that I am speaking with the correct person using two identifiers.  Location: Patient: at her home Provider: in the office    I discussed the limitations of evaluation and management by telemedicine and the availability of in person appointments. The patient expressed understanding and agreed to proceed.  History of Present Illness: Janice Flores is a 47 year old right-handed female alone at today's clinical visit, seen in refer by  her primary care physician Dr.Vyvyan Sun for evaluation of headaches on August 10, 2019, I saw her previously on November 06 2015 for migraine   I have reviewed and summarize her most recent office note, she had a history of hypothyroidism, intermittent asthma, iron deficiency anemia, vitamin D deficiency, idiopathic scoliosis of lumbar region, history of herpes simplex type II, PVCs   She started to have headaches since 2013, she reported sudden onset transient sharp headache lasting for few seconds with sudden positional change, for a while, it has improved, but since October 2016, she began to have frequent occurrence, multiple episodes in a day, usually happened with sudden positional change, turning her head, bending over, getting up from bed   In addition she also had a history of migraine headaches, above-mentioned headache is different from her typical migraine, her typical migraine left frontal behind eye severe pounding headache with associated light noise sensitivity, nauseous, lasting for 1 day,    She is currently still nursing her 83-month-old son, tried Tylenol for migraine with limited help, she was started on preventive medication magnesium oxide, riboflavin, which has been helpful, 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, no evidence of compression, headache was much improved, has  lost follow-up,   She returns today for worsening headache since March 2020   Her headache is usually triggered by strong smells, such as perfume, cleaning agent, exertion, sudden head movement, her typical headache left parietal retro-orbital region pounding headache with associated light, noise sensitivity, nauseous, ringing in ears, lasting for few hours, she also complains of intermittent left face, left finger paresthesia   She is having migraine headaches 3-4 times each week, tried Imitrex without significant improvement,   Update October 26, 2019, She is doing well, still has intermittent left lower face numbness, sometimes upper extremity paresthesia, only lasting for few minutes, no weakness,   She has migraine headaches every couple months, responding well to Imitrex   Personally reviewed MRI of the brain in September 2020, partially empty sella, no intracranial abnormality   Update November 13, 2019 SS: She was recently seen by Dr. Terrace Arabia 3 weeks ago.  She reports she has continued to do well, has had less frequent paresthesia and headaches.  Has had only had 2 migraines in September, reports excellent benefit with Imitrex.  She is working with cardiology to decrease her dose of metoprolol.  She thinks the decreased dose has resulted in less episodes of intermittent left lower face numbness, paresthesia to left upper extremity.  She is now taking metoprolol 12.5 mg daily.  The frequency of paresthesias is happening less than once a week, is much improved.  She presents today for evaluation unaccompanied.  She has not actually started nortriptyline, has not needed up to this point.   Update May 13, 2020 SS: Headaches remain overall well controlled, at worse 1-2 a month, recently last 2 weeks, has had 3 headaches,  brought on by weather change.  Headaches start under the left eye, as pressure, will gradually spread up to the top of head (once at this point is worst).  Will take Imitrex with  good benefit.  Occasionally, will get left sided facial paresthesia, numbness-not nearly as significant or frequent, previously thought to be related to higher dose metoprolol, now taking 12.5 mg daily.  Never started nortriptyline. Headaches triggered by smells. Has 29 year old son.    Update November 12, 2020 SS: Headaches remain well controlled, could go a month with no headache, then have a few days of cluster headache. Had headache last weekend 3 days, taking 2 doses of Imitrex did not relieve.  It does make her nauseated.  Known triggers are smells, but is not the case when wakes up with headache. 1 spell of A. fib since last seen.  Does not feel headaches are frequent enough to be on preventive medication.   Update May 14, 2021  SS: Headaches remain well controlled, overcast weather days, certain smells are triggers. Frequency depends on triggers. Taking Nurtec for acute headache works really well/fast, hasn't taken Imitrex at all. Nurtec filled in December # 12 tablets, only filled twice since. Migraines start left temple area, localize to left vertex when really bad. Headaches have calmed down a lot more. Is more aware of her triggers. No AFIB episodes recently.    Update May 19, 2022 SS: Doing well, last month 2 migraines, takes Nurtec, eases off within 1 hr, usually occur in AM. Sees cardiology, on metoprolol. No health issues. Her son is 7, doing well.  Not on preventative.   Update May 26, 2023 SS: via VV, more headaches, starts as sinus pressure then turns into full blown migraine. On average last few months, 3-4 migraines a month. Has not taken Nurtec, working with insurance, needs to sign up for co-pay card. Lately more migraines than accustomed to.    Observations/Objective: Via video visit, is alert and oriented, speech is clear and concise, facial symmetry noted, moves about freely  Assessment and Plan: 1.  Chronic migraine headaches  -Few more migraines lately -Reorder Nurtec 75  mg as needed for acute migraine treatment, encouraged to utilize co-pay card -She has wanted to remain on preventative possible -Previously tried and Failed: Imitrex, would not do nortriptyline due to potential interaction with flecainide, already on metoprolol.  Would like to avoid triptan medications due to potential cardiac side effects, has close follow-up with cardiology for atypical chest pain, A-fib/a flutter, PVCs, hypertension -Next steps: CGRP injectable medicine, Nurtec as preventative  Follow Up Instructions: 1 year follow-up, sooner if needed will reach out   I discussed the assessment and treatment plan with the patient. The patient was provided an opportunity to ask questions and all were answered. The patient agreed with the plan and demonstrated an understanding of the instructions.   The patient was advised to call back or seek an in-person evaluation if the symptoms worsen or if the condition fails to improve as anticipated.  Otila Kluver, DNP  [facility] [address] [place], [place] [address] [phone]

## 2023-07-28 ENCOUNTER — Other Ambulatory Visit: Payer: Self-pay

## 2023-07-28 ENCOUNTER — Emergency Department: Payer: 59

## 2023-07-28 ENCOUNTER — Encounter: Payer: Self-pay | Admitting: Emergency Medicine

## 2023-07-28 ENCOUNTER — Emergency Department
Admission: EM | Admit: 2023-07-28 | Discharge: 2023-07-28 | Disposition: A | Discharge status: Home / Self Care | Payer: 59 | Attending: Emergency Medicine | Admitting: Emergency Medicine

## 2023-07-28 DIAGNOSIS — R0789 Other chest pain: Secondary | ICD-10-CM | POA: Diagnosis present

## 2023-07-28 LAB — CBC
HCT: 36.9 % (ref 36.0–46.0)
Hemoglobin: 12 g/dL (ref 12.0–15.0)
MCH: 29.9 pg (ref 26.0–34.0)
MCHC: 32.5 g/dL (ref 30.0–36.0)
MCV: 92 fL (ref 80.0–100.0)
Platelets: 297 10*3/uL (ref 150–400)
RBC: 4.01 MIL/uL (ref 3.87–5.11)
RDW: 12.9 % (ref 11.5–15.5)
WBC: 4.5 10*3/uL (ref 4.0–10.5)
nRBC: 0 % (ref 0.0–0.2)

## 2023-07-28 LAB — BASIC METABOLIC PANEL
Anion gap: 10 (ref 5–15)
BUN: 9 mg/dL (ref 6–20)
CO2: 23 mmol/L (ref 22–32)
Calcium: 8.8 mg/dL — ABNORMAL LOW (ref 8.9–10.3)
Chloride: 101 mmol/L (ref 98–111)
Creatinine, Ser: 0.83 mg/dL (ref 0.44–1.00)
GFR, Estimated: >60 mL/min (ref >60)
Glucose, Bld: 117 mg/dL — ABNORMAL HIGH (ref 70–99)
Potassium: 3.7 mmol/L (ref 3.5–5.1)
Sodium: 134 mmol/L — ABNORMAL LOW (ref 135–145)

## 2023-07-28 LAB — POC URINE PREG, ED: Preg Test, Ur: NEGATIVE

## 2023-07-28 LAB — TROPONIN I (HIGH SENSITIVITY): Troponin I (High Sensitivity): <2 ng/L (ref <18)

## 2023-07-28 MED ORDER — CYCLOBENZAPRINE HCL 5 MG PO TABS: 5.0000 mg | ORAL_TABLET | Freq: Three times a day (TID) | ORAL | 0 refills | Status: DC | PRN

## 2023-07-28 MED ORDER — IOHEXOL 350 MG/ML SOLN
75.0000 mL | Freq: Once | INTRAVENOUS | Status: AC | PRN
  Administered 2023-07-28: 75 mL via INTRAVENOUS

## 2023-07-28 NOTE — ED Notes (Signed)
See triage notes. Patient began having chest pain yesterday. Hx of afib

## 2023-07-28 NOTE — ED Triage Notes (Signed)
Patient to ED via POV for CP that started yesterday while in the shower. Right sided, non-radiating pain that is worse when moving and taking a deep breath. Hx of afib

## 2023-07-28 NOTE — ED Provider Notes (Signed)
[facility] Provider Note    Event Date/Time   First MD Initiated Contact with Patient 07/28/23 1123     (approximate)  History   Chief Complaint: Chest Pain  HPI  Janice Flores is a 47 y.o. female with a past medical history of anemia, paroxysmal atrial fibrillation, presents to the emergency department for chest pain.  According to the patient yesterday she states she was in the shower when she bent over and developed acute onset of pain to the center of her chest.  Patient states since that has been present worse with movement sometimes with a deep breath as well.  Patient has a history of atrial fibrillation which is paroxysmal currently taking flecainide but no blood thinner.  EKG today appears to be normal sinus rhythm.  Patient denies any history of similar chest pains in the past although states she has had chest discomfort from time to time.  Physical Exam   Triage Vital Signs: ED Triage Vitals  Encounter Vitals Group     BP 07/28/23 1005 (!) 144/76     Systolic BP Percentile --      Diastolic BP Percentile --      Pulse Rate 07/28/23 1005 (!) 56     Resp 07/28/23 1005 18     Temp 07/28/23 1005 98.3 F (36.8 C)     Temp Source 07/28/23 1005 Oral     SpO2 07/28/23 1005 100 %     Weight 07/28/23 1006 195 lb (88.5 kg)     Height 07/28/23 1006 5\' 7"  (1.702 m)     Head Circumference --      Peak Flow --      Pain Score 07/28/23 1006 6     Pain Loc --      Pain Education --      Exclude from Growth Chart --     Most recent vital signs: Vitals:   07/28/23 1005  BP: (!) 144/76  Pulse: (!) 56  Resp: 18  Temp: 98.3 F (36.8 C)  SpO2: 100%    General: Awake, no distress.  CV:  Good peripheral perfusion.  Regular rate and rhythm  Resp:  Normal effort.  Equal breath sounds bilaterally.  No chest wall tenderness to palpation. Abd:  No distention.  Soft, nontender.  No rebound or guarding.  ED Results / Procedures / Treatments    EKG  EKG viewed and interpreted by myself shows a normal sinus rhythm/sinus bradycardia 55 bpm with a narrow QRS, normal axis, normal intervals with no concerning ST changes.  RADIOLOGY  I have reviewed and interpreted the chest x-ray images.  No consolidation on my evaluation   MEDICATIONS ORDERED IN ED: Medications - No data to display   IMPRESSION / MDM / ASSESSMENT AND PLAN / ED COURSE  I reviewed the triage vital signs and the nursing notes.  Patient's presentation is most consistent with acute presentation with potential threat to life or bodily function.  Patient presents to the emergency department for chest pain since yesterday after bending over.  She states the pain is worse with any type of movement or deep inspiration.  States the pain seems to start in the front of the chest and at times will radiate to her back.  No chest wall tenderness to palpation no back tenderness.  Patient's lab work today shows a reassuring chemistry normal CBC and a negative troponin.  As the patient's chest pain started yesterday and negative troponin I believe  is sufficient to rule out ACS.  Chest x-ray is clear.  However given the patient's pain worse with deep inspiration we will obtain a CTA of the chest as a precaution to rule out pulmonary embolism or other intrathoracic abnormality.  Patient agreeable to plan of care.  CT scan chest is negative for PE.  No acute finding.  Given the patient's negative workup highly suspect musculoskeletal pain to be the cause of her symptoms.  FINAL CLINICAL IMPRESSION(S) / ED DIAGNOSES   Chest pain    Note:  This document was prepared using Dragon voice recognition software and may include unintentional dictation errors.   Minna Antis, MD 07/28/23 1353

## 2023-08-14 NOTE — Progress Notes (Unsigned)
Cardiology Clinic Note   Date: 08/16/2023 ID: Evaya, Bridson 08/28/1976, MRN [id number]  Primary Cardiologist:  Armanda Magic, MD  Patient Profile    Janice Flores is a 47 y.o. female who presents to the clinic today for follow up after testing.     Past medical history significant for: Atypical chest pain. Cardiac PET/CT 03/23/2023: Normal LV perfusion.  No evidence of ischemia.  No evidence of infarction.  Normal, low risk study. PAF/A-flutter. Echo 01/18/2019: EF 65%.  No valvular abnormalities. Event monitor 12/27/2019: Sinus bradycardia, normal sinus rhythm, sinus tachycardia, average HR 68 bpm, range 46 to 152 bpm.  Wide-complex tachycardia for 11 beats.  Rhythm strips reviewed by Dr. Mayford Knife and felt to likely be A-fib with aberration. 14-day ZIO 03/10/2023: Predominant rhythm was sinus rhythm. <1% ventricular and supraventricular ectopy.  No A-fib noted.  Triggered episodes associated with sinus rhythm. PVCs. Event monitor 08/26/2016: NSR with occasional PACs and PVCs. Hypertension. Migraine. Hypothyroidism. Iron deficiency anemia.     History of Present Illness    Janice Flores is a longtime patient of cardiology.  She is followed by Dr. Mayford Knife for the above outlined history.  Patient was first evaluated by A-fib clinic, Clint Fenton, PA-C on 04/05/2019 after ED visit x 2 for A-fib with RVR.  Patient presented to the ED in February 2020 with complaints of waking in the middle of the night with palpitations and shortness of breath.   She was found to be in A-fib with RVR.  She was given metoprolol and discharged home where she later converted spontaneously to sinus rhythm.  She returned to the ED on 04/03/2019 with a 2-day history of palpitations and shortness of breath.  She was found to be in a flutter with variable conduction.  Patient failed she had been persistently out of rhythm since that visit with symptoms of constant palpitations, periodic  lightheadedness, and shortness of breath on exertion.  Apple smart watch showed labile heart rate up to 140s.  Patient was started on Eliquis with plan to begin flecainide after appropriate anticoagulation.  Metoprolol was increased for better rate control.  Patient was last seen in by Alphonzo Severance, PA-C on 01/19/2023.  At that time she was maintaining sinus rhythm with brief episodes of palpitations.  With shared decision making patient was continued on flecainide and Toprol.  She is not anticoagulated secondary to CHA2DS2-VASc score of 1.   She was last seen in the office by me on 02/12/2023 after she had contacted the office for complaints of chest pain, low heart rate, and lightheadedness while out of town on a business trip.  Janice Loa, PA-C ordered 2 weeks ZIO and instructed patient to follow-up with general cardiology for atypical chest pain.  At the time of her visit she had no further episodes of chest pain.  She is admitted to a mostly sedentary lifestyle.  14-day ZIO showed triggered episodes associated with sinus rhythm, rare ventricular or supraventricular ectopy, no A-fib.  Cardiac PET/CT was a normal low risk study.  Patient presented to the ED on 07/28/2023 with complaints of right-sided chest pain worse with moving and taking a deep breath.  EKG showed normal sinus rhythm.  CTA negative for PE.  It was felt patient's pain likely musculoskeletal in nature.  Today, patient is doing well. She has had no further chest pain since ED visit. Patient denies shortness of breath or dyspnea on exertion.  Denies lower extremity edema, orthopnea, or PND. No palpitations.  She is working on increasing her activity by using a stationary bike at home. She also recently ordered a walking pad to get more activity in throughout her work day.     ROS: All other systems reviewed and are otherwise negative except as noted in History of Present Illness.  Studies Reviewed    EKG  Interpretation Date/Time:  Monday August 16 2023 13:57:24 EDT Ventricular Rate:  58 PR Interval:  144 QRS Duration:  104 QT Interval:  424 QTC Calculation: 416 R Axis:   -67  Text Interpretation: Sinus bradycardia Left axis deviation Septal infarct (cited on or before 05-Jun-2019) When compared with ECG of 28-Jul-2023 10:03, No significant change was found Confirmed by Carlos Levering [phone]) on 08/16/2023 2:14:12 PM   Risk Assessment/Calculations     CHA2DS2-VASc Score = 1   This indicates a 0.6% annual risk of stroke. The patient's score is based upon: CHF History: 0 HTN History: 0 Diabetes History: 0 Stroke History: 0 Vascular Disease History: 0 Age Score: 0 Gender Score: 1              Physical Exam    VS:  BP 124/70   Pulse (!) 58   Ht 5\' 7"  (1.702 m)   Wt 202 lb (91.6 kg)   SpO2 100%   BMI 31.64 kg/m  , BMI Body mass index is 31.64 kg/m.  GEN: Well nourished, well developed, in no acute distress. Neck: No JVD or carotid bruits. Cardiac:  RRR. No murmurs. No rubs or gallops.   Respiratory:  Respirations regular and unlabored. Clear to auscultation without rales, wheezing or rhonchi. GI: Soft, nontender, nondistended. Extremities: Radials/DP/PT 2+ and equal bilaterally. No clubbing or cyanosis. No edema.  Skin: Warm and dry, no rash. Neuro: Strength intact.  Assessment & Plan    Atypical chest pain.  Cardiac PET/CT April 2024 was a normal low risk study. Patient was seen in the ED on 07/28/2023 for right sided chest pain. EKG was negative for ischemic changes and CTA was negative for PE. It was felt pain was musculoskeletal. She had had no further episodes. She is working on increasing her activity with a stationary bike at home. She also recently ordered a walking pad to use throughout her work day. Encouraged increasing these activities as tolerated.  PAF/a-flutter.  14-day ZIO April 2024 predominantly sinus rhythm with rare ventricular and  supraventricular ectopy, no A-fib. Patient denies palpitations. EKG today shows sinus bradycardia, 58 bpm.  Continue Toprol, flecainide. Hypertension. BP today 124/70.  Patient denies headaches, dizziness or vision changes. Continue Toprol.  Disposition: Return in 1 year or sooner as needed.          Signed, Etta Grandchild. Cloyd Ragas, DNP, NP-C

## 2023-08-16 ENCOUNTER — Encounter: Payer: Self-pay | Admitting: Student

## 2023-08-16 ENCOUNTER — Ambulatory Visit: Payer: 59 | Attending: Student | Admitting: Student

## 2023-08-16 VITALS — BP 124/70 | HR 58 | Ht 67.0 in | Wt 202.0 lb

## 2023-08-16 DIAGNOSIS — I10 Essential (primary) hypertension: Secondary | ICD-10-CM

## 2023-08-16 DIAGNOSIS — R0789 Other chest pain: Secondary | ICD-10-CM

## 2023-08-16 DIAGNOSIS — I480 Paroxysmal atrial fibrillation: Secondary | ICD-10-CM

## 2023-08-16 NOTE — Patient Instructions (Signed)
Medication Instructions:  No changes on medication on today's encounter *If you need a refill on your cardiac medications before your next appointment, please call your pharmacy*   Lab Work: No labs ordered on today's encounter If you have labs (blood work) drawn today and your tests are completely normal, you will receive your results only by: MyChart Message (if you have MyChart) OR A paper copy in the mail If you have any lab test that is abnormal or we need to change your treatment, we will call you to review the results.   Testing/Procedures: No testing/procedures ordered on today's encounter   Follow-Up: At [facility], you and your health needs are our priority.  As part of our continuing mission to provide you with exceptional heart care, we have created designated Provider Care Teams.  These Care Teams include your primary Cardiologist (physician) and Advanced Practice Providers (APPs -  Physician Assistants and Nurse Practitioners) who all work together to provide you with the care you need, when you need it.  We recommend signing up for the patient portal called "MyChart".  Sign up information is provided on this After Visit Summary.  MyChart is used to connect with patients for Virtual Visits (Telemedicine).  Patients are able to view lab/test results, encounter notes, upcoming appointments, etc.  Non-urgent messages can be sent to your provider as well.   To learn more about what you can do with MyChart, go to [email or web address].    Your next appointment:   1 year(s)  Provider:   Armanda Magic, MD

## 2023-09-08 ENCOUNTER — Other Ambulatory Visit (HOSPITAL_COMMUNITY): Payer: Self-pay | Admitting: Physician Assistant

## 2023-09-08 ENCOUNTER — Telehealth: Payer: Self-pay

## 2023-09-08 NOTE — Telephone Encounter (Signed)
*  [facility]  Pharmacy Patient Advocate Encounter   Received notification from CoverMyMeds that prior authorization for Nurtec 75MG  dispersible tablets  is required/requested.   Insurance verification completed.   The patient is insured through [facility] .   Per test claim: PA required; PA submitted to [employer] [facility] via CoverMyMeds Key/confirmation #/EOC [id number] Status is pending

## 2023-09-17 NOTE — Telephone Encounter (Signed)
Pharmacy Patient Advocate Encounter  Received notification from [employer] [facility] that Prior Authorization for Nurtec 75MG  dispersible tablets has been APPROVED from 09/08/2023 to 09/06/2024   PA #/Case ID/Reference #: PA Case ID #: [id number]

## 2024-01-17 ENCOUNTER — Other Ambulatory Visit (HOSPITAL_COMMUNITY): Payer: Self-pay

## 2024-01-17 ENCOUNTER — Telehealth: Payer: Self-pay | Admitting: Pharmacist

## 2024-01-17 NOTE — Telephone Encounter (Signed)
 Pharmacy Patient Advocate Encounter   Received notification from CoverMyMeds that prior authorization for Nurtec 75MG  dispersible tablets is required/requested.   Insurance verification completed.   The patient is insured through [facility] .   Per test claim: PA required; PA submitted to above mentioned insurance via CoverMyMeds Key/confirmation #/EOC [id number] Status is pending

## 2024-01-17 NOTE — Telephone Encounter (Signed)
 Pharmacy Patient Advocate Encounter  Received notification from [facility] that Prior Authorization for Nurtec 75MG  dispersible tablets has been APPROVED from 01/17/2024 to 04/15/2024. Ran test claim, Copay is $0.00. This test claim was processed through [facility]- copay amounts may vary at other pharmacies due to pharmacy/plan contracts, or as the patient moves through the different stages of their insurance plan.   PA #/Case ID/Reference #: [id number]

## 2024-02-16 ENCOUNTER — Ambulatory Visit
Admission: RE | Admit: 2024-02-16 | Discharge: 2024-02-16 | Disposition: A | Discharge status: Home / Self Care | Source: Ambulatory Visit | Attending: Family Medicine | Admitting: Family Medicine

## 2024-02-16 ENCOUNTER — Other Ambulatory Visit: Payer: Self-pay | Admitting: Family Medicine

## 2024-02-16 DIAGNOSIS — Z1231 Encounter for screening mammogram for malignant neoplasm of breast: Secondary | ICD-10-CM

## 2024-02-27 ENCOUNTER — Other Ambulatory Visit (HOSPITAL_COMMUNITY): Payer: Self-pay | Admitting: Physician Assistant

## 2024-03-17 ENCOUNTER — Telehealth (HOSPITAL_COMMUNITY): Payer: Self-pay | Admitting: *Deleted

## 2024-03-17 NOTE — Telephone Encounter (Signed)
 Patient called in stating she went into afib this morning HR 100-150s. Discussed with Jorja Loa PA will take extra 50mg  of flecainide at lunch time and an extra 1/2 tablet of metoprolol this evening. If afib persists she will call for assessment.

## 2024-03-21 ENCOUNTER — Telehealth: Payer: Self-pay

## 2024-03-21 ENCOUNTER — Other Ambulatory Visit (HOSPITAL_COMMUNITY): Payer: Self-pay

## 2024-03-21 NOTE — Telephone Encounter (Signed)
 Pharmacy Patient Advocate Encounter   Received notification from CoverMyMeds that prior authorization for Nurtec 75MG  dispersible tablets is required/requested.   Insurance verification completed.   The patient is insured through [facility] .   Per test claim: PA required; PA submitted to above mentioned insurance via CoverMyMeds Key/confirmation #/EOC [id number] Status is pending

## 2024-03-21 NOTE — Telephone Encounter (Signed)
 Pharmacy Patient Advocate Encounter  Received notification from [facility] that Prior Authorization for Nurtec 75MG  dispersible tablets has been APPROVED from 03/21/2024 to 03/21/2025. Ran test claim, Copay is $0. This test claim was processed through [facility]- copay amounts may vary at other pharmacies due to pharmacy/plan contracts, or as the patient moves through the different stages of their insurance plan.   PA #/Case ID/Reference #: PA Case ID #: [id number]

## 2024-03-24 ENCOUNTER — Other Ambulatory Visit (HOSPITAL_COMMUNITY): Payer: Self-pay | Admitting: Physician Assistant

## 2024-05-21 NOTE — Progress Notes (Unsigned)
 Virtual Visit via Video Note  I connected with Janice Flores on 05/22/24 at  1:45 PM EDT by a video enabled telemedicine application and verified that I am speaking with the correct person using two identifiers.  Location: Patient: at her home Provider: in the office    I discussed the limitations of evaluation and management by telemedicine and the availability of in person appointments. The patient expressed understanding and agreed to proceed.  History of Present Illness: Janice Flores is a 48 year old right-handed female alone at today's clinical visit, seen in refer by  her primary care physician Dr.Vyvyan Sun for evaluation of headaches on August 10, 2019, I saw her previously on November 06 2015 for migraine   I have reviewed and summarize her most recent office note, she had a history of hypothyroidism, intermittent asthma, iron deficiency anemia, vitamin D deficiency, idiopathic scoliosis of lumbar region, history of herpes simplex type II, PVCs   She started to have headaches since 2013, she reported sudden onset transient sharp headache lasting for few seconds with sudden positional change, for a while, it has improved, but since October 2016, she began to have frequent occurrence, multiple episodes in a day, usually happened with sudden positional change, turning her head, bending over, getting up from bed   In addition she also had a history of migraine headaches, above-mentioned headache is different from her typical migraine, her typical migraine left frontal behind eye severe pounding headache with associated light noise sensitivity, nauseous, lasting for 1 day,    She is currently still nursing her 39-month-old son, tried Tylenol  for migraine with limited help, she was started on preventive medication magnesium oxide, riboflavin, which has been helpful, 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, no evidence of compression, headache was much improved, has  lost follow-up,   She returns today for worsening headache since March 2020   Her headache is usually triggered by strong smells, such as perfume, cleaning agent, exertion, sudden head movement, her typical headache left parietal retro-orbital region pounding headache with associated light, noise sensitivity, nauseous, ringing in ears, lasting for few hours, she also complains of intermittent left face, left finger paresthesia   She is having migraine headaches 3-4 times each week, tried Imitrex  without significant improvement,   Update October 26, 2019, She is doing well, still has intermittent left lower face numbness, sometimes upper extremity paresthesia, only lasting for few minutes, no weakness,   She has migraine headaches every couple months, responding well to Imitrex    Personally reviewed MRI of the brain in September 2020, partially empty sella, no intracranial abnormality   Update November 13, 2019 SS: She was recently seen by Dr. Gracie Lav 3 weeks ago.  She reports she has continued to do well, has had less frequent paresthesia and headaches.  Has had only had 2 migraines in September, reports excellent benefit with Imitrex .  She is working with cardiology to decrease her dose of metoprolol .  She thinks the decreased dose has resulted in less episodes of intermittent left lower face numbness, paresthesia to left upper extremity.  She is now taking metoprolol  12.5 mg daily.  The frequency of paresthesias is happening less than once a week, is much improved.  She presents today for evaluation unaccompanied.  She has not actually started nortriptyline , has not needed up to this point.   Update May 13, 2020 SS: Headaches remain overall well controlled, at worse 1-2 a month, recently last 2 weeks, has had 3 headaches,  brought on by weather change.  Headaches start under the left eye, as pressure, will gradually spread up to the top of head (once at this point is worst).  Will take Imitrex  with  good benefit.  Occasionally, will get left sided facial paresthesia, numbness-not nearly as significant or frequent, previously thought to be related to higher dose metoprolol , now taking 12.5 mg daily.  Never started nortriptyline . Headaches triggered by smells. Has 81 year old son.    Update November 12, 2020 SS: Headaches remain well controlled, could go a month with no headache, then have a few days of cluster headache. Had headache last weekend 3 days, taking 2 doses of Imitrex  did not relieve.  It does make her nauseated.  Known triggers are smells, but is not the case when wakes up with headache. 1 spell of A. fib since last seen.  Does not feel headaches are frequent enough to be on preventive medication.   Update May 14, 2021  SS: Headaches remain well controlled, overcast weather days, certain smells are triggers. Frequency depends on triggers. Taking Nurtec for acute headache works really well/fast, hasn't taken Imitrex  at all. Nurtec filled in December # 12 tablets, only filled twice since. Migraines start left temple area, localize to left vertex when really bad. Headaches have calmed down a lot more. Is more aware of her triggers. No AFIB episodes recently.    Update May 19, 2022 SS: Doing well, last month 2 migraines, takes Nurtec, eases off within 1 hr, usually occur in AM. Sees cardiology, on metoprolol . No health issues. Her son is 7, doing well.  Not on preventative.   Update May 26, 2023 SS: via VV, more headaches, starts as sinus pressure then turns into full blown migraine. On average last few months, 3-4 migraines a month. Has not taken Nurtec, working with insurance, needs to sign up for co-pay card. Lately more migraines than accustomed to.   Update May 22, 2024 SS: Taking Nurtec PRN. Insurance covered. Takes at early onset of migraine with great benefit. Has been having more waking up migraine, thinks related to sinus, weather change. Gets # 8 tablets, doesn't take all every  month. Works great for her. Right now pleased with migraine control. Sees eye doctor, told normal. Has normal HST in 2021. Hot flashes at night, attributes to perimenopause. Can have 2-3 migraines then have weeks with none.    Observations/Objective: Via video visit, is alert and oriented, speech is clear and concise, facial symmetry noted, moves about freely  Assessment and Plan: 1.  Chronic migraine headaches  - Doing overall well, migraine pattern varies, 2-3 a week, then go weeks without migraine or could be 1-2 a week.  Very pleased with Nurtec. Doesn't take all # 8 tablets monthly  - Continue Nurtec 75 mg tablet as needed for acute migraine - Wishes to remain off preventative medication if possible -Next steps: Increase Nurtec # 16 tablets monthly for prevention, CGRP injectable - Previously tried and Failed: Imitrex , would not do nortriptyline  due to potential interaction with flecainide , already on metoprolol .  Would like to avoid triptan medications due to potential cardiac side effects, has close follow-up with cardiology for atypical chest pain, A-fib/a flutter, PVCs, hypertension. In 2021 has HST that was normal.   Follow Up Instructions: 1 year follow-up VV  I discussed the assessment and treatment plan with the patient. The patient was provided an opportunity to ask questions and all were answered. The patient agreed with the plan and demonstrated  an understanding of the instructions.   The patient was advised to call back or seek an in-person evaluation if the symptoms worsen or if the condition fails to improve as anticipated.  Cortland Ding, DNP  [facility] [address], [address] [place], [place] [address] [phone]

## 2024-05-22 ENCOUNTER — Telehealth: Admitting: Neurology

## 2024-05-22 DIAGNOSIS — G43709 Chronic migraine without aura, not intractable, without status migrainosus: Secondary | ICD-10-CM | POA: Diagnosis not present

## 2024-05-22 MED ORDER — NURTEC 75 MG PO TBDP: 75.0000 mg | ORAL_TABLET | ORAL | 11 refills | Status: AC | PRN

## 2024-05-22 NOTE — Patient Instructions (Signed)
 Great to see you today.  Continue Nurtec as needed.  If your migraines increase and like to consider preventative medication please let me know.  Follow-up in 1 year.  Thanks!!

## 2024-05-30 ENCOUNTER — Telehealth: Payer: 59 | Admitting: Neurology

## 2024-08-18 ENCOUNTER — Encounter: Payer: Self-pay | Admitting: Cardiology

## 2024-08-28 ENCOUNTER — Other Ambulatory Visit (HOSPITAL_COMMUNITY): Payer: Self-pay

## 2024-08-28 ENCOUNTER — Other Ambulatory Visit (HOSPITAL_COMMUNITY): Payer: Self-pay | Admitting: *Deleted

## 2024-08-28 MED ORDER — METOPROLOL SUCCINATE ER 25 MG PO TB24: 12.5000 mg | ORAL_TABLET | Freq: Every day | ORAL | 0 refills | Status: DC

## 2024-09-12 ENCOUNTER — Ambulatory Visit (HOSPITAL_COMMUNITY)
Admission: RE | Admit: 2024-09-12 | Discharge: 2024-09-12 | Disposition: A | Discharge status: Home / Self Care | Source: Ambulatory Visit | Attending: Physician Assistant | Admitting: Physician Assistant

## 2024-09-12 VITALS — BP 138/70 | HR 53 | Ht 67.0 in | Wt 204.2 lb

## 2024-09-12 DIAGNOSIS — Z5181 Encounter for therapeutic drug level monitoring: Secondary | ICD-10-CM | POA: Diagnosis not present

## 2024-09-12 DIAGNOSIS — I4891 Unspecified atrial fibrillation: Secondary | ICD-10-CM

## 2024-09-12 DIAGNOSIS — I480 Paroxysmal atrial fibrillation: Secondary | ICD-10-CM

## 2024-09-12 DIAGNOSIS — Z79899 Other long term (current) drug therapy: Secondary | ICD-10-CM

## 2024-09-12 MED ORDER — FLECAINIDE ACETATE 50 MG PO TABS: 50.0000 mg | ORAL_TABLET | Freq: Two times a day (BID) | ORAL | 1 refills | Status: AC

## 2024-09-12 MED ORDER — METOPROLOL SUCCINATE ER 25 MG PO TB24: 12.5000 mg | ORAL_TABLET | Freq: Every day | ORAL | 1 refills | Status: AC

## 2024-09-12 NOTE — Progress Notes (Signed)
 Primary Care Physician: Sun, Vyvyan, MD Primary Cardiologist: Dr Shlomo Primary Electrophysiologist: Dr Kelsie (previously)  Referring Physician: Dr Debera Brooklyn Danette Buster is a 48 y.o. female with a history of asthma, hypothyroidism, and paroxysmal atrial fibrillation who presents for follow up in the [facility]. Patient reports that in February she woke in the middle of the night with palpations and SOB. She presented to the ER and was found to be in afib with RVR. She was given metoprolol  on discharge and patient reports she spontaneously converted to SR later that day. She had another episode of heart racing on 04/03/19 and again went to the ER and was found to be in atrial flutter with variable conduction. Cardioversion was not pursued at that time. Patient feels that she has been persistently out of rhythm since her ER visit with nearly constant symptoms of palpitations, periodic lightheadedness, and SOB on exertion. Her Apple Watch has shown labile HR up to 140s. She denies snoring or significant alcohol use. There were no triggers during each of these episodes that the patient could identify. She was started on flecainide  on 05/22/19.  Patient returns for follow up for atrial fibrillation. She reports that she has done well since her last visit. She did have one episode of afib which lasted only ~10 seconds. There were no specific triggers that she could identify.   Today, she  denies symptoms of chest pain, shortness of breath, orthopnea, PND, lower extremity edema, dizziness, presyncope, syncope, snoring, daytime somnolence, bleeding, or neurologic sequela. The patient is tolerating medications without difficulties and is otherwise without complaint today.    Atrial Fibrillation Risk Factors:  she does not have symptoms or diagnosis of sleep apnea. Negative sleep study. she does not have a history of rheumatic fever. she does not have a history of  alcohol use. The patient does have a history of early familial atrial fibrillation or other arrhythmias. Mother has afib.   Atrial Fibrillation Management history:  Previous antiarrhythmic drugs: flecainide  Previous cardioversions: none Previous ablations: none Anticoagulation history: Eliquis    Past Medical History:  Diagnosis Date   Anemia    Asthma    Atrial fibrillation with rapid ventricular response (HCC) 01/12/2019   Bradycardia 08/13/2016   Headache    HSV infection    Hypothyroidism    Iron deficiency    Migraine    Tachycardia    intermittent tachycardia   Tingling    Vaginal Pap smear, abnormal    colposcopy    Vitamin D deficiency      Current Outpatient Medications  Medication Sig Dispense Refill   albuterol  (VENTOLIN  HFA) 108 (90 Base) MCG/ACT inhaler Inhale 1 puff into the lungs as directed. Colds/Allergies (Patient taking differently: Inhale 1 puff into the lungs as needed. Colds/Allergies)     cetirizine (ZYRTEC) 10 MG chewable tablet Chew 10 mg by mouth as needed.      clobetasol ointment (TEMOVATE) 0.05 % Apply 1 Application topically as directed. Eczema     flecainide  (TAMBOCOR ) 50 MG tablet TAKE 1 TABLET BY MOUTH TWICE A DAY 180 tablet 1   levothyroxine  (SYNTHROID , LEVOTHROID) 75 MCG tablet Take 75 mcg by mouth daily before breakfast.     metoprolol  succinate (TOPROL -XL) 25 MG 24 hr tablet Take 0.5 tablets (12.5 mg total) by mouth daily. Appt for refills 15 tablet 0   Rimegepant Sulfate (NURTEC) 75 MG TBDP Take 1 tablet (75 mg total) by mouth as needed (Take 1 at  onset of headache, max is 1 tablet in 24 hours). 8 tablet 11   tacrolimus (PROTOPIC) 0.1 % ointment SMARTSIG:Topical 1 to 2 Times Daily PRN     No current facility-administered medications for this encounter.    ROS- All systems are reviewed and negative except as per the HPI above.  Physical Exam: Vitals:   09/12/24 0912  BP: 138/70  Pulse: (!) 53  Weight: 92.6 kg  Height: 5' 7 (1.702  m)    GEN: Well nourished, well developed in no acute distress CARDIAC: Regular rate and rhythm, no murmurs, rubs, gallops RESPIRATORY:  Clear to auscultation without rales, wheezing or rhonchi  ABDOMEN: Soft, non-tender, non-distended EXTREMITIES:  No edema; No deformity    Wt Readings from Last 3 Encounters:  09/12/24 92.6 kg  08/16/23 91.6 kg  07/28/23 88.5 kg    EKG today demonstrates  SB Vent. rate 53 BPM PR interval 168 ms QRS duration 96 ms QT/QTcB 430/403 ms   Echo 01/18/19 demonstrated  1. The left ventricle has hyperdynamic systolic function, with an ejection fraction of >65%. The cavity size was normal. Left ventricular diastolic parameters were normal.  2. The right ventricle has normal systolic function. The cavity was normal. There is no increase in right ventricular wall thickness.  3. The mitral valve is normal in structure.  4. The tricuspid valve is normal in structure.  5. The aortic valve is normal in structure. no stenosis of the aortic valve.  6. The aortic root and ascending aorta are normal in size and structure.  7. The interatrial septum was not assessed.  Epic records are reviewed at length today  CHA2DS2-VASc Score = 1  The patient's score is based upon: CHF History: 0 HTN History: 0 Diabetes History: 0 Stroke History: 0 Vascular Disease History: 0 Age Score: 0 Gender Score: 1       ASSESSMENT AND PLAN: Paroxysmal Atrial Fibrillation/atrial flutter (ICD10:  I48.0) The patient's CHA2DS2-VASc score is 1, indicating a 0.6% annual risk of stroke.   Patient appears to be maintaining SR with very few palpitations.  We discussed afib ablation in the future if she has more frequent afib symptoms.  Continue flecainide  50 mg BID Continue Toprol  12.5 mg daily Not currently on anticoagulation with low CV score.   High Risk Medication Monitoring (ICD 10: [id number]) Patient requires ongoing monitoring for anti-arrhythmic medication which has the  potential to cause life threatening arrhythmias. Intervals on ECG acceptable for flecainide  monitoring.       Follow up in the AF clinic in 6 months.    Daril Kicks PA-C [facility] [address] [place], [place] [address] [phone] 09/12/2024 9:24 AM

## 2025-03-13 ENCOUNTER — Ambulatory Visit (HOSPITAL_COMMUNITY): Admitting: Physician Assistant

## 2025-05-16 ENCOUNTER — Telehealth: Admitting: Neurology
